# Patient Record
Sex: Male | Born: 1980 | Race: White | Hispanic: No | Marital: Single | Smoking: Current some day smoker
Health system: Southern US, Community
[De-identification: ages and names within clinical notes are randomized; demographics above are authoritative.]

## PROBLEM LIST (undated history)

## (undated) DIAGNOSIS — Z87442 Personal history of urinary calculi: Secondary | ICD-10-CM

## (undated) DIAGNOSIS — D649 Anemia, unspecified: Secondary | ICD-10-CM

## (undated) DIAGNOSIS — F1120 Opioid dependence, uncomplicated: Secondary | ICD-10-CM

## (undated) DIAGNOSIS — N200 Calculus of kidney: Secondary | ICD-10-CM

## (undated) DIAGNOSIS — F419 Anxiety disorder, unspecified: Secondary | ICD-10-CM

## (undated) DIAGNOSIS — M1990 Unspecified osteoarthritis, unspecified site: Secondary | ICD-10-CM

## (undated) DIAGNOSIS — F32A Depression, unspecified: Secondary | ICD-10-CM

## (undated) DIAGNOSIS — B1920 Unspecified viral hepatitis C without hepatic coma: Secondary | ICD-10-CM

## (undated) DIAGNOSIS — F329 Major depressive disorder, single episode, unspecified: Secondary | ICD-10-CM

## (undated) DIAGNOSIS — C801 Malignant (primary) neoplasm, unspecified: Secondary | ICD-10-CM

## (undated) DIAGNOSIS — R569 Unspecified convulsions: Secondary | ICD-10-CM

## (undated) HISTORY — DX: Malignant (primary) neoplasm, unspecified (CMS-HCC): C80.1

## (undated) HISTORY — DX: Anxiety disorder, unspecified: F41.9

## (undated) HISTORY — PX: OTHER SURGICAL HISTORY: SHX169

## (undated) HISTORY — DX: Unspecified viral hepatitis C without hepatic coma: B19.20

## (undated) HISTORY — PX: TONSILLECTOMY: SUR1361

## (undated) HISTORY — PX: APPENDECTOMY: SHX54

## (undated) HISTORY — DX: Opioid dependence, uncomplicated: F11.20

## (undated) HISTORY — PX: RADICAL NECK DISSECTION: SHX2284

## (undated) HISTORY — DX: Anemia, unspecified: D64.9

## (undated) HISTORY — DX: Unspecified osteoarthritis, unspecified site: M19.90

## (undated) HISTORY — DX: Depression, unspecified: F32.A

## (undated) HISTORY — DX: Major depressive disorder, single episode, unspecified: F32.9

---

## 2009-06-12 ENCOUNTER — Emergency Department (HOSPITAL_COMMUNITY): Admission: EM | Admit: 2009-06-12 | Discharge: 2009-06-12 | Payer: Self-pay | Admitting: Family Medicine

## 2009-07-13 ENCOUNTER — Emergency Department (HOSPITAL_COMMUNITY): Admission: EM | Admit: 2009-07-13 | Discharge: 2009-07-13 | Payer: Self-pay | Admitting: Emergency Medicine

## 2010-02-11 ENCOUNTER — Ambulatory Visit (HOSPITAL_COMMUNITY): Admission: RE | Admit: 2010-02-11 | Discharge: 2010-02-11 | Payer: Self-pay | Admitting: Orthopedic Surgery

## 2010-06-27 ENCOUNTER — Inpatient Hospital Stay (HOSPITAL_COMMUNITY): Admit: 2010-06-27 | Payer: Self-pay

## 2010-07-01 ENCOUNTER — Emergency Department (HOSPITAL_COMMUNITY)
Admission: EM | Admit: 2010-07-01 | Discharge: 2010-07-02 | Disposition: A | Discharge status: Other Acute Inpatient Hospital | Payer: Self-pay | Attending: Emergency Medicine | Admitting: Emergency Medicine

## 2010-07-01 DIAGNOSIS — F3289 Other specified depressive episodes: Secondary | ICD-10-CM | POA: Insufficient documentation

## 2010-07-01 DIAGNOSIS — F329 Major depressive disorder, single episode, unspecified: Secondary | ICD-10-CM | POA: Insufficient documentation

## 2010-07-01 DIAGNOSIS — R45851 Suicidal ideations: Secondary | ICD-10-CM | POA: Insufficient documentation

## 2010-07-01 LAB — CBC
MCHC: 35.9 g/dL (ref 30.0–36.0)
Platelets: 293 10*3/uL (ref 150–400)
RDW: 11.9 % (ref 11.5–15.5)
WBC: 11.9 10*3/uL — ABNORMAL HIGH (ref 4.0–10.5)

## 2010-07-01 LAB — RAPID URINE DRUG SCREEN, HOSP PERFORMED
Amphetamines: NOT DETECTED
Barbiturates: NOT DETECTED
Benzodiazepines: POSITIVE — AB
Cocaine: POSITIVE — AB
Opiates: NOT DETECTED
Tetrahydrocannabinol: NOT DETECTED

## 2010-07-01 LAB — BASIC METABOLIC PANEL
CO2: 25 mEq/L (ref 19–32)
Calcium: 9 mg/dL (ref 8.4–10.5)
Creatinine, Ser: 1.02 mg/dL (ref 0.4–1.5)
GFR calc Af Amer: >60 mL/min (ref >60)
GFR calc non Af Amer: >60 mL/min (ref >60)
Glucose, Bld: 100 mg/dL — ABNORMAL HIGH (ref 70–99)
Sodium: 139 mEq/L (ref 135–145)

## 2010-07-01 LAB — DIFFERENTIAL
Basophils Absolute: 0 10*3/uL (ref 0.0–0.1)
Eosinophils Absolute: 0 10*3/uL (ref 0.0–0.7)
Eosinophils Relative: 0 % (ref 0–5)
Monocytes Absolute: 0.6 10*3/uL (ref 0.1–1.0)

## 2010-07-02 ENCOUNTER — Inpatient Hospital Stay (HOSPITAL_COMMUNITY)
Admission: RE | Admit: 2010-07-02 | Discharge: 2010-07-05 | DRG: 897 | Disposition: A | Discharge status: Home / Self Care | Payer: PRIVATE HEALTH INSURANCE | Source: Ambulatory Visit | Attending: Psychiatry | Admitting: Psychiatry

## 2010-07-02 DIAGNOSIS — Z6379 Other stressful life events affecting family and household: Secondary | ICD-10-CM

## 2010-07-02 DIAGNOSIS — F329 Major depressive disorder, single episode, unspecified: Secondary | ICD-10-CM

## 2010-07-02 DIAGNOSIS — Z818 Family history of other mental and behavioral disorders: Secondary | ICD-10-CM

## 2010-07-02 DIAGNOSIS — F1420 Cocaine dependence, uncomplicated: Principal | ICD-10-CM

## 2010-07-02 DIAGNOSIS — F0630 Mood disorder due to known physiological condition, unspecified: Secondary | ICD-10-CM

## 2010-07-02 DIAGNOSIS — B1920 Unspecified viral hepatitis C without hepatic coma: Secondary | ICD-10-CM

## 2010-07-02 DIAGNOSIS — F1920 Other psychoactive substance dependence, uncomplicated: Secondary | ICD-10-CM

## 2010-07-02 DIAGNOSIS — F3289 Other specified depressive episodes: Secondary | ICD-10-CM

## 2010-07-02 DIAGNOSIS — F1910 Other psychoactive substance abuse, uncomplicated: Secondary | ICD-10-CM

## 2010-07-03 LAB — HEPATIC FUNCTION PANEL
AST: 32 U/L (ref 0–37)
Albumin: 3.7 g/dL (ref 3.5–5.2)
Bilirubin, Direct: 0.1 mg/dL (ref 0.0–0.3)
Total Bilirubin: 0.2 mg/dL — ABNORMAL LOW (ref 0.3–1.2)
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
## 2010-07-21 ENCOUNTER — Emergency Department (HOSPITAL_COMMUNITY)
Admission: EM | Admit: 2010-07-21 | Discharge: 2010-07-22 | Disposition: A | Discharge status: Other Acute Inpatient Hospital | Payer: PRIVATE HEALTH INSURANCE | Attending: Emergency Medicine | Admitting: Emergency Medicine

## 2010-07-21 DIAGNOSIS — F1910 Other psychoactive substance abuse, uncomplicated: Secondary | ICD-10-CM | POA: Insufficient documentation

## 2010-07-21 DIAGNOSIS — R45851 Suicidal ideations: Secondary | ICD-10-CM | POA: Insufficient documentation

## 2010-07-21 DIAGNOSIS — Z8619 Personal history of other infectious and parasitic diseases: Secondary | ICD-10-CM | POA: Insufficient documentation

## 2010-07-21 LAB — RAPID URINE DRUG SCREEN, HOSP PERFORMED
Amphetamines: POSITIVE — AB
Barbiturates: NOT DETECTED
Benzodiazepines: POSITIVE — AB
Cocaine: POSITIVE — AB
Opiates: NOT DETECTED
Tetrahydrocannabinol: NOT DETECTED

## 2010-07-21 LAB — CBC
HCT: 40.1 % (ref 39.0–52.0)
Hemoglobin: 14.1 g/dL (ref 13.0–17.0)
RBC: 4.59 MIL/uL (ref 4.22–5.81)
WBC: 7.4 10*3/uL (ref 4.0–10.5)

## 2010-07-21 LAB — COMPREHENSIVE METABOLIC PANEL
Albumin: 3.9 g/dL (ref 3.5–5.2)
Alkaline Phosphatase: 35 U/L — ABNORMAL LOW (ref 39–117)
BUN: 14 mg/dL (ref 6–23)
Calcium: 8.8 mg/dL (ref 8.4–10.5)
Creatinine, Ser: 1.32 mg/dL (ref 0.4–1.5)
Glucose, Bld: 85 mg/dL (ref 70–99)
Potassium: 4.1 mEq/L (ref 3.5–5.1)
Total Protein: 6.8 g/dL (ref 6.0–8.3)

## 2010-07-21 LAB — DIFFERENTIAL
Basophils Absolute: 0 10*3/uL (ref 0.0–0.1)
Basophils Relative: 1 % (ref 0–1)
Lymphocytes Relative: 24 % (ref 12–46)
Monocytes Absolute: 1.5 10*3/uL — ABNORMAL HIGH (ref 0.1–1.0)
Neutro Abs: 3.9 10*3/uL (ref 1.7–7.7)
Neutrophils Relative %: 53 % (ref 43–77)

## 2010-07-21 LAB — ETHANOL: Alcohol, Ethyl (B): <5 mg/dL (ref 0–10)

## 2010-07-22 ENCOUNTER — Inpatient Hospital Stay (HOSPITAL_COMMUNITY)
Admission: EM | Admit: 2010-07-22 | Discharge: 2010-07-25 | DRG: 897 | Disposition: A | Discharge status: Home / Self Care | Payer: PRIVATE HEALTH INSURANCE | Source: Intra-hospital | Attending: Psychiatry | Admitting: Psychiatry

## 2010-07-22 DIAGNOSIS — F603 Borderline personality disorder: Secondary | ICD-10-CM

## 2010-07-22 DIAGNOSIS — F1920 Other psychoactive substance dependence, uncomplicated: Secondary | ICD-10-CM

## 2010-07-22 DIAGNOSIS — F3289 Other specified depressive episodes: Secondary | ICD-10-CM

## 2010-07-22 DIAGNOSIS — Z818 Family history of other mental and behavioral disorders: Secondary | ICD-10-CM

## 2010-07-22 DIAGNOSIS — B1920 Unspecified viral hepatitis C without hepatic coma: Secondary | ICD-10-CM

## 2010-07-22 DIAGNOSIS — F1910 Other psychoactive substance abuse, uncomplicated: Secondary | ICD-10-CM

## 2010-07-22 DIAGNOSIS — R45851 Suicidal ideations: Secondary | ICD-10-CM

## 2010-07-22 DIAGNOSIS — F332 Major depressive disorder, recurrent severe without psychotic features: Secondary | ICD-10-CM

## 2010-07-22 DIAGNOSIS — F329 Major depressive disorder, single episode, unspecified: Secondary | ICD-10-CM

## 2010-07-22 DIAGNOSIS — F1020 Alcohol dependence, uncomplicated: Principal | ICD-10-CM

## 2010-07-22 DIAGNOSIS — Z6379 Other stressful life events affecting family and household: Secondary | ICD-10-CM

## 2010-07-24 NOTE — Consult Note (Signed)
  NAME:  [name], BURGARD             ACCOUNT NO.:  [id number]  MEDICAL RECORD NO.:  [id number]           PATIENT TYPE:  E  LOCATION:  WLED                         FACILITY:  [facility]  PHYSICIAN:  Eulogio Ditch, MD DATE OF BIRTH:  1980/11/20  DATE OF CONSULTATION:  07/22/2010 DATE OF DISCHARGE:                                CONSULTATION   REASON FOR CONSULTATION:  Depression and suicidal ideations.  HISTORY OF PRESENT ILLNESS:  I saw the patient, reviewed the medical records.  The patient is a 30 year old white male who reported depressed mood and cut himself on the forearm and got number of sutures.  The patient reported that he is depressed because of the drugs and recent broke off from the girlfriend.  The patient reported he is thinking of jumping off the bridge.  The patient is very irritable during the interview, unable to make a good eye contact.  Denies hearing any voices.  PAST PSYCHIATRIC HISTORY:  The patient has been admitted to the Behavioral Health in the past and was discharged on July 05, 2010, on Celexa 20 mg p.o. daily, Risperdal 0.5 mg t.i.d., and trazodone 100 mg at bedtime.  SUBSTANCE ABUSE HISTORY:  The patient is abusing cocaine, heroin, benzos.  MEDICAL HISTORY:  No active medical issue.  History of hepatitis C, appendectomy  ALLERGIES:  Allergic to CECLOR.  PHYSICAL EXAMINATION:  Physical examination done by Dr. Eber Hong within normal limits.  Vitals within normal limits.  LABORATORY DATA:  Within normal limits.  UDS is positive for cocaine, benzo, amphetamines.  MENTAL STATUS EXAMINATION:  The patient's eye contact was poor.  During the interview, the patient was fairly cooperative.  Hygiene, grooming poor.  No psychomotor agitation, retardation noted during interview.  No abnormal movements present.  Speech is soft, slow.  Mood depressed, irritable.  Affect mood congruent.  Thought process logical and goal directed, was not forthcoming  in providing the information.  Thought content suicidal, ideations present.  The patient cut himself on the forearm and also thinking about jumping from the bridge, not delusional. Thought perception, no audiovisual hallucination reported; not internally preoccupied.  Cognition, alert, awake, oriented x3.  Memory, immediate, recent and remote fair.  Attention and concentration poor. Abstract ability fair.  Insight and judgment poor.  DIAGNOSIS:  AXIS I:  Polysubstance abuse/dependence, depressive disorder, not otherwise specified. AXIS II:  Deferred. AXIS III:  Hepatitis C. AXIS IV:  Polysubstance abuse. AXIS V:  30 to 40  RECOMMENDATIONS: 1. The patient will be started on Celexa 20 mg p.o. daily, trazodone     100 mg at bedtime and Risperdal 0.25 mg t.i.d. 2. The patient will be admitted to [facility] for further     stabilization. 3. The patient agrees with admission to the [facility]     Eulogio Ditch, MD     SA/MEDQ  D:  07/22/2010  T:  07/22/2010  Job:  [id number]  Electronically Signed by Eulogio Ditch  on 07/24/2010 11:51:14 AM
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
## 2010-07-26 NOTE — H&P (Signed)
NAME:  [name], [name] NO.:  [id number]  MEDICAL RECORD NO.:  [id number]           PATIENT TYPE:  I  LOCATION:  0307                          FACILITY:  [facility]  PHYSICIAN:  Anselm Jungling, MD  DATE OF BIRTH:  04-Mar-1981  DATE OF ADMISSION:  07/22/2010 DATE OF DISCHARGE:                      PSYCHIATRIC ADMISSION ASSESSMENT   This is a voluntary admission to the services of Dr. Geralyn Flash. This is a 30 year old single white male.  He was recently here in the [facility].  He was admitted March 18 to March 21. Yesterday a mobile health assessment was made as the patient had called reporting that he had relapsed on cocaine.  He was reporting suicidal thoughts and behaviors with a plan to jump off a bridge.  He also had cut his arm and he does attend groups at [facility] and believes he needs a more intensive program.  Hence, he was brought to [facility]. He was seen in consultation in the emergency room by Dr. Rogers Blocker.  Dr. Rogers Blocker reviewed his medical records.  He inspected the patient's forearms were he got a number of sutures and the patient told Dr. Rogers Blocker that he was depressed because of the drugs and recent breakup by his girlfriend because of his drug usage.  He reported that he was still thinking of jumping off a bridge, he was very irritable, unable to make good eye contact and denied hearing any voices.  He has just gotten to the unit this morning.  He is in bed.  He is still endorsing severe depression.  PAST PSYCHIATRIC HISTORY:  Apparently he began treatment when he was a youth.  His first psychiatric admission was as a juvenile for cutting. He has had 2 substance abuse rehabs in [place], 2 in [place] and his second psychiatric admission was for a drug-induced psychosis.  He did manage to get clean after a 28-day program.  However, he has had several relapses this year.  SOCIAL HISTORY:  His fiance just broke her  engagement with him due to his substance abuse.  He has a GED.  He is employed at a Social research officer, government.  He has no legal problems but he has had prison time for 4-1/2 years for fraud in [place].  His release date was July 22, 2008.  He has no military history.  FAMILY HISTORY:  His mother uses drugs.  She is in [place].  His sister is in [place].  On his mother's side there is a long history for substance abuse and incarceration.  On his father's side he believes there may be some bipolar.  His own alcohol and drug history began as a teenager by using pot which then progressed into huffing different substances including cocaine and crystal meth, heroin IV and cocaine.  MEDICAL PROBLEMS:  He is thought to have hepatitis C.  MEDICATIONS:  His medications at the time of discharge at the end of March, he was discharged on Celexa 20 mg p.o. daily, trazodone 100 at bedtime and Risperdal 0.25 mg t.i.d.  DRUG ALLERGIES:  He has an allergy to CECLOR.  POSITIVE PHYSICAL FINDINGS:  He was medically  cleared in the ED at [facility]. VITAL SIGNS:  He was afebrile.  His temperature was 97.4 to 97.8.  His pulse was 64-100, respirations were 16-20, blood pressure was 101/56 to 118/81.  UDS was positive for cocaine, benzodiazepines and amphetamines. He had no marked abnormalities of CBC or metabolic profile and he had no measurable alcohol.  MENTAL STATUS EXAM:  He was seen in his room in bed.  He reports feeling quite depressed.  He is still suicidal.  His mood and affect are depressed.  Thought processes are clear, logical, goal-directed.  He wants to get back on medications.  He wants to have help detoxing.  He has already acted on his suicidal ideation by cutting on himself.  He did not jump from a bridge.  He is no longer delusional.  He has passive SI at this time.  DIAGNOSES:  AXIS I:  Polysubstance abuse/dependence, depressive disorder, not otherwise specified. AXIS II:  Borderline  personality disorder. AXIS III:  Hepatitis C. AXIS IV:  Polysubstance abuse. AXIS V:  30-40.  PLAN:  Restart the Celexa, trazodone and Risperdal.  He will attend the dual diagnosis program in the hospital.  ESTIMATED LENGTH OF STAY:  Three to 5 days.     [name], P.A.-C.   ______________________________ Anselm Jungling, MD    MD/MEDQ  D:  07/22/2010  T:  07/22/2010  Job:  [id number]  Electronically Signed by Jaci Lazier ADAMS P.A.-C. on 07/25/2010 04:46:25 PM Electronically Signed by Geralyn Flash MD on 07/26/2010 10:49:06 AM

## 2010-07-27 NOTE — Discharge Summary (Signed)
  NAMEMarland Kitchen  [name], [name] NO.:  [id number]  MEDICAL RECORD NO.:  [id number]           PATIENT TYPE:  I  LOCATION:  0307                          FACILITY:  BH  PHYSICIAN:  Anselm Jungling, MD  DATE OF BIRTH:  1980/07/11  DATE OF ADMISSION:  07/22/2010 DATE OF DISCHARGE:  07/25/2010                              DISCHARGE SUMMARY   IDENTIFYING DATA/REASON FOR ADMISSION:  This was a readmission for [name], a 30 year old single Caucasian male who returned to [place] for treatment of alcohol dependence.  Please refer to the admission note for further details pertaining to the symptoms, circumstances and history that led to his hospitalization.  He was given an initial Axis I diagnosis of alcohol dependence.  MEDICAL AND LABORATORY:  The patient was medically and physically assessed by the psychiatric nurse practitioner.  He was in good health without any active or chronic medical problems.  There were no significant medical issues.  HOSPITAL COURSE:  The patient was admitted to the Adult Inpatient Psychiatric Service.  He presented as a well-nourished, normally developed adult male who readily discussed his relapse, which followed fairly quickly his last discharge from our program earlier in the month. He was continued on his same regimen of Celexa and Risperdal, with trazodone for sleep.  He underwent Librium protocol for detoxification. He participated in therapeutic groups and activities geared towards 12- step recovery.  He worked closely with case management, and on the fourth hospital day had completed his detox and was appropriate for transfer to an appropriate rehab facility.  He agreed to the following:  The patient was to go to the [facility] program for 14-28 days of inpatient alcohol rehab.  DISCHARGE MEDICATIONS: 1. Celexa 20 mg daily. 2. Risperdal 0.5 mg t.i.d. 3. Trazodone 150 mg q.h.s.  DISCHARGE DIAGNOSES:  Axis I: 1. Alcohol dependence, not  otherwise specified. 2. Depressive disorder, not otherwise specified. Axis II:  Deferred. Axis III:  No acute or chronic illnesses. Axis IV:  Stressors severe. Axis V:  Global Assessment of Functioning on discharge, 50.     Anselm Jungling, MD     SPB/MEDQ  D:  07/26/2010  T:  07/27/2010  Job:  [id number]  Electronically Signed by Geralyn Flash MD on 07/27/2010 09:42:48 AM

## 2010-08-02 ENCOUNTER — Ambulatory Visit (HOSPITAL_COMMUNITY)
Admission: RE | Admit: 2010-08-02 | Discharge: 2010-08-02 | Disposition: A | Discharge status: Home / Self Care | Payer: PRIVATE HEALTH INSURANCE | Attending: Psychiatry | Admitting: Psychiatry

## 2010-08-02 DIAGNOSIS — F1920 Other psychoactive substance dependence, uncomplicated: Secondary | ICD-10-CM | POA: Insufficient documentation

## 2010-08-04 ENCOUNTER — Other Ambulatory Visit (HOSPITAL_COMMUNITY): Payer: PRIVATE HEALTH INSURANCE | Attending: Psychiatry | Admitting: Psychiatry

## 2010-08-04 DIAGNOSIS — F329 Major depressive disorder, single episode, unspecified: Secondary | ICD-10-CM | POA: Insufficient documentation

## 2010-08-04 DIAGNOSIS — F1020 Alcohol dependence, uncomplicated: Secondary | ICD-10-CM | POA: Insufficient documentation

## 2010-08-04 DIAGNOSIS — F3289 Other specified depressive episodes: Secondary | ICD-10-CM | POA: Insufficient documentation

## 2010-08-07 ENCOUNTER — Other Ambulatory Visit (HOSPITAL_COMMUNITY): Payer: PRIVATE HEALTH INSURANCE | Admitting: Psychiatry

## 2010-08-09 ENCOUNTER — Other Ambulatory Visit (HOSPITAL_COMMUNITY): Payer: PRIVATE HEALTH INSURANCE | Admitting: Psychiatry

## 2010-08-11 ENCOUNTER — Other Ambulatory Visit (HOSPITAL_COMMUNITY): Payer: PRIVATE HEALTH INSURANCE | Admitting: Psychiatry

## 2010-08-14 ENCOUNTER — Other Ambulatory Visit (HOSPITAL_COMMUNITY): Payer: PRIVATE HEALTH INSURANCE | Admitting: Psychiatry

## 2010-08-16 ENCOUNTER — Other Ambulatory Visit (HOSPITAL_COMMUNITY): Payer: PRIVATE HEALTH INSURANCE | Attending: Psychiatry | Admitting: Psychiatry

## 2010-08-16 DIAGNOSIS — F3289 Other specified depressive episodes: Secondary | ICD-10-CM | POA: Insufficient documentation

## 2010-08-16 DIAGNOSIS — F1020 Alcohol dependence, uncomplicated: Secondary | ICD-10-CM | POA: Insufficient documentation

## 2010-08-16 DIAGNOSIS — F329 Major depressive disorder, single episode, unspecified: Secondary | ICD-10-CM | POA: Insufficient documentation

## 2010-08-18 ENCOUNTER — Other Ambulatory Visit (HOSPITAL_COMMUNITY): Payer: PRIVATE HEALTH INSURANCE | Admitting: Psychiatry

## 2010-08-21 ENCOUNTER — Other Ambulatory Visit (HOSPITAL_COMMUNITY): Payer: PRIVATE HEALTH INSURANCE | Admitting: Psychiatry

## 2010-08-23 ENCOUNTER — Other Ambulatory Visit (HOSPITAL_COMMUNITY): Payer: PRIVATE HEALTH INSURANCE | Admitting: Psychiatry

## 2010-08-25 ENCOUNTER — Other Ambulatory Visit (HOSPITAL_COMMUNITY): Payer: Self-pay | Admitting: Psychiatry

## 2010-08-25 ENCOUNTER — Other Ambulatory Visit (HOSPITAL_COMMUNITY): Payer: PRIVATE HEALTH INSURANCE | Admitting: Psychiatry

## 2010-08-26 LAB — URINE DRUGS OF ABUSE SCREEN W ALC, ROUTINE (REF LAB)
Amphetamine Screen, Ur: NEGATIVE
Barbiturate Quant, Ur: NEGATIVE
Creatinine,U: 85.6 mg/dL
Ethyl Alcohol: <10 mg/dL (ref <10)
Phencyclidine (PCP): NEGATIVE

## 2010-08-28 ENCOUNTER — Other Ambulatory Visit (HOSPITAL_COMMUNITY): Payer: PRIVATE HEALTH INSURANCE | Admitting: Psychiatry

## 2010-08-30 ENCOUNTER — Other Ambulatory Visit (HOSPITAL_COMMUNITY): Payer: PRIVATE HEALTH INSURANCE | Admitting: Psychiatry

## 2010-09-01 ENCOUNTER — Other Ambulatory Visit (HOSPITAL_COMMUNITY): Payer: PRIVATE HEALTH INSURANCE | Admitting: Psychiatry

## 2010-09-04 ENCOUNTER — Other Ambulatory Visit (HOSPITAL_COMMUNITY): Payer: PRIVATE HEALTH INSURANCE | Admitting: Psychiatry

## 2010-09-04 ENCOUNTER — Other Ambulatory Visit (HOSPITAL_COMMUNITY): Payer: Self-pay | Admitting: Psychiatry

## 2010-09-05 LAB — URINE DRUGS OF ABUSE SCREEN W ALC, ROUTINE (REF LAB)
Amphetamine Screen, Ur: NEGATIVE
Barbiturate Quant, Ur: NEGATIVE
Cocaine Metabolites: NEGATIVE
Creatinine,U: 95.4 mg/dL
Ethyl Alcohol: <10 mg/dL (ref <10)
Phencyclidine (PCP): NEGATIVE

## 2010-09-06 ENCOUNTER — Other Ambulatory Visit (HOSPITAL_COMMUNITY): Payer: Self-pay | Admitting: Psychiatry

## 2010-09-06 ENCOUNTER — Other Ambulatory Visit (HOSPITAL_COMMUNITY): Payer: PRIVATE HEALTH INSURANCE | Admitting: Psychiatry

## 2010-09-07 LAB — URINE DRUGS OF ABUSE SCREEN W ALC, ROUTINE (REF LAB)
Amphetamine Screen, Ur: NEGATIVE
Barbiturate Quant, Ur: NEGATIVE
Cocaine Metabolites: NEGATIVE
Creatinine,U: 70.5 mg/dL
Ethyl Alcohol: <10 mg/dL (ref <10)
Phencyclidine (PCP): NEGATIVE

## 2010-09-08 ENCOUNTER — Other Ambulatory Visit (HOSPITAL_COMMUNITY): Payer: PRIVATE HEALTH INSURANCE | Admitting: Psychiatry

## 2010-09-12 ENCOUNTER — Other Ambulatory Visit (HOSPITAL_COMMUNITY): Payer: PRIVATE HEALTH INSURANCE | Attending: Psychiatry | Admitting: Psychiatry

## 2010-09-12 DIAGNOSIS — F3289 Other specified depressive episodes: Secondary | ICD-10-CM | POA: Insufficient documentation

## 2010-09-12 DIAGNOSIS — F329 Major depressive disorder, single episode, unspecified: Secondary | ICD-10-CM | POA: Insufficient documentation

## 2010-09-12 DIAGNOSIS — F1020 Alcohol dependence, uncomplicated: Secondary | ICD-10-CM | POA: Insufficient documentation

## 2010-09-13 ENCOUNTER — Other Ambulatory Visit (HOSPITAL_COMMUNITY): Payer: PRIVATE HEALTH INSURANCE | Admitting: Psychiatry

## 2010-09-13 ENCOUNTER — Other Ambulatory Visit (HOSPITAL_COMMUNITY): Payer: Self-pay | Admitting: Psychiatry

## 2010-09-14 LAB — DRUGS OF ABUSE SCREEN W/O ALC, ROUTINE URINE
Cocaine Metabolites: NEGATIVE
Creatinine,U: 80.9 mg/dL
Opiate Screen, Urine: NEGATIVE

## 2010-09-15 ENCOUNTER — Other Ambulatory Visit (HOSPITAL_COMMUNITY): Payer: PRIVATE HEALTH INSURANCE | Attending: Psychiatry | Admitting: Psychiatry

## 2010-09-15 DIAGNOSIS — F329 Major depressive disorder, single episode, unspecified: Secondary | ICD-10-CM | POA: Insufficient documentation

## 2010-09-15 DIAGNOSIS — F1020 Alcohol dependence, uncomplicated: Secondary | ICD-10-CM | POA: Insufficient documentation

## 2010-09-15 DIAGNOSIS — F3289 Other specified depressive episodes: Secondary | ICD-10-CM | POA: Insufficient documentation

## 2010-09-18 ENCOUNTER — Other Ambulatory Visit (HOSPITAL_COMMUNITY): Payer: PRIVATE HEALTH INSURANCE | Admitting: Psychiatry

## 2010-09-20 ENCOUNTER — Other Ambulatory Visit (HOSPITAL_COMMUNITY): Payer: PRIVATE HEALTH INSURANCE | Admitting: Psychiatry

## 2010-09-22 ENCOUNTER — Other Ambulatory Visit (HOSPITAL_COMMUNITY): Payer: PRIVATE HEALTH INSURANCE | Admitting: Psychiatry

## 2010-09-25 ENCOUNTER — Other Ambulatory Visit (HOSPITAL_COMMUNITY): Payer: PRIVATE HEALTH INSURANCE | Admitting: Psychiatry

## 2010-09-27 ENCOUNTER — Other Ambulatory Visit (HOSPITAL_COMMUNITY): Payer: PRIVATE HEALTH INSURANCE | Admitting: Psychiatry

## 2010-09-29 ENCOUNTER — Other Ambulatory Visit (HOSPITAL_COMMUNITY): Payer: PRIVATE HEALTH INSURANCE | Attending: Psychiatry | Admitting: Psychiatry

## 2010-09-29 DIAGNOSIS — F3289 Other specified depressive episodes: Secondary | ICD-10-CM | POA: Insufficient documentation

## 2010-09-29 DIAGNOSIS — F329 Major depressive disorder, single episode, unspecified: Secondary | ICD-10-CM | POA: Insufficient documentation

## 2010-09-29 DIAGNOSIS — F1020 Alcohol dependence, uncomplicated: Secondary | ICD-10-CM | POA: Insufficient documentation

## 2010-10-02 ENCOUNTER — Other Ambulatory Visit (HOSPITAL_COMMUNITY): Payer: PRIVATE HEALTH INSURANCE | Admitting: Psychiatry

## 2010-10-04 ENCOUNTER — Other Ambulatory Visit (HOSPITAL_COMMUNITY): Payer: PRIVATE HEALTH INSURANCE | Admitting: Psychiatry

## 2010-10-06 ENCOUNTER — Other Ambulatory Visit (HOSPITAL_COMMUNITY): Payer: PRIVATE HEALTH INSURANCE | Admitting: Psychiatry

## 2010-10-06 ENCOUNTER — Other Ambulatory Visit (HOSPITAL_COMMUNITY): Payer: Self-pay | Admitting: Psychiatry

## 2010-10-07 LAB — URINE DRUGS OF ABUSE SCREEN W ALC, ROUTINE (REF LAB)
Amphetamine Screen, Ur: NEGATIVE
Creatinine,U: 197.8 mg/dL
Ethyl Alcohol: <10 mg/dL (ref <10)
Marijuana Metabolite: NEGATIVE
Opiate Screen, Urine: NEGATIVE

## 2010-10-09 ENCOUNTER — Other Ambulatory Visit (HOSPITAL_COMMUNITY): Payer: PRIVATE HEALTH INSURANCE | Admitting: Psychiatry

## 2010-10-11 ENCOUNTER — Other Ambulatory Visit (HOSPITAL_COMMUNITY): Payer: PRIVATE HEALTH INSURANCE | Admitting: Psychiatry

## 2010-12-25 ENCOUNTER — Ambulatory Visit (HOSPITAL_COMMUNITY): Payer: PRIVATE HEALTH INSURANCE | Admitting: Psychiatry

## 2012-08-15 ENCOUNTER — Other Ambulatory Visit (INDEPENDENT_AMBULATORY_CARE_PROVIDER_SITE_OTHER): Payer: BC Managed Care – PPO

## 2012-08-15 ENCOUNTER — Ambulatory Visit (INDEPENDENT_AMBULATORY_CARE_PROVIDER_SITE_OTHER): Payer: BC Managed Care – PPO | Admitting: Gastroenterology

## 2012-08-15 ENCOUNTER — Encounter: Payer: Self-pay | Admitting: Gastroenterology

## 2012-08-15 ENCOUNTER — Other Ambulatory Visit: Payer: Self-pay

## 2012-08-15 VITALS — BP 110/70 | HR 72 | Ht 67.5 in | Wt 164.0 lb

## 2012-08-15 DIAGNOSIS — R194 Change in bowel habit: Secondary | ICD-10-CM

## 2012-08-15 DIAGNOSIS — K625 Hemorrhage of anus and rectum: Secondary | ICD-10-CM

## 2012-08-15 DIAGNOSIS — R198 Other specified symptoms and signs involving the digestive system and abdomen: Secondary | ICD-10-CM

## 2012-08-15 LAB — CBC WITH DIFFERENTIAL/PLATELET
Basophils Relative: 1 % (ref 0.0–3.0)
Eosinophils Absolute: 0.2 10*3/uL (ref 0.0–0.7)
Eosinophils Relative: 2.6 % (ref 0.0–5.0)
Lymphocytes Relative: 36 % (ref 12.0–46.0)
MCHC: 34.5 g/dL (ref 30.0–36.0)
Monocytes Relative: 8.2 % (ref 3.0–12.0)
Neutrophils Relative %: 52.2 % (ref 43.0–77.0)
RBC: 4.85 Mil/uL (ref 4.22–5.81)
WBC: 7.8 10*3/uL (ref 4.5–10.5)

## 2012-08-15 MED ORDER — MOVIPREP 100 G PO SOLR: 1.0000 | Freq: Once | ORAL | Status: DC

## 2012-08-15 NOTE — Patient Instructions (Addendum)
You will have labs checked today in the basement lab.  Please head down after you check out with the front desk  (cbc, cmet, esr) You will be set up for a colonoscopy for changes in bowels, minor bleeding (LEC, MAC sedation).                                               We are excited to introduce MyChart, a new best-in-class service that provides you online access to important information in your electronic medical record. We want to make it easier for you to view your health information - all in one secure location - when and where you need it. We expect MyChart will enhance the quality of care and service we provide.  When you register for MyChart, you can:    View your test results.    Request appointments and receive appointment reminders via email.    Request medication renewals.    View your medical history, allergies, medications and immunizations.    Communicate with your physician's office through a password-protected site.    Conveniently print information such as your medication lists.  To find out if MyChart is right for you, please talk to a member of our clinical staff today. We will gladly answer your questions about this free health and wellness tool.  If you are age 30 or older and want a member of your family to have access to your record, you must provide written consent by completing a proxy form available at our office. Please speak to our clinical staff about guidelines regarding accounts for patients younger than age 30.  As you activate your MyChart account and need any technical assistance, please call the MyChart technical support line at [phone]) 83-CHART [phone]) or email your question to [email or web address]. If you email your question(s), please include your name, a return phone number and the best time to reach you.  If you have non-urgent health-related questions, you can send a message to our office through MyChart at [email or web address]. If  you have a medical emergency, call 911.  Thank you for using MyChart as your new health and wellness resource!   MyChart licensed from [employer],  4540-9811. Patents Pending.

## 2012-08-15 NOTE — Progress Notes (Signed)
HPI: This is a   very pleasant 32 year old man whom I am meeting for the first time today.  Thought he had parasites.  Mucous like.  He has also seen blood in his stool.  Overall weight is stable.  CAn have intemrittent constipation, usually causes pains.    Does occasionally have diarrhea.  He is very nervous about possibility of cancer.  His dad died one to 2 years ago very shortly after being diagnosed with metastatic renal cancer.  He has abused many substances in the past but has been sober for sounds like a year or 2 now.  He is engaged  Review of systems: Pertinent positive and negative review of systems were noted in the above HPI section. Complete review of systems was performed and was otherwise normal.    Past Medical History  Diagnosis Date  . Opiate dependence   . Hepatitis C   . Anxiety   . Depression     Past Surgical History  Procedure Laterality Date  . Appendectomy      Current Outpatient Prescriptions  Medication Sig Dispense Refill  . buprenorphine-naloxone (SUBOXONE) 8-2 MG SUBL Place 1 tablet under the tongue daily.       No current facility-administered medications for this visit.    Allergies as of 08/15/2012 - Review Complete 08/15/2012  Allergen Reaction Noted  . Ceclor (cefaclor)  08/15/2012    Family History  Problem Relation Age of Onset  . Kidney cancer Father     renal cell carcenoma  . Hodgkin's lymphoma Mother   . Colon cancer Paternal Grandfather     great  . Lung cancer Paternal Grandmother     History   Social History  . Marital Status: Single    Spouse Name: N/A    Number of Children: 0  . Years of Education: N/A   Occupational History  . Curator    Social History Main Topics  . Smoking status: Former Smoker    Types: Cigarettes    Quit date: 04/16/2008  . Smokeless tobacco: Former Neurosurgeon    Types: Chew    Quit date: 04/16/2008  . Alcohol Use: Yes     Comment: 1 per day  . Drug Use: No     Comment: former  user  . Sexually Active: Not on file   Other Topics Concern  . Not on file   Social History Narrative  . No narrative on file       Physical Exam: BP 110/70  Pulse 72  Ht 5' 7.5" (1.715 m)  Wt 164 lb (74.39 kg)  BMI 25.29 kg/m2 Constitutional: generally well-appearing Psychiatric: alert and oriented x3 Eyes: extraocular movements intact Mouth: oral pharynx moist, no lesions Neck: supple no lymphadenopathy Cardiovascular: heart regular rate and rhythm Lungs: clear to auscultation bilaterally Abdomen: soft, nontender, nondistended, no obvious ascites, no peritoneal signs, normal bowel sounds Extremities: no lower extremity edema bilaterally Skin: no lesions on visible extremities    Assessment and plan: 32 y.o. male with  change in bowels, minor rectal bleeding  A c with full colonoscopy at his soonest convenience. He is very nervous about the possibility of colon cancer after seeing his father died of cancer 2 years ago. Negative basic set of labs today as well including CBC, complete metabolic profile and sedimentation rate.

## 2012-08-18 LAB — COMPREHENSIVE METABOLIC PANEL
Albumin: 4.3 g/dL (ref 3.5–5.2)
Alkaline Phosphatase: 60 U/L (ref 39–117)
Glucose, Bld: 77 mg/dL (ref 70–99)
Potassium: 4.4 mEq/L (ref 3.5–5.1)
Sodium: 135 mEq/L (ref 135–145)
Total Protein: 7.4 g/dL (ref 6.0–8.3)

## 2012-09-19 ENCOUNTER — Encounter: Payer: Self-pay | Admitting: Gastroenterology

## 2012-09-19 ENCOUNTER — Ambulatory Visit (AMBULATORY_SURGERY_CENTER): Payer: BC Managed Care – PPO | Admitting: Gastroenterology

## 2012-09-19 VITALS — BP 130/71 | HR 62 | Temp 97.7°F | Resp 16 | Ht 67.0 in | Wt 164.0 lb

## 2012-09-19 DIAGNOSIS — R198 Other specified symptoms and signs involving the digestive system and abdomen: Secondary | ICD-10-CM

## 2012-09-19 DIAGNOSIS — K625 Hemorrhage of anus and rectum: Secondary | ICD-10-CM

## 2012-09-19 MED ORDER — SODIUM CHLORIDE 0.9 % IV SOLN: 500.0000 mL | INTRAVENOUS | Status: DC

## 2012-09-19 NOTE — Progress Notes (Signed)
Procedure ends, to recovery, report given and VSS. 

## 2012-09-19 NOTE — Op Note (Signed)
[facility] [address] [place], [address]   COLONOSCOPY PROCEDURE REPORT  PATIENT: Damon, Young  MR#: [id number] BIRTHDATE: 06/20/1980 , 31  yrs. old GENDER: Male ENDOSCOPIST: Rachael Fee, MD PROCEDURE DATE:  09/19/2012 PROCEDURE:   Colonoscopy, diagnostic ASA CLASS:   Class III INDICATIONS:change in bowels, minor rectal bleeding. MEDICATIONS: Propofol (Diprivan) 320 mg IV  DESCRIPTION OF PROCEDURE:   After the risks benefits and alternatives of the procedure were thoroughly explained, informed consent was obtained.  A digital rectal exam revealed no abnormalities of the rectum.   The LB [id number]  endoscope was introduced through the anus and advanced to the cecum, which was identified by both the appendix and ileocecal valve. No adverse events experienced.   The quality of the prep was good.  The instrument was then slowly withdrawn as the colon was fully examined.     COLON FINDINGS: A normal appearing cecum, ileocecal valve, and appendiceal orifice were identified.  The ascending, hepatic flexure, transverse, splenic flexure, descending, sigmoid colon and rectum appeared unremarkable.  No polyps or cancers were seen. Retroflexed views revealed no abnormalities. The time to cecum=5 minutes 30 seconds.  Withdrawal time=7 minutes 38 seconds.  The scope was withdrawn and the procedure completed. COMPLICATIONS: There were no complications.  ENDOSCOPIC IMPRESSION: Normal colon  RECOMMENDATIONS: Call Dr.  Christella Hartigan with any further GI questions or concerns.   eSigned:  Rachael Fee, MD 09/19/2012 1:47 PM

## 2012-09-19 NOTE — Patient Instructions (Addendum)
YOU HAD AN ENDOSCOPIC PROCEDURE TODAY AT THE [facility]: Refer to the procedure report that was given to you for any specific questions about what was found during the examination.  If the procedure report does not answer your questions, please call your gastroenterologist to clarify.  If you requested that your care partner not be given the details of your procedure findings, then the procedure report has been included in a sealed envelope for you to review at your convenience later.  YOU SHOULD EXPECT: Some feelings of bloating in the abdomen. Passage of more gas than usual.  Walking can help get rid of the air that was put into your GI tract during the procedure and reduce the bloating. If you had a lower endoscopy (such as a colonoscopy or flexible sigmoidoscopy) you may notice spotting of blood in your stool or on the toilet paper. If you underwent a bowel prep for your procedure, then you may not have a normal bowel movement for a few days.  DIET: Your first meal following the procedure should be a light meal and then it is ok to progress to your normal diet.  A half-sandwich or bowl of soup is an example of a good first meal.  Heavy or fried foods are harder to digest and may make you feel nauseous or bloated.  Likewise meals heavy in dairy and vegetables can cause extra gas to form and this can also increase the bloating.  Drink plenty of fluids but you should avoid alcoholic beverages for 24 hours.  ACTIVITY: Your care partner should take you home directly after the procedure.  You should plan to take it easy, moving slowly for the rest of the day.  You can resume normal activity the day after the procedure however you should NOT DRIVE or use heavy machinery for 24 hours (because of the sedation medicines used during the test).    SYMPTOMS TO REPORT IMMEDIATELY: A gastroenterologist can be reached at any hour.  During normal business hours, 8:30 AM to 5:00 PM Monday through Friday,  call [phone].  After hours and on weekends, please call the GI answering service at [phone] who will take a message and have the physician on call contact you.   Following lower endoscopy (colonoscopy or flexible sigmoidoscopy):  Excessive amounts of blood in the stool  Significant tenderness or worsening of abdominal pains  Swelling of the abdomen that is new, acute  Fever of 100F or higher    FOLLOW UP: If any biopsies were taken you will be contacted by phone or by letter within the next 1-3 weeks.  Call your gastroenterologist if you have not heard about the biopsies in 3 weeks.  Our staff will call the home number listed on your records the next business day following your procedure to check on you and address any questions or concerns that you may have at that time regarding the information given to you following your procedure. This is a courtesy call and so if there is no answer at the home number and we have not heard from you through the emergency physician on call, we will assume that you have returned to your regular daily activities without incident.  SIGNATURES/CONFIDENTIALITY: You and/or your care partner have signed paperwork which will be entered into your electronic medical record.  These signatures attest to the fact that that the information above on your After Visit Summary has been reviewed and is understood.  Full responsibility of the confidentiality   of this discharge information lies with you and/or your care-partner.     

## 2012-09-19 NOTE — Progress Notes (Signed)
Patient did not have preoperative order for IV antibiotic SSI prophylaxis. (G8918)  Patient did not experience any of the following events: a burn prior to discharge; a fall within the facility; wrong site/side/patient/procedure/implant event; or a hospital transfer or hospital admission upon discharge from the facility. (G8907)  

## 2012-09-22 ENCOUNTER — Telehealth: Payer: Self-pay | Admitting: *Deleted

## 2012-09-22 NOTE — Telephone Encounter (Signed)
  Follow up Call-  Call back number 09/19/2012  Post procedure Call Back phone  # [phone]  Permission to leave phone message Yes     Patient questions:  Do you have a fever, pain , or abdominal swelling? no Pain Score  0 *  Have you tolerated food without any problems? yes  Have you been able to return to your normal activities? yes  Do you have any questions about your discharge instructions: Diet   no Medications  no Follow up visit  no  Do you have questions or concerns about your Care? no  Actions: * If pain score is 4 or above: No action needed, pain <4.

## 2013-05-20 ENCOUNTER — Encounter (HOSPITAL_COMMUNITY): Payer: Self-pay | Admitting: Emergency Medicine

## 2013-05-20 ENCOUNTER — Emergency Department (HOSPITAL_COMMUNITY): Payer: BC Managed Care – PPO

## 2013-05-20 ENCOUNTER — Emergency Department (HOSPITAL_COMMUNITY)
Admission: EM | Admit: 2013-05-20 | Discharge: 2013-05-20 | Disposition: A | Discharge status: Home / Self Care | Payer: BC Managed Care – PPO | Attending: Emergency Medicine | Admitting: Emergency Medicine

## 2013-05-20 DIAGNOSIS — Z8659 Personal history of other mental and behavioral disorders: Secondary | ICD-10-CM | POA: Insufficient documentation

## 2013-05-20 DIAGNOSIS — N200 Calculus of kidney: Secondary | ICD-10-CM

## 2013-05-20 DIAGNOSIS — Z79899 Other long term (current) drug therapy: Secondary | ICD-10-CM | POA: Insufficient documentation

## 2013-05-20 DIAGNOSIS — Z9089 Acquired absence of other organs: Secondary | ICD-10-CM | POA: Insufficient documentation

## 2013-05-20 DIAGNOSIS — Z87891 Personal history of nicotine dependence: Secondary | ICD-10-CM | POA: Insufficient documentation

## 2013-05-20 DIAGNOSIS — Z8619 Personal history of other infectious and parasitic diseases: Secondary | ICD-10-CM | POA: Insufficient documentation

## 2013-05-20 HISTORY — DX: Calculus of kidney: N20.0

## 2013-05-20 LAB — BASIC METABOLIC PANEL
BUN: 18 mg/dL (ref 6–23)
CO2: 20 mEq/L (ref 19–32)
Calcium: 9.4 mg/dL (ref 8.4–10.5)
Chloride: 100 mEq/L (ref 96–112)
Creatinine, Ser: 0.91 mg/dL (ref 0.50–1.35)
GFR calc Af Amer: >90 mL/min (ref >90)
Glucose, Bld: 129 mg/dL — ABNORMAL HIGH (ref 70–99)
Potassium: 3.2 mEq/L — ABNORMAL LOW (ref 3.7–5.3)
SODIUM: 139 meq/L (ref 137–147)

## 2013-05-20 LAB — CBC WITH DIFFERENTIAL/PLATELET
BASOS ABS: 0 10*3/uL (ref 0.0–0.1)
Basophils Relative: 0 % (ref 0–1)
Eosinophils Absolute: 0.1 10*3/uL (ref 0.0–0.7)
Eosinophils Relative: 1 % (ref 0–5)
HCT: 40 % (ref 39.0–52.0)
Hemoglobin: 14.8 g/dL (ref 13.0–17.0)
Lymphocytes Relative: 39 % (ref 12–46)
Lymphs Abs: 3.6 10*3/uL (ref 0.7–4.0)
MCH: 31 pg (ref 26.0–34.0)
MCHC: 37 g/dL — AB (ref 30.0–36.0)
MCV: 83.7 fL (ref 78.0–100.0)
Monocytes Absolute: 0.6 10*3/uL (ref 0.1–1.0)
Monocytes Relative: 6 % (ref 3–12)
Neutro Abs: 5.1 10*3/uL (ref 1.7–7.7)
Neutrophils Relative %: 54 % (ref 43–77)
PLATELETS: 346 10*3/uL (ref 150–400)
RBC: 4.78 MIL/uL (ref 4.22–5.81)
RDW: 12.2 % (ref 11.5–15.5)
WBC: 9.4 10*3/uL (ref 4.0–10.5)

## 2013-05-20 LAB — URINE MICROSCOPIC-ADD ON

## 2013-05-20 LAB — URINALYSIS, ROUTINE W REFLEX MICROSCOPIC
Glucose, UA: NEGATIVE mg/dL
KETONES UR: 40 mg/dL — AB
Nitrite: NEGATIVE
PROTEIN: 30 mg/dL — AB
Specific Gravity, Urine: 1.028 (ref 1.005–1.030)
Urobilinogen, UA: 1 mg/dL (ref 0.0–1.0)
pH: 6 (ref 5.0–8.0)

## 2013-05-20 MED ORDER — ONDANSETRON HCL 4 MG PO TABS: 4.0000 mg | ORAL_TABLET | Freq: Four times a day (QID) | ORAL | Status: DC

## 2013-05-20 MED ORDER — OXYCODONE-ACETAMINOPHEN 5-325 MG PO TABS: 1.0000 | ORAL_TABLET | Freq: Four times a day (QID) | ORAL | Status: DC | PRN

## 2013-05-20 MED ORDER — KETOROLAC TROMETHAMINE 30 MG/ML IJ SOLN
30.0000 mg | Freq: Once | INTRAMUSCULAR | Status: AC
  Administered 2013-05-20: 30 mg via INTRAVENOUS
  Filled 2013-05-20: qty 1

## 2013-05-20 MED ORDER — SODIUM CHLORIDE 0.9 % IV SOLN
INTRAVENOUS | Status: DC
  Administered 2013-05-20: 15:00:00 via INTRAVENOUS

## 2013-05-20 MED ORDER — HYDROMORPHONE HCL PF 1 MG/ML IJ SOLN
1.0000 mg | Freq: Once | INTRAMUSCULAR | Status: AC
  Administered 2013-05-20: 1 mg via INTRAVENOUS
  Filled 2013-05-20: qty 1

## 2013-05-20 MED ORDER — ONDANSETRON HCL 4 MG/2ML IJ SOLN
4.0000 mg | Freq: Once | INTRAMUSCULAR | Status: AC
  Administered 2013-05-20: 4 mg via INTRAVENOUS
  Filled 2013-05-20: qty 2

## 2013-05-20 NOTE — ED Notes (Signed)
Pt comes to triage writhing  In w/c states rt  testicle hurts back hurts

## 2013-05-20 NOTE — ED Notes (Signed)
Father in law updated on plan of care via phone with pt permission.

## 2013-05-20 NOTE — Discharge Instructions (Signed)
Kidney Stones Kidney stones (urolithiasis) are deposits that form inside your kidneys. The intense pain is caused by the stone moving through the urinary tract. When the stone moves, the ureter goes into spasm around the stone. The stone is usually passed in the urine.  CAUSES   A disorder that makes certain neck glands produce too much parathyroid hormone (primary hyperparathyroidism).  A buildup of uric acid crystals, similar to gout in your joints.  Narrowing (stricture) of the ureter.  A kidney obstruction present at birth (congenital obstruction).  Previous surgery on the kidney or ureters.  Numerous kidney infections. SYMPTOMS   Feeling sick to your stomach (nauseous).  Throwing up (vomiting).  Blood in the urine (hematuria).  Pain that usually spreads (radiates) to the groin.  Frequency or urgency of urination. DIAGNOSIS   Taking a history and physical exam.  Blood or urine tests.  CT scan.  Occasionally, an examination of the inside of the urinary bladder (cystoscopy) is performed. TREATMENT   Observation.  Increasing your fluid intake.  Extracorporeal shock wave lithotripsy This is a noninvasive procedure that uses shock waves to break up kidney stones.  Surgery may be needed if you have severe pain or persistent obstruction. There are various surgical procedures. Most of the procedures are performed with the use of small instruments. Only small incisions are needed to accommodate these instruments, so recovery time is minimized. The size, location, and chemical composition are all important variables that will determine the proper choice of action for you. Talk to your health care provider to better understand your situation so that you will minimize the risk of injury to yourself and your kidney.  HOME CARE INSTRUCTIONS   Drink enough water and fluids to keep your urine clear or pale yellow. This will help you to pass the stone or stone fragments.  Strain  all urine through the provided strainer. Keep all particulate matter and stones for your health care provider to see. The stone causing the pain may be as small as a grain of salt. It is very important to use the strainer each and every time you pass your urine. The collection of your stone will allow your health care provider to analyze it and verify that a stone has actually passed. The stone analysis will often identify what you can do to reduce the incidence of recurrences.  Only take over-the-counter or prescription medicines for pain, discomfort, or fever as directed by your health care provider.  Make a follow-up appointment with your health care provider as directed.  Get follow-up X-rays if required. The absence of pain does not always mean that the stone has passed. It may have only stopped moving. If the urine remains completely obstructed, it can cause loss of kidney function or even complete destruction of the kidney. It is your responsibility to make sure X-rays and follow-ups are completed. Ultrasounds of the kidney can show blockages and the status of the kidney. Ultrasounds are not associated with any radiation and can be performed easily in a matter of minutes. SEEK MEDICAL CARE IF:  You experience pain that is progressive and unresponsive to any pain medicine you have been prescribed. SEEK IMMEDIATE MEDICAL CARE IF:   Pain cannot be controlled with the prescribed medicine.  You have a fever or shaking chills.  The severity or intensity of pain increases over 18 hours and is not relieved by pain medicine.  You develop a new onset of abdominal pain.  You feel faint or pass  out.  You are unable to urinate. MAKE SURE YOU:   Understand these instructions.  Will watch your condition.  Will get help right away if you are not doing well or get worse. Document Released: 04/02/2005 Document Revised: 12/03/2012 Document Reviewed: 09/03/2012 [facility] Patient Information 2014  [place].  Diet for Kidney Stones Kidney stones are small, hard masses that form inside your kidneys. They are made up of salts and minerals and often form when high levels build up in the urine. The minerals can then start to build up, crystalize, and stick together to form stones. There are several different types of kidney stones. The following types of stones may be influenced by dietary factors:   Calcium Oxalate Stones. An oxalate is a salt found in certain foods. Within the body, calcium can combine with oxalates to form calcium oxalate stones, which can be excreted in the urine in high amounts. This is the most common type of kidney stone.  Calcium Phosphate Stones. These stones may occur when the pH of the urine becomes too high, or less acidic, from too much calcium being excreted in the urine. The pH is a measure of how acidic or basic a substance is.  Uric Acid Stones. This type of stone occurs when the pH of the urine becomes too low, or very acidic, because substances called purines build up in the urine. Purines are found in animal proteins. When the urine is highly concentrated with acid, uric acid kidney stones can form.  Other risk factors for kidney stones include genetics, environment, and being overweight. Your caregiver may ask you to follow specific diet guidelines based on the type of stone you have to lessen the chances of your body making more kidney stones.  GENERAL GUIDELINES FOR ALL TYPES OF STONES  Drink plenty of fluid. Drink 12 16 cups of fluid a day, drinking mainly water.This is the most important thing you can do to prevent the formation of future kidney stones.  Maintain a healthy weight. Your caregiver or dietitian can help you determine what a healthy weight is for you. If you are overweight, weight loss may help prevent the formation of future kidney stones.  Eat a diet adequate in animal protein. Too much animal protein can contribute to the formation  of stones. Your dietitian can help you determine how much protein you should be eating. Avoid low carbohydrate, high protein diets.  Follow a balanced eating approach. The DASH diet, which stands for "Dietary Approaches to Stop Hypertension," is an effective meal plan for reducing stone formation. This diet is high in fruits, vegetables, dairy, and whole grains and low in animal protein. Ask your caregiver or dietitian for information about the DASH diet. ADDITIONAL DIET GUIDELINES FOR CALCIUM STONES Avoid foods high in salt. This includes table salt, salt seasonings, MSG, soy sauce, cured and processed meats, salted crackers and snack foods, fast food, and canned soups and foods. Ask your caregiver or dietitian for information about reducing sodium in your diet or following the low sodium diet.  Ensure adequate calcium intake. Use the following table for calcium guidelines:  Men 81 years old and younger  1000 mg/day.  Men 46 years old and older  1500 mg/day.  Women 51 31 years old  1000 mg/day.  Women 50 years and older  1500 mg/day. Your dietitian can help you determine if you are getting enough calcium in your diet. Foods that are high in calcium include dairy products, broccoli, cheese, yogurt, and  pudding. If you need to take a calcium supplement, take it only in the form of calcium citrate.  °Avoid foods high in oxalate. Be sure that any supplements you take do not contain more than 500 mg of vitamin C. Vitamin C is converted into oxalate in the body. You do not need to avoid fruits and vegetables high in vitamin C.  °· Grains: High-fiber or bran cereal, whole-wheat bread, grits, barley, buckwheat, amaranth, pretzels, and fruitcake. °· Vegetables: Dried beans, wax beans, dark leafy greens, eggplant, leeks, okra, parsley, rutabaga, tomato paste, watercress, zucchini, and escarole. °· Fruit: Dried apricots, red currants, figs, kiwi, and rhubarb. °· Meat and Meat Substitutes: Soybeans and foods made  from soy (soyburger, miso), dried beans, peanut butter. °· Milk: Chocolate milk mixes and soymilk. °· Fats and Oils: Nuts (peanuts, almonds, pecans, cashews, hazelnuts) and nut butters, sesame seeds, and tDahini paste. °· Condiments/Miscellaneous: Chocolate, carob, marmalade, poppy seeds, instant iced tea, and juice from high-oxalate fruits.    °Document Released: 07/28/2010 Document Revised: 10/02/2011 Document Reviewed: 09/17/2011 °[employer]® Patient Information ©2014 [employer]. ° °

## 2013-05-20 NOTE — ED Notes (Signed)
[name] (Father in Sports coach) (Cell) [phone]- Updates.

## 2013-05-20 NOTE — ED Provider Notes (Signed)
CSN: [id number]     Arrival date & time 05/20/13  1403 History   First MD Initiated Contact with Patient 05/20/13 1418     Chief Complaint  Patient presents with  . Testicle Pain   (Consider location/radiation/quality/duration/timing/severity/associated sxs/prior Treatment) HPI  Patient is a 33 yo male who presents today with sudden onset right sided flank and abdominal pain. The pain began about one hour ago and has been constant since. States the pain is the "worst I've ever had". He complains that his right testicle is painful also, but that the worst pain is in his right back and abdomen. The pain radiates from his back down through his right groin. States he had difficulty urinating today and was only able to get a few "trickles" of urine out. Is unable to find a comfortable position. Has had a kidney stone in the past, but says today's pain is much worse than that.  Has felt nauseous since onset of pain. Vomited one time upon arrival to ED because of pain. Denies recent fever, chills, headaches.    Past Medical History  Diagnosis Date  . Opiate dependence   . Hepatitis C   . Anxiety   . Depression   . Kidney stone    Past Surgical History  Procedure Laterality Date  . Appendectomy     Family History  Problem Relation Age of Onset  . Kidney cancer Father     renal cell carcenoma  . Hodgkin's lymphoma Mother   . Colon cancer Paternal Grandfather     great  . Lung cancer Paternal Grandmother    History  Substance Use Topics  . Smoking status: Former Smoker    Types: Cigarettes    Quit date: 04/16/2008  . Smokeless tobacco: Former Systems developer    Types: Chew    Quit date: 04/16/2008  . Alcohol Use: Yes     Comment: 1 per day    Review of Systems  Constitutional: Negative for fever and chills.  Respiratory: Negative for shortness of breath.   Cardiovascular: Negative for chest pain.  Gastrointestinal: Positive for nausea and vomiting. Negative for blood in stool.       Right  lower quadrant pain and right groin pain. Denies left sided abdominal or groin pain.   Genitourinary: Positive for flank pain, difficulty urinating and testicular pain. Negative for dysuria, frequency and hematuria.       Severe right flank pain. Denies left flank pain. Testicles painful, right more than left.   Neurological: Negative for light-headedness and headaches.    Allergies  Ceclor  Home Medications   Current Outpatient Rx  Name  Route  Sig  Dispense  Refill  . amphetamine-dextroamphetamine (ADDERALL) 20 MG tablet   Oral   Take 20 mg by mouth daily as needed (for ADHD).         Marland Kitchen buprenorphine-naloxone (SUBOXONE) 8-2 MG SUBL   Sublingual   Place 1 tablet under the tongue daily.         . ondansetron (ZOFRAN) 4 MG tablet   Oral   Take 1 tablet (4 mg total) by mouth every 6 (six) hours.   12 tablet   0   . oxyCODONE-acetaminophen (PERCOCET/ROXICET) 5-325 MG per tablet   Oral   Take 1 tablet by mouth every 6 (six) hours as needed for severe pain.   15 tablet   0    BP 115/77  Pulse 65  Resp 16  SpO2 99% Physical Exam  Constitutional: He is  oriented to person, place, and time. He appears well-developed and well-nourished.  Cardiovascular: Normal rate, regular rhythm and normal heart sounds.   Pulmonary/Chest: Effort normal and breath sounds normal.  Abdominal: Soft. There is tenderness.  Right lower quadrant and right groin tender to palpation. Right lower back most painful to palpation.   Genitourinary: Penis normal.  Testicles without discoloration. No masses. Mildly tender to palpation, right more than left.   Neurological: He is alert and oriented to person, place, and time.    ED Course  Procedures (including critical care time) Labs Review Labs Reviewed  URINALYSIS, ROUTINE W REFLEX MICROSCOPIC - Abnormal; Notable for the following:    Color, Urine AMBER (*)    APPearance CLOUDY (*)    Hgb urine dipstick LARGE (*)    Bilirubin Urine SMALL (*)     Ketones, ur 40 (*)    Protein, ur 30 (*)    Leukocytes, UA TRACE (*)    All other components within normal limits  CBC WITH DIFFERENTIAL - Abnormal; Notable for the following:    MCHC 37.0 (*)    All other components within normal limits  BASIC METABOLIC PANEL - Abnormal; Notable for the following:    Potassium 3.2 (*)    Glucose, Bld 129 (*)    All other components within normal limits  URINE MICROSCOPIC-ADD ON   Imaging Review Ct Abdomen Pelvis Wo Contrast  05/20/2013   CLINICAL DATA:  Right testicle pain.  History of kidney stones.  EXAM: CT ABDOMEN AND PELVIS WITHOUT CONTRAST  TECHNIQUE: Multidetector CT imaging of the abdomen and pelvis was performed following the standard protocol without intravenous contrast.  COMPARISON:  None.  FINDINGS: Included portions of the lung bases are unremarkable. No focal abnormality is seen in the liver or spleen on this study performed without intravenous contrast material. The stomach, duodenum, pancreas, gallbladder, and adrenal glands are unremarkable. The left kidney has normal imaging features.  There is mild right hydroureteronephrosis. The right ureter remains dilated down to the right UVJ where a 3 mm stone is identified. No stones within the bladder lumen. No left ureteral stones.  No abdominal aortic aneurysm. There is no free fluid or lymphadenopathy in the abdomen.  Imaging through the pelvis shows no free intraperitoneal fluid. There is no pelvic sidewall lymphadenopathy. No substantial diverticular disease in the colon. No colonic diverticulitis. The terminal ileum is normal. The appendix is not visualized, but there is no edema or inflammation in the region of the cecum.  Bone windows reveal no worrisome lytic or sclerotic osseous lesions.  IMPRESSION: 3 mm right ureterovesical junction stone with mild secondary changes in the right kidney and ureter.   Electronically Signed   By: Misty Stanley M.D.   On: 05/20/2013 15:53   US  Scrotum  05/20/2013   CLINICAL DATA:  Testicular pain  EXAM: SCROTAL ULTRASOUND  DOPPLER ULTRASOUND OF THE TESTICLES  TECHNIQUE: Complete ultrasound examination of the testicles, epididymis, and other scrotal structures was performed. Color and spectral Doppler ultrasound were also utilized to evaluate blood flow to the testicles.  COMPARISON:  None.  FINDINGS: Right testicle  Measurements: 4.4 x 2.1 x 2.7 cm. No mass or microlithiasis visualized.  Left testicle  Measurements: 4.6 x 1.8 x 2.3 cm. No mass or microlithiasis visualized.  Right epididymis:  Normal in size and appearance.  Left epididymis:  Normal in size and appearance.  Hydrocele:  None visualized.  Varicocele:  None visualized.  Pulsed Doppler interrogation of both testes  demonstrates low resistance arterial and venous waveforms bilaterally.  IMPRESSION: Normal study.  No evidence of torsion.  No focal abnormality.   Electronically Signed   By: Rolm Baptise M.D.   On: 05/20/2013 16:46   [place] Art/ven Flow Abd Pelv Doppler  05/20/2013   CLINICAL DATA:  Testicular pain  EXAM: SCROTAL ULTRASOUND  DOPPLER ULTRASOUND OF THE TESTICLES  TECHNIQUE: Complete ultrasound examination of the testicles, epididymis, and other scrotal structures was performed. Color and spectral Doppler ultrasound were also utilized to evaluate blood flow to the testicles.  COMPARISON:  None.  FINDINGS: Right testicle  Measurements: 4.4 x 2.1 x 2.7 cm. No mass or microlithiasis visualized.  Left testicle  Measurements: 4.6 x 1.8 x 2.3 cm. No mass or microlithiasis visualized.  Right epididymis:  Normal in size and appearance.  Left epididymis:  Normal in size and appearance.  Hydrocele:  None visualized.  Varicocele:  None visualized.  Pulsed Doppler interrogation of both testes demonstrates low resistance arterial and venous waveforms bilaterally.  IMPRESSION: Normal study.  No evidence of torsion.  No focal abnormality.   Electronically Signed   By: Rolm Baptise M.D.   On:  05/20/2013 16:46    EKG Interpretation   None       MDM   1. Kidney stone    CT scan shows 3 mm stone without hydronephrosis. NO UTI, no kidney dysfunction - strain all urine  Will give referral to Urology, treat with pain medications  32 y.o.Sharee Holster evaluation in the Emergency Department is complete. It has been determined that no acute conditions requiring further emergency intervention are present at this time. The patient/guardian have been advised of the diagnosis and plan. We have discussed signs and symptoms that warrant return to the ED, such as changes or worsening in symptoms.  Vital signs are stable at discharge. Filed Vitals:   05/20/13 1645  BP: 115/77  Pulse: 65  Resp:     Patient/guardian has voiced understanding and agreed to follow-up with the PCP or specialist.     Linus Mako, PA-C 05/20/13 1826

## 2013-05-22 NOTE — ED Provider Notes (Signed)
Medical screening examination/treatment/procedure(s) were performed by non-physician practitioner and as supervising physician I was immediately available for consultation/collaboration.  EKG Interpretation   None         Alfonzo Feller, DO 05/22/13 1400

## 2013-10-27 ENCOUNTER — Ambulatory Visit (INDEPENDENT_AMBULATORY_CARE_PROVIDER_SITE_OTHER): Payer: BC Managed Care – PPO | Admitting: Pulmonary Disease

## 2013-10-27 ENCOUNTER — Encounter: Payer: Self-pay | Admitting: Pulmonary Disease

## 2013-10-27 ENCOUNTER — Encounter (INDEPENDENT_AMBULATORY_CARE_PROVIDER_SITE_OTHER): Payer: Self-pay

## 2013-10-27 VITALS — BP 118/72 | HR 81 | Temp 98.0°F | Ht 68.0 in | Wt 169.2 lb

## 2013-10-27 DIAGNOSIS — G47419 Narcolepsy without cataplexy: Secondary | ICD-10-CM | POA: Insufficient documentation

## 2013-10-27 DIAGNOSIS — G4710 Hypersomnia, unspecified: Secondary | ICD-10-CM

## 2013-10-27 NOTE — Patient Instructions (Signed)
Please fill out sleep diaries for the 2 weeks leading up to your sleep study.  Try and get as much sleep as possible leading up to this. Will schedule for a nighttime study followed by daytime study if the study at night is normal  Will arrange followup with me once the results are available.

## 2013-10-27 NOTE — Assessment & Plan Note (Addendum)
The patient has significant daytime hypersomnia that has been going on for some time, and also reports restless and nonrestorative sleep.  It is unclear if he has a nocturnal sleep disorder such as RLS or sleep apnea, or whether he may have narcolepsy or idiopathic hypersomnia to explain his daytime sleepiness. At this point, I would like to do 2 weeks of a sleep diary, followed by a NPSG and MSLT.  This is the only way to work up the patient's sleep complaints an abnormal sleepiness. He is agreeable to this approach.

## 2013-10-27 NOTE — Progress Notes (Signed)
Subjective:    Patient ID: [name], male    DOB: Jan 18, 1981, 34 y.o.   MRN: [id number]  HPI The patient is a 33 year old male who comes in today as a self-referral for hypersomnia. The patient states this has been going on for a few years but feels that it is getting worse. He gets about 6 hours a night on a consistent basis, but awakens at least 6-7 times a night for unknown reasons. When he awakens in the morning, he does not feel rested. His bed partner has not heard snoring, nor has she seen an abnormal breathing pattern during sleep. He does have some kicking during the night, and describes intermittent symptoms consistent with RLS. The patient stays very busy at work, but gets significant sleepiness in the afternoon. He will fall asleep very quickly watching television in the evenings, and has sleepiness at times with driving. The patient is describing sleep paralysis 2-3 times a week, and it is frightening to him. He denies any cataplexy, but does have the suggestion of hypnagogic hallucinations. The patient's Epworth score today is 18.   Sleep Questionnaire What time do you typically go to bed?( Between what hours) 10pm-12am 10pm-12am at 1337 on 10/27/13 by Lilli Few, CMA How long does it take you to fall asleep? 5 minutes 5 minutes at 1337 on 10/27/13 by Lilli Few, CMA How many times during the night do you wake up? 7 7 at 1337 on 10/27/13 by Lilli Few, CMA What time do you get out of bed to start your day? 0630 0630 at 1337 on 10/27/13 by Lilli Few, CMA Do you drive or operate heavy machinery in your occupation? Yes Yes at 1337 on 10/27/13 by Lilli Few, CMA How much has your weight changed (up or down) over the past two years? (In pounds) 5 lb (2.268 kg) 5 lb (2.268 kg) at 1337 on 10/27/13 by Lilli Few, CMA Have you ever had a sleep study before? No No at 1337 on 10/27/13 by Lilli Few, CMA Do you  currently use CPAP? No No at 1337 on 10/27/13 by Lilli Few, CMA Do you wear oxygen at any time? No    Review of Systems  Constitutional: Negative for fever and unexpected weight change.  HENT: Negative for congestion, dental problem, ear pain, nosebleeds, postnasal drip, rhinorrhea, sinus pressure, sneezing, sore throat and trouble swallowing.   Eyes: Negative for redness and itching.  Respiratory: Negative for cough, chest tightness, shortness of breath and wheezing.   Cardiovascular: Negative for palpitations and leg swelling.  Gastrointestinal: Negative for nausea and vomiting.  Genitourinary: Negative for dysuria.  Musculoskeletal: Negative for joint swelling.  Skin: Negative for rash.  Neurological: Negative for headaches.  Hematological: Does not bruise/bleed easily.  Psychiatric/Behavioral: Negative for dysphoric mood. The patient is nervous/anxious.        Objective:   Physical Exam Constitutional:  Well developed, no acute distress  HENT:  Nares patent without discharge  Oropharynx without exudate, palate and uvula are normal  Eyes:  Perrla, eomi, no scleral icterus  Neck:  No JVD, no TMG  Cardiovascular:  Normal rate, regular rhythm, no rubs or gallops.  No murmurs        Intact distal pulses  Pulmonary :  Normal breath sounds, no stridor or respiratory distress   No rales, rhonchi, or wheezing  Abdominal:  Soft, nondistended, bowel sounds present.  No tenderness noted.   Musculoskeletal:  No lower extremity  edema noted.  Lymph Nodes:  No cervical lymphadenopathy noted  Skin:  No cyanosis noted  Neurologic:  Alert, appropriate, moves all 4 extremities without obvious deficit.         Assessment & Plan:

## 2013-10-28 ENCOUNTER — Emergency Department (HOSPITAL_COMMUNITY)
Admission: EM | Admit: 2013-10-28 | Discharge: 2013-10-28 | Disposition: A | Discharge status: Home / Self Care | Payer: BC Managed Care – PPO | Attending: Emergency Medicine | Admitting: Emergency Medicine

## 2013-10-28 ENCOUNTER — Encounter (HOSPITAL_COMMUNITY): Payer: Self-pay | Admitting: Emergency Medicine

## 2013-10-28 ENCOUNTER — Emergency Department (HOSPITAL_COMMUNITY): Payer: BC Managed Care – PPO

## 2013-10-28 DIAGNOSIS — Z79899 Other long term (current) drug therapy: Secondary | ICD-10-CM | POA: Insufficient documentation

## 2013-10-28 DIAGNOSIS — R1110 Vomiting, unspecified: Secondary | ICD-10-CM | POA: Insufficient documentation

## 2013-10-28 DIAGNOSIS — Z8619 Personal history of other infectious and parasitic diseases: Secondary | ICD-10-CM | POA: Insufficient documentation

## 2013-10-28 DIAGNOSIS — Z87891 Personal history of nicotine dependence: Secondary | ICD-10-CM | POA: Insufficient documentation

## 2013-10-28 DIAGNOSIS — Z87442 Personal history of urinary calculi: Secondary | ICD-10-CM | POA: Insufficient documentation

## 2013-10-28 DIAGNOSIS — R350 Frequency of micturition: Secondary | ICD-10-CM | POA: Insufficient documentation

## 2013-10-28 DIAGNOSIS — Z8659 Personal history of other mental and behavioral disorders: Secondary | ICD-10-CM | POA: Insufficient documentation

## 2013-10-28 DIAGNOSIS — R109 Unspecified abdominal pain: Secondary | ICD-10-CM | POA: Insufficient documentation

## 2013-10-28 LAB — CBC
HCT: 38.3 % — ABNORMAL LOW (ref 39.0–52.0)
HEMOGLOBIN: 14 g/dL (ref 13.0–17.0)
MCH: 30.6 pg (ref 26.0–34.0)
MCHC: 36.6 g/dL — AB (ref 30.0–36.0)
MCV: 83.8 fL (ref 78.0–100.0)
PLATELETS: 289 10*3/uL (ref 150–400)
RBC: 4.57 MIL/uL (ref 4.22–5.81)
RDW: 11.9 % (ref 11.5–15.5)
WBC: 7.5 10*3/uL (ref 4.0–10.5)

## 2013-10-28 LAB — URINALYSIS, ROUTINE W REFLEX MICROSCOPIC
Bilirubin Urine: NEGATIVE
GLUCOSE, UA: NEGATIVE mg/dL
Hgb urine dipstick: NEGATIVE
Ketones, ur: NEGATIVE mg/dL
Leukocytes, UA: NEGATIVE
Nitrite: NEGATIVE
PH: 6.5 (ref 5.0–8.0)
PROTEIN: NEGATIVE mg/dL
Specific Gravity, Urine: 1.005 (ref 1.005–1.030)
Urobilinogen, UA: 0.2 mg/dL (ref 0.0–1.0)

## 2013-10-28 LAB — I-STAT CHEM 8, ED
BUN: 11 mg/dL (ref 6–23)
CALCIUM ION: 1.13 mmol/L (ref 1.12–1.23)
Chloride: 102 mEq/L (ref 96–112)
Creatinine, Ser: 0.8 mg/dL (ref 0.50–1.35)
GLUCOSE: 96 mg/dL (ref 70–99)
HEMATOCRIT: 41 % (ref 39.0–52.0)
HEMOGLOBIN: 13.9 g/dL (ref 13.0–17.0)
POTASSIUM: 3.9 meq/L (ref 3.7–5.3)
Sodium: 137 mEq/L (ref 137–147)
TCO2: 23 mmol/L (ref 0–100)

## 2013-10-28 MED ORDER — LIDOCAINE 5 % EX PTCH: 1.0000 | MEDICATED_PATCH | CUTANEOUS | Status: DC

## 2013-10-28 MED ORDER — ONDANSETRON HCL 4 MG/2ML IJ SOLN
4.0000 mg | Freq: Once | INTRAMUSCULAR | Status: AC
  Administered 2013-10-28: 4 mg via INTRAVENOUS
  Filled 2013-10-28: qty 2

## 2013-10-28 MED ORDER — METHOCARBAMOL 500 MG PO TABS: 500.0000 mg | ORAL_TABLET | Freq: Two times a day (BID) | ORAL | Status: DC

## 2013-10-28 MED ORDER — MORPHINE SULFATE 4 MG/ML IJ SOLN
4.0000 mg | Freq: Once | INTRAMUSCULAR | Status: AC
Start: 2013-10-28 — End: 2013-10-28
  Administered 2013-10-28: 4 mg via INTRAVENOUS
  Filled 2013-10-28: qty 1

## 2013-10-28 NOTE — ED Provider Notes (Signed)
CSN: [id number]     Arrival date & time 10/28/13  0229 History   First MD Initiated Contact with Patient 10/28/13 0253     Chief Complaint  Patient presents with  . Flank Pain     (Consider location/radiation/quality/duration/timing/severity/associated sxs/prior Treatment) HPI Comments: Patient is a 33 year old male past medical history significant for opiate dependence, hepatitis C, anxiety, depression, history of kidney stone presented to the emergency department for acute onset waxing and waning left sided flank pain w/o radiation 3 PM yesterday. He endorses one episode of non-bloody non-bilious emesis earlier this morning. Alleviating factors: none. Aggravating factors: none. Medications tried prior to arrival: none. Endorses associated urinary frequency. Denies any fevers, chills, abdominal pain, diarrhea, constipation chest pain, short of breath. Abdominal surgical history includes appendectomy.    Patient is a 33 y.o. male presenting with flank pain.  Flank Pain Associated symptoms include vomiting. Pertinent negatives include no abdominal pain, chills, fever or nausea.    Past Medical History  Diagnosis Date  . Opiate dependence   . Hepatitis C   . Anxiety   . Depression   . Kidney stone    Past Surgical History  Procedure Laterality Date  . Appendectomy     Family History  Problem Relation Age of Onset  . Kidney cancer Father     renal cell carcenoma  . Hodgkin's lymphoma Mother   . Colon cancer Paternal Grandfather     great  . Lung cancer Paternal Grandmother    History  Substance Use Topics  . Smoking status: Former Smoker -- 0.50 packs/day for 9 years    Types: Cigarettes    Quit date: 04/16/2008  . Smokeless tobacco: Former Systems developer    Types: Chew    Quit date: 04/16/2008  . Alcohol Use: Yes     Comment: 1 per day    Review of Systems  Constitutional: Negative for fever and chills.  Gastrointestinal: Positive for vomiting. Negative for nausea and  abdominal pain.  Genitourinary: Positive for frequency and flank pain.  All other systems reviewed and are negative.     Allergies  Ceclor  Home Medications   Prior to Admission medications   Medication Sig Start Date End Date Taking? Authorizing Provider  buprenorphine-naloxone (SUBOXONE) 8-2 MG SUBL Place 1 tablet under the tongue 2 (two) times daily.    Yes Historical Provider, MD  Multiple Vitamin (MULTIVITAMIN WITH MINERALS) TABS tablet Take 1 tablet by mouth daily.   Yes Historical Provider, MD  lidocaine (LIDODERM) 5 % Place 1 patch onto the skin daily. Remove & Discard patch within 12 hours or as directed by MD 10/28/13   Stephani Police Caylor Tallarico, PA-C  methocarbamol (ROBAXIN) 500 MG tablet Take 1 tablet (500 mg total) by mouth 2 (two) times daily. 10/28/13   Ellieana Dolecki L Hayes Rehfeldt, PA-C   BP 106/70  Pulse 61  Temp(Src) 98 F (36.7 C) (Oral)  Resp 18  Ht 5\' 7"  (1.702 m)  Wt 169 lb (76.658 kg)  BMI 26.46 kg/m2  SpO2 100% Physical Exam  Nursing note and vitals reviewed. Constitutional: He is oriented to person, place, and time. He appears well-developed and well-nourished. No distress.  Patient walking around the room, uncomfortable appearing.  HENT:  Head: Normocephalic and atraumatic.  Right Ear: External ear normal.  Left Ear: External ear normal.  Nose: Nose normal.  Mouth/Throat: Oropharynx is clear and moist. No oropharyngeal exudate.  Eyes: Conjunctivae are normal.  Neck: Normal range of motion. Neck supple.  Cardiovascular: Normal rate,  regular rhythm and normal heart sounds.   Pulmonary/Chest: Effort normal and breath sounds normal. No respiratory distress.  Abdominal: Soft. Bowel sounds are normal. There is no tenderness. There is no rigidity, no rebound, no guarding and no CVA tenderness.  Musculoskeletal:       Back:  MAE x 4  Neurological: He is alert and oriented to person, place, and time.  Skin: Skin is warm and dry. He is not diaphoretic.    Psychiatric: He has a normal mood and affect.    ED Course  Procedures (including critical care time) Medications  morphine 4 MG/ML injection 4 mg (4 mg Intravenous Given 10/28/13 0308)  ondansetron (ZOFRAN) injection 4 mg (4 mg Intravenous Given 10/28/13 0308)    Labs Review Labs Reviewed  CBC - Abnormal; Notable for the following:    HCT 38.3 (*)    MCHC 36.6 (*)    All other components within normal limits  URINE CULTURE  URINALYSIS, ROUTINE W REFLEX MICROSCOPIC  I-STAT CHEM 8, ED    Imaging Review Ct Abdomen Pelvis Wo Contrast  10/28/2013   CLINICAL DATA:  Left flank pain  EXAM: CT ABDOMEN AND PELVIS WITHOUT CONTRAST  TECHNIQUE: Multidetector CT imaging of the abdomen and pelvis was performed following the standard protocol without IV contrast.  COMPARISON:  05/20/2013  FINDINGS: BODY WALL: Unremarkable.  LOWER CHEST: Unremarkable.  ABDOMEN/PELVIS:  Liver: Incomplete imaging.  Visualized portions without abnormality.  Biliary: No evidence of biliary obstruction or stone.  Pancreas: Unremarkable.  Spleen: Imaged portions unremarkable.  Adrenals: Unremarkable.  Kidneys and ureters: No hydronephrosis or stone.  Bladder: Unremarkable.  Reproductive: Unremarkable.  Bowel: No obstruction. Appendectomy.  Retroperitoneum: No mass or adenopathy.  Peritoneum: No ascites or pneumoperitoneum.  Vascular: No acute abnormality.  OSSEOUS: No acute abnormalities.  IMPRESSION: Negative CT of the abdomen and pelvis.   Electronically Signed   By: Jorje Guild M.D.   On: 10/28/2013 04:10     EKG Interpretation None      MDM   Final diagnoses:  Left flank pain    Filed Vitals:   10/28/13 0432  BP: 106/70  Pulse: 61  Temp: 98 F (36.7 C)  Resp: 18   Afebrile, NAD, non-toxic appearing, AAOx4. I have reviewed nursing notes, vital signs, and all appropriate lab and imaging results for this patient. Abdomen soft, nontender, nondistended. No peritoneal signs. No CVA tenderness. Left mid to  low back tender as noted above in physical exam. Creatinine is within normal limits. Urine without evidence of infection or hemoglobin to suggest kidney stone. CT abdomen pelvis without contrast is unremarkable. No acute findings. Pain symptoms have improved while in the emergency department. Will treat with Lidoderm patches on back and muscle relaxers. Precautions discussed. Patient is agreeable to plan. Patient is stable for discharge.        Harlow Mares, PA-C 10/28/13 [phone]

## 2013-10-28 NOTE — Discharge Instructions (Signed)
Please follow up with your primary care physician in 1-2 days. If you do not have one please call the [facility] number listed above. Please take pain medication and/or muscle relaxants as prescribed and as needed for pain. Please do not drive on narcotic pain medication or on muscle relaxants. Please read all discharge instructions and return precautions.    Flank Pain Flank pain refers to pain that is located on the side of the body between the upper abdomen and the back. The pain may occur over a short period of time (acute) or may be long-term or reoccurring (chronic). It may be mild or severe. Flank pain can be caused by many things. CAUSES  Some of the more common causes of flank pain include:  Muscle strains.   Muscle spasms.   A disease of your spine (vertebral disk disease).   A lung infection (pneumonia).   Fluid around your lungs (pulmonary edema).   A kidney infection.   Kidney stones.   A very painful skin rash caused by the chickenpox virus (shingles).   Gallbladder disease.  [place] care will depend on the cause of your pain. In general,  Rest as directed by your caregiver.  Drink enough fluids to keep your urine clear or pale yellow.  Only take over-the-counter or prescription medicines as directed by your caregiver. Some medicines may help relieve the pain.  Tell your caregiver about any changes in your pain.  Follow up with your caregiver as directed. SEEK IMMEDIATE MEDICAL CARE IF:   Your pain is not controlled with medicine.   You have new or worsening symptoms.  Your pain increases.   You have abdominal pain.   You have shortness of breath.   You have persistent nausea or vomiting.   You have swelling in your abdomen.   You feel faint or pass out.   You have blood in your urine.  You have a fever or persistent symptoms for more than 2-3 days.  You have a fever and your symptoms  suddenly get worse. MAKE SURE YOU:   Understand these instructions.  Will watch your condition.  Will get help right away if you are not doing well or get worse. Document Released: 05/24/2005 Document Revised: 12/26/2011 Document Reviewed: 11/15/2011 [facility] Patient Information 2015 [place], [place]. This information is not intended to replace advice given to you by your health care provider. Make sure you discuss any questions you have with your health care provider.

## 2013-10-28 NOTE — ED Notes (Signed)
Pt complains of left flank pain since earlier this am, hx of kidney stones

## 2013-10-28 NOTE — ED Notes (Signed)
Pt states that he began to have left flank pain around 3pm; pt states that it started as off and on but has progressed to constant and has become unbearable; pt states that he has had a hx of kidney stones and that this feels the same; pt c/o urinary frequency and urgency

## 2013-10-28 NOTE — ED Notes (Signed)
Pt returned from CT °

## 2013-10-28 NOTE — ED Provider Notes (Signed)
Medical screening examination/treatment/procedure(s) were performed by non-physician practitioner and as supervising physician I was immediately available for consultation/collaboration.   EKG Interpretation None       Kartel Wolbert K Nataleigh Griffin-Rasch, MD 10/28/13 [phone]

## 2013-10-29 LAB — URINE CULTURE
CULTURE: NO GROWTH
Colony Count: NO GROWTH

## 2014-01-10 ENCOUNTER — Ambulatory Visit (HOSPITAL_BASED_OUTPATIENT_CLINIC_OR_DEPARTMENT_OTHER): Payer: BC Managed Care – PPO | Attending: Pulmonary Disease | Admitting: Radiology

## 2014-01-10 VITALS — Ht 67.0 in | Wt 160.0 lb

## 2014-01-10 DIAGNOSIS — G4730 Sleep apnea, unspecified: Secondary | ICD-10-CM | POA: Diagnosis present

## 2014-01-10 DIAGNOSIS — G4710 Hypersomnia, unspecified: Secondary | ICD-10-CM | POA: Diagnosis not present

## 2014-01-10 DIAGNOSIS — G4733 Obstructive sleep apnea (adult) (pediatric): Secondary | ICD-10-CM

## 2014-01-11 ENCOUNTER — Telehealth: Payer: Self-pay | Admitting: Pulmonary Disease

## 2014-01-11 ENCOUNTER — Ambulatory Visit (HOSPITAL_BASED_OUTPATIENT_CLINIC_OR_DEPARTMENT_OTHER): Payer: BC Managed Care – PPO | Attending: Pulmonary Disease | Admitting: Radiology

## 2014-01-11 DIAGNOSIS — G4710 Hypersomnia, unspecified: Secondary | ICD-10-CM | POA: Diagnosis not present

## 2014-01-11 DIAGNOSIS — G4719 Other hypersomnia: Secondary | ICD-10-CM

## 2014-01-11 NOTE — Telephone Encounter (Signed)
Spoke with Lynnae Sandhoff, concerned with patients sleep study results.  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  Stated that the patient had a hard time staying awake during the naps today during the test-- a few times where he almost fell off the bed while sitting up. Lynnae Sandhoff states that he had a conversation with the patient and he informed Lynnae Sandhoff that he has a hard time some days staying awake and alert at work. 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 Lynnae Sandhoff is concerned with this information and the type of job that the patient obtains that this could become harmful to the patient.   Aware that [facility] not back in office until 01/12/14 -- states that it can wait until [facility] returns as there may not be much the on call sleep docs can do for the patient.   Will still send to Dr Annamaria Boots in Dr Janifer Adie absence to see if any rec's can be offered for the patient until [facility] returns to read study and give rec's for patient.

## 2014-01-11 NOTE — Telephone Encounter (Signed)
Please tell patient that his sleep study showed he was much sleepier than normal. Dr Gwenette Greet will address this when he gets back. In the meantime, naps and caffeine may helps some. The patient must be extra careful driving and operating machinery, so he doesn't injure himself or someone else- he is the responsible driver.

## 2014-01-11 NOTE — Telephone Encounter (Signed)
LMTCB for pt x 1  

## 2014-01-12 NOTE — Telephone Encounter (Signed)
Pt returned call. Informed pt of recs per CY. Pt verbalized understanding and denied any further questions or concerns at this time. Pt aware [place] will also review results and advise further.

## 2014-01-18 ENCOUNTER — Telehealth: Payer: Self-pay | Admitting: Pulmonary Disease

## 2014-01-18 DIAGNOSIS — G4733 Obstructive sleep apnea (adult) (pediatric): Secondary | ICD-10-CM

## 2014-01-18 NOTE — Telephone Encounter (Signed)
lmomtcb x1 

## 2014-01-18 NOTE — Sleep Study (Signed)
   NAME: [name] DATE OF BIRTH:  Apr 02, 1981 MEDICAL RECORD NUMBER [id number]  LOCATION: [facility]  PHYSICIAN: [place] OF STUDY: 01/10/2014  SLEEP STUDY TYPE: Nocturnal Polysomnogram               REFERRING PHYSICIAN: Clance, Armando Reichert, MD  INDICATION FOR STUDY: Hypersomnia with sleep apnea  EPWORTH SLEEPINESS SCORE:  19 HEIGHT: 5\' 7"  (170.2 cm)  WEIGHT: 160 lb (72.576 kg)    Body mass index is 25.05 kg/(m^2).  NECK SIZE: 16 in.  MEDICATIONS: Reviewed in the medical record  SLEEP ARCHITECTURE: The patient had a total sleep time of 418 minutes, with adequate slow-wave sleep for age and also adequate REM. Sleep onset latency was normal at 2.5 minutes, and REM onset was normal at 60 minutes. Sleep efficiency was 97%.  RESPIRATORY DATA: The patient was found to have no obstructive apneas and only one obstructive hypopnea, giving him an AHI of only 0.1 events per hour.  There was mild snoring noted throughout.  OXYGEN DATA: There was transient oxygen desaturation as low as 92% during the night  CARDIAC DATA: No clinically significant arrhythmias were seen  MOVEMENT/PARASOMNIA: No significant leg jerks or abnormal behaviors were noted.  IMPRESSION/ RECOMMENDATION:    1) small numbers of obstructive events which do not meet the AHI criteria for the obstructive sleep apnea syndrome. There was mild snoring noted throughout.  2) no abnormal findings to suggest the etiology for the patient's severe daytime sleepiness. Clinical correlation is suggested.     [place], American Board of Sleep Medicine  ELECTRONICALLY SIGNED ON:  01/18/2014, 3:58 PM [place] PH: [phone]   FX: [phone] [place]

## 2014-01-18 NOTE — Sleep Study (Signed)
    NAME: [name] DATE OF BIRTH:  [birth year] MEDICAL RECORD NUMBER [id number]  LOCATION: [facility]  PHYSICIAN: Kathee Delton  DATE OF STUDY: 01/11/2014  SLEEP STUDY TYPE: Multiple Sleep Latency Test               REFERRING PHYSICIAN: Clance, Armando Reichert, MD  INDICATION FOR STUDY: Hypersomnia of unknown origin  EPWORTH SLEEPINESS SCORE:  19 HEIGHT:    WEIGHT:      There is no weight on file to calculate BMI.  NECK SIZE:   in.  MEDICATIONS: Reviewed in the sleep record  NAP 1: Sleep onset latency of 0.5 minutes, with REM latency of 2 minutes.  NAP 2: Sleep onset latency of 0.5 minutes, with REM latency of 10.5 minutes.  NAP 3: Sleep onset latency of 0.5 minutes, with no REM noted  NAP 4: Sleep onset latency of 0.5 minutes, with REM onset latency of 6.5 minutes.  NAP 5: Sleep onset latency of 0 minutes, with REM onset latency of 1 minute.  MEAN SLEEP LATENCY: 0.4 minutes  NUMBER OF REM EPISODES:  4  COMMENTS:    1) the patient underwent a nocturnal polysomnogram prior to his MS LT that was normal.  2) the patient completed sleep diaries for the 2 weeks leading up to his MS LT which showed adequate total sleep time on a nightly basis.  IMPRESSION/ RECOMMENDATION:    1) severe objective daytime sleepiness, with a mean sleep latency of 0.4 minutes and 4 sleep onset REM periods noted.  If in the appropriate clinical situation, this is most consistent with a diagnosis of narcolepsy.    [place], American Board of Sleep Medicine  ELECTRONICALLY SIGNED ON:  01/18/2014, 4:05 PM [place] PH: [phone]   FX: [phone] [place]

## 2014-01-18 NOTE — Telephone Encounter (Signed)
Called pt. He is scheduled to come in and see [place] at 2

## 2014-01-18 NOTE — Telephone Encounter (Signed)
Needs ov this week if possible to review his sleep study

## 2014-01-19 ENCOUNTER — Encounter: Payer: Self-pay | Admitting: Pulmonary Disease

## 2014-01-19 ENCOUNTER — Ambulatory Visit (INDEPENDENT_AMBULATORY_CARE_PROVIDER_SITE_OTHER): Payer: BC Managed Care – PPO | Admitting: Pulmonary Disease

## 2014-01-19 ENCOUNTER — Encounter (INDEPENDENT_AMBULATORY_CARE_PROVIDER_SITE_OTHER): Payer: Self-pay

## 2014-01-19 VITALS — BP 124/78 | HR 75 | Temp 98.0°F | Ht 68.0 in | Wt 171.4 lb

## 2014-01-19 DIAGNOSIS — G47419 Narcolepsy without cataplexy: Secondary | ICD-10-CM

## 2014-01-19 DIAGNOSIS — G4710 Hypersomnia, unspecified: Secondary | ICD-10-CM | POA: Diagnosis not present

## 2014-01-19 MED ORDER — ARMODAFINIL 150 MG PO TABS: 150.0000 mg | ORAL_TABLET | Freq: Every day | ORAL | Status: DC

## 2014-01-19 NOTE — Patient Instructions (Signed)
Will start on nuvigil 150mg  one each am.  Let me know if you are having insurance/cost issues with this.  Modest use of caffeine during day to help with alertness, and can also try to take 15-21min naps 1-2 times a day as well. Make sure you get at least 7-8 hrs of sleep a night.  This is very important.  followup with me again in 8 weeks.

## 2014-01-19 NOTE — Progress Notes (Signed)
   Subjective:    Patient ID: [name], male    DOB: 1980-10-21, 33 y.o.   MRN: [id number]  HPI The patient comes in today for followup of his recent testing for hypersomnia of unknown origin.  His overnight sleep test was completely normal, with no evidence for sleep disordered breathing or movement disorder. He didn't had a multiple sleep latency test where he had a very short sleep onset latency, and achieved REM and 4/5 naps. I have reviewed the studies with he and his family member in great detail, and answered all of their questions.   Review of Systems  Constitutional: Negative for fever and unexpected weight change.  HENT: Negative for congestion, dental problem, ear pain, nosebleeds, postnasal drip, rhinorrhea, sinus pressure, sneezing, sore throat and trouble swallowing.   Eyes: Negative for redness and itching.  Respiratory: Negative for cough, chest tightness, shortness of breath and wheezing.   Cardiovascular: Negative for palpitations and leg swelling.  Gastrointestinal: Negative for nausea and vomiting.  Genitourinary: Negative for dysuria.  Musculoskeletal: Negative for joint swelling.  Skin: Negative for rash.  Neurological: Negative for headaches.  Hematological: Does not bruise/bleed easily.  Psychiatric/Behavioral: Negative for dysphoric mood. The patient is not nervous/anxious.        Objective:   Physical Exam Thin male in no acute distress Nose without purulence or discharge noted Lower extremities without edema, no cyanosis Appear sleepy, but appropriate, moves all 4 extremities.       Assessment & Plan:

## 2014-01-19 NOTE — Assessment & Plan Note (Addendum)
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 He does not have any other neurologic complaints. At this point, I will start him on a stimulant medication to see if we can improve his alertness during the day. I have also stressed to him the importance of getting adequate sleep at night, taking 15-30 minutes naps during the day if able, and also modest use of caffeine. I will see him back in 8 weeks to check on his progress.  Time spent with pt was 42min discussing the above.

## 2014-01-20 NOTE — Telephone Encounter (Signed)
Pt had appt with [place] on 10/6 to review sleep study results. Will sign off.

## 2014-02-09 ENCOUNTER — Telehealth: Payer: Self-pay | Admitting: Pulmonary Disease

## 2014-02-09 NOTE — Telephone Encounter (Signed)
Pt states that Nuvigil is no longer helping to keep him alert enough-helped the first 2-3 days and has made him more moody with headaches. Pt states he would like to know what other recs [place] has for him. Pt is aware he will get a call back tomorrow regarding this matter.   [place] Please advise. Thanks.

## 2014-02-09 NOTE — Telephone Encounter (Signed)
Make sure he understands there is NO medication that is going to keep him from being sleepy.  That is part of narcolepsy, which has no cure.  Naps are important, getting 8 hrs of sleep a night is key.  Moderate use of caffeine.  We can try him on different medications that may have possible cardiac side effects over the years.  He will need to be on medication for this the rest of his life.  If he still wishes to try something different, we can write script for him to pick up.

## 2014-02-09 NOTE — Telephone Encounter (Signed)
LMTCB

## 2014-02-10 MED ORDER — AMPHETAMINE-DEXTROAMPHETAMINE 10 MG PO TABS: 10.0000 mg | ORAL_TABLET | ORAL | Status: DC

## 2014-02-10 NOTE — Telephone Encounter (Signed)
Called and spoke to pt. Informed pt the new rx and it is up front for pick up. Pt verbalized understanding and denied any further questions or concerns at this time.

## 2014-02-10 NOTE — Telephone Encounter (Signed)
Pt aware of rec's per [facility] States that he understands that Dr Gwenette Greet know what's best and knows what medication will work best for him. Pt concerned with his increasing mood changes; reports that he is very irritable and moody, like he has a shorter fuse recently. Pt c/o in [place] daily. Pt states that he has been having episodes of sleep paralysis while in car sitting at stop light, states that this scares him. Pt very much aware of how dangerous this is while operating a vehicle, whether it be stopped at a stop sign or light or in full motion.   Requests further rec's for medications-- aware of cardiac risks with other medications as described below.  Please advise Dr Gwenette Greet. Thanks.

## 2014-02-10 NOTE — Telephone Encounter (Signed)
rx printed and placed in [facility] look-at to sign. [id number] to advise the pt. Logan Bing, CMA

## 2014-02-10 NOTE — Telephone Encounter (Signed)
Pt has returned call - [phone]

## 2014-02-10 NOTE — Telephone Encounter (Signed)
Pt has called back - [phone]

## 2014-02-10 NOTE — Telephone Encounter (Signed)
LMTCB

## 2014-02-10 NOTE — Telephone Encounter (Signed)
Ok to start adderall 10mg  one each am   #30, no fills Have him call us in 2 weeks to give update with how things are going.

## 2014-03-16 ENCOUNTER — Encounter: Payer: Self-pay | Admitting: Pulmonary Disease

## 2014-03-16 ENCOUNTER — Ambulatory Visit (INDEPENDENT_AMBULATORY_CARE_PROVIDER_SITE_OTHER): Payer: BC Managed Care – PPO | Admitting: Pulmonary Disease

## 2014-03-16 VITALS — BP 130/78 | HR 81 | Temp 97.5°F | Ht 67.0 in | Wt 167.0 lb

## 2014-03-16 DIAGNOSIS — G47419 Narcolepsy without cataplexy: Secondary | ICD-10-CM

## 2014-03-16 MED ORDER — AMPHETAMINE-DEXTROAMPHETAMINE 10 MG PO TABS: ORAL_TABLET | ORAL | Status: DC

## 2014-03-16 NOTE — Assessment & Plan Note (Signed)
The patient saw a definite improvement with Adderall over Nuvigil, but was having a lot of breakthrough sleepiness in the afternoon and therefore moved his dose later in the day. He was supposed to call me and give me feedback 2 weeks after making a change, but unfortunately I did not hear from him. Will now increase his dose to 10 mg twice a day, and he understands that we can increase further if needed. It sounds like he is really working hard on good sleep hygiene and trying to take short naps during the day. I have asked him to continue on this.

## 2014-03-16 NOTE — Patient Instructions (Signed)
Will increase your adderall dose to 10mg  in the morning and 10mg  in the afternoon.  If you feel you are still having a lot of sleepiness, let us know. Continue getting adequate sleep at night, and taking short naps if possible during the day.  followup with me again in 73mos

## 2014-03-16 NOTE — Progress Notes (Signed)
   Subjective:    Patient ID: [name], male    DOB: 12-Jun-1980, 33 y.o.   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 At the last visit, he was started on Nuvigil, but did not feel this adequately controlled his symptoms. He also had side effects, including headaches. We subsequently changed him to Adderall 10 mg each morning, and asked him to call us with his response. He has seen a definite improvement over Nuvigil, but was having significant sleepiness in the afternoon. He has circumvented despite taking his Adderall later in the morning or even around noon. I have told him that we can split his dose and also increase his dose significantly if needed. He is continuing to get a good nights rest, and is taking naps when he is able. He is no longer having headaches since he has been off Nuvigil.   Review of Systems  Constitutional: Negative for fever and unexpected weight change.  HENT: Negative for congestion, dental problem, ear pain, nosebleeds, postnasal drip, rhinorrhea, sinus pressure, sneezing, sore throat and trouble swallowing.   Eyes: Negative for redness and itching.  Respiratory: Negative for cough, chest tightness, shortness of breath and wheezing.   Cardiovascular: Negative for palpitations and leg swelling.  Gastrointestinal: Negative for nausea and vomiting.  Genitourinary: Negative for dysuria.  Musculoskeletal: Negative for joint swelling.  Skin: Negative for rash.  Neurological: Negative for headaches.  Hematological: Does not bruise/bleed easily.  Psychiatric/Behavioral: Negative for dysphoric mood. The patient is not nervous/anxious.        Objective:   Physical Exam Thin male in no acute distress Nose without purulence or discharge noted Neck without lymphadenopathy or thyromegaly Lower extremities without edema, no cyanosis Alert, does not appear to be sleepy, moves all 4 extremities.       Assessment & Plan:

## 2014-03-22 ENCOUNTER — Telehealth: Payer: Self-pay | Admitting: Pulmonary Disease

## 2014-03-22 NOTE — Telephone Encounter (Signed)
I spoke to the pt and advised of recs. [place], CMA

## 2014-03-22 NOTE — Telephone Encounter (Signed)
Can increase med to 20mg  in am and stay on 10mg  in the afternoon.  We want him to be able to go to sleep in the evening.

## 2014-03-22 NOTE — Telephone Encounter (Signed)
Patient Instructions     Will increase your adderall dose to 10mg  in the morning and 10mg  in the afternoon. If you feel you are still having a lot of sleepiness, let us know. Continue getting adequate sleep at night, and taking short naps if possible during the day.  followup with me again in 62mos   Pt states he was to call and give update on Adderall dosing to [facility]. Pt states he has been using adderall 10 mg BID as told by [facility] and this helped for a couple of days; however, patient is now having trouble staying alert. HE is taking naps at lunch time and going to bed at 9pm. Pt feels like he could just lay down during the day and go to sleep. Pt is sleeping fairly well through the night overall.  Would like recs from [facility] on what to do next.

## 2014-03-22 NOTE — Telephone Encounter (Signed)
lmtcb for pt.  

## 2014-04-01 ENCOUNTER — Telehealth: Payer: Self-pay | Admitting: Pulmonary Disease

## 2014-04-01 NOTE — Telephone Encounter (Signed)
Per 03/16/14 OV: Patient Instructions       Will increase your adderall dose to 10mg  in the morning and 10mg  in the afternoon.  If you feel you are still having a lot of sleepiness, let us know. Continue getting adequate sleep at night, and taking short naps if possible during the day.   followup with me again in 2mos  --  lmomtcb x1 for pt We gave him RX on 03/16/14 for 1 tab BID #60

## 2014-04-01 NOTE — Telephone Encounter (Signed)
Pt returned call - [phone]

## 2014-04-01 NOTE — Telephone Encounter (Signed)
Pt returned call again - [phone]

## 2014-04-01 NOTE — Telephone Encounter (Signed)
Spoke with the pt  He reports the 10 mg adderall bid is not helping with alertness  He feels ok first thing in the am, but as the day goes on gets sleepier and sleepier  He naps every day from 12:30 to 1:30 and also at 3 pm takes a 20 min nap  I advised Iraan out of the office until 04/05/14, and will address when he returns He has enough med to last until then and ok with this plan  [place], please advise, thanks!

## 2014-04-01 NOTE — Telephone Encounter (Signed)
LMTCB

## 2014-04-05 NOTE — Telephone Encounter (Signed)
Pt is calling back about his adderall [phone]

## 2014-04-06 NOTE — Telephone Encounter (Signed)
Spoke with pt, advised him that [facility] is back in office today and that we would let him know what KC's recs are about his dosage of adderall once we have these recs.  [place] please advise.  Thanks!!

## 2014-04-06 NOTE — Telephone Encounter (Signed)
See note 12/7.  He is supposed to be on 20mg  in am and 10mg  in pm.  See if he is taking this.

## 2014-04-06 NOTE — Telephone Encounter (Signed)
lmomtcb for pt - msg has been sent to Dr. Gwenette Greet to advise.

## 2014-04-07 NOTE — Telephone Encounter (Signed)
Pt aware of rec's per [place]. States that he will need a refill soon and it will need to be authorized by Dr Gwenette Greet or any physician covering d/t it being an early refill. Pt states that when dose was changed to higher qty of tablets taken daily, we did not send in additional tablets to cover this increased dose so he will run out sooner. Pt is increasing dose again per [facility] and will need Rx written for additional tablets.  Pt is unsure exactly of how many pills he has left, will call back in the morning to let us know exact amount of days he has left. Will leave in triage to await call back.

## 2014-04-07 NOTE — Telephone Encounter (Signed)
Pt states that he is currently taking 20mg  in AM and 10mg  in PM and is still struggling with break thru sleepiness. Pt states that he takes about a 33min nap daily around 12:30 and then again for 86min around 3pm. Pt states that by about 2:30 every day even after long nap he is very sleepy. Pt wanting to know if dose can be increased to 20mg  TID? Pt states that by the time he gets home after work he can go straight to bed. With a newborn at home he struggles to help his wife because he is so tired.   [place]-please advise. Thanks.

## 2014-04-07 NOTE — Telephone Encounter (Signed)
We can increase his dose to 20mg  in am and pm  Make sure he understands that pts with narcolepsy are NEVER not sleepy.  There is no medication that will take his sleepiness away.  He is supposed to take naps when he can to help with sleepiness.  He also needs to make sure that he gets good sleep at night.

## 2014-09-17 ENCOUNTER — Ambulatory Visit: Payer: BC Managed Care – PPO | Admitting: Pulmonary Disease

## 2014-12-03 ENCOUNTER — Encounter (HOSPITAL_COMMUNITY): Payer: Self-pay | Admitting: Emergency Medicine

## 2014-12-03 ENCOUNTER — Emergency Department (HOSPITAL_COMMUNITY)
Admission: EM | Admit: 2014-12-03 | Discharge: 2014-12-05 | Disposition: A | Discharge status: Other Acute Inpatient Hospital | Payer: PRIVATE HEALTH INSURANCE | Attending: Emergency Medicine | Admitting: Emergency Medicine

## 2014-12-03 DIAGNOSIS — F329 Major depressive disorder, single episode, unspecified: Secondary | ICD-10-CM | POA: Insufficient documentation

## 2014-12-03 DIAGNOSIS — F1210 Cannabis abuse, uncomplicated: Secondary | ICD-10-CM | POA: Insufficient documentation

## 2014-12-03 DIAGNOSIS — Z87891 Personal history of nicotine dependence: Secondary | ICD-10-CM | POA: Insufficient documentation

## 2014-12-03 DIAGNOSIS — Z87442 Personal history of urinary calculi: Secondary | ICD-10-CM | POA: Insufficient documentation

## 2014-12-03 DIAGNOSIS — Z79899 Other long term (current) drug therapy: Secondary | ICD-10-CM | POA: Insufficient documentation

## 2014-12-03 DIAGNOSIS — Z8619 Personal history of other infectious and parasitic diseases: Secondary | ICD-10-CM | POA: Insufficient documentation

## 2014-12-03 DIAGNOSIS — F1510 Other stimulant abuse, uncomplicated: Secondary | ICD-10-CM | POA: Insufficient documentation

## 2014-12-03 DIAGNOSIS — F419 Anxiety disorder, unspecified: Secondary | ICD-10-CM | POA: Insufficient documentation

## 2014-12-03 DIAGNOSIS — F32A Depression, unspecified: Secondary | ICD-10-CM

## 2014-12-03 LAB — COMPREHENSIVE METABOLIC PANEL
ALK PHOS: 42 U/L (ref 38–126)
ALT: 41 U/L (ref 17–63)
AST: 39 U/L (ref 15–41)
Albumin: 4.5 g/dL (ref 3.5–5.0)
Anion gap: 8 (ref 5–15)
BILIRUBIN TOTAL: 0.3 mg/dL (ref 0.3–1.2)
BUN: 17 mg/dL (ref 6–20)
CALCIUM: 9.4 mg/dL (ref 8.9–10.3)
CHLORIDE: 105 mmol/L (ref 101–111)
CO2: 25 mmol/L (ref 22–32)
CREATININE: 0.98 mg/dL (ref 0.61–1.24)
Glucose, Bld: 126 mg/dL — ABNORMAL HIGH (ref 65–99)
Potassium: 4 mmol/L (ref 3.5–5.1)
Sodium: 138 mmol/L (ref 135–145)
TOTAL PROTEIN: 7.3 g/dL (ref 6.5–8.1)

## 2014-12-03 LAB — RAPID URINE DRUG SCREEN, HOSP PERFORMED
AMPHETAMINES: POSITIVE — AB
Barbiturates: NOT DETECTED
Benzodiazepines: NOT DETECTED
Cocaine: NOT DETECTED
Opiates: NOT DETECTED
Tetrahydrocannabinol: POSITIVE — AB

## 2014-12-03 LAB — ACETAMINOPHEN LEVEL: Acetaminophen (Tylenol), Serum: <10 ug/mL — ABNORMAL LOW (ref 10–30)

## 2014-12-03 LAB — CBC
HCT: 41.2 % (ref 39.0–52.0)
Hemoglobin: 14.5 g/dL (ref 13.0–17.0)
MCH: 30.8 pg (ref 26.0–34.0)
MCHC: 35.2 g/dL (ref 30.0–36.0)
MCV: 87.5 fL (ref 78.0–100.0)
PLATELETS: 350 10*3/uL (ref 150–400)
RBC: 4.71 MIL/uL (ref 4.22–5.81)
RDW: 12.2 % (ref 11.5–15.5)
WBC: 11.7 10*3/uL — ABNORMAL HIGH (ref 4.0–10.5)

## 2014-12-03 LAB — ETHANOL

## 2014-12-03 LAB — SALICYLATE LEVEL: Salicylate Lvl: <4 mg/dL (ref 2.8–30.0)

## 2014-12-03 MED ORDER — CITALOPRAM HYDROBROMIDE 40 MG PO TABS
40.0000 mg | ORAL_TABLET | Freq: Every day | ORAL | Status: DC
  Administered 2014-12-04 – 2014-12-05 (×2): 40 mg via ORAL
  Filled 2014-12-03 (×3): qty 1

## 2014-12-03 MED ORDER — LORAZEPAM 1 MG PO TABS
1.0000 mg | ORAL_TABLET | Freq: Three times a day (TID) | ORAL | Status: DC | PRN
  Administered 2014-12-04 (×3): 1 mg via ORAL
  Filled 2014-12-03 (×2): qty 1

## 2014-12-03 MED ORDER — NICOTINE 21 MG/24HR TD PT24
21.0000 mg | MEDICATED_PATCH | Freq: Every day | TRANSDERMAL | Status: DC
  Administered 2014-12-04 – 2014-12-05 (×2): 21 mg via TRANSDERMAL
  Filled 2014-12-03 (×2): qty 1

## 2014-12-03 MED ORDER — BUPRENORPHINE HCL-NALOXONE HCL 2.9-0.71 MG SL SUBL: 1.0000 | SUBLINGUAL_TABLET | Freq: Every day | SUBLINGUAL | Status: DC

## 2014-12-03 MED ORDER — AMPHETAMINE-DEXTROAMPHET ER 10 MG PO CP24
30.0000 mg | ORAL_CAPSULE | Freq: Every day | ORAL | Status: DC
  Administered 2014-12-04 – 2014-12-05 (×2): 30 mg via ORAL
  Filled 2014-12-03 (×2): qty 3

## 2014-12-03 MED ORDER — LORAZEPAM 1 MG PO TABS
1.0000 mg | ORAL_TABLET | Freq: Three times a day (TID) | ORAL | Status: DC | PRN
  Administered 2014-12-05: 1 mg via ORAL
  Filled 2014-12-03 (×2): qty 1

## 2014-12-03 MED ORDER — IBUPROFEN 200 MG PO TABS
600.0000 mg | ORAL_TABLET | Freq: Three times a day (TID) | ORAL | Status: DC | PRN
  Administered 2014-12-04 – 2014-12-05 (×2): 600 mg via ORAL
  Filled 2014-12-03 (×2): qty 3

## 2014-12-03 MED ORDER — ONDANSETRON HCL 4 MG PO TABS: 4.0000 mg | ORAL_TABLET | Freq: Three times a day (TID) | ORAL | Status: DC | PRN

## 2014-12-03 MED ORDER — ZOLPIDEM TARTRATE 5 MG PO TABS
5.0000 mg | ORAL_TABLET | Freq: Every evening | ORAL | Status: DC | PRN
  Administered 2014-12-04 (×2): 5 mg via ORAL
  Filled 2014-12-03 (×2): qty 1

## 2014-12-03 NOTE — ED Notes (Signed)
Pt is visibly anxious and upset during assessment; he states that he is feeling very discouraged and "down on himself" lately and that he has contemplated suicide repeatedly over the past few months.  He planned yesterday to jump in front of traffic but felt guilty about the consequences for the drivers involved so he did not follow through with his plan.  He reports recent feelings of paranoia, worrying that his coworkers have been talking about him behind his back.  He states that he has not actually heard them talking negatively about him and denies hallucinations.  He is tearful and reports that he is in a deep depression.  He formerly self-medicated but states that he has been sober for about three years now and does not want to resort to substance abuse to deal with his problems.  He reports strong feelings of "guilt and shame about a lot of things in his life right now."  Actively SI, no HI.  Calm, cooperative, and quiet but clearly upset throughout assessment.

## 2014-12-03 NOTE — ED Notes (Signed)
Pt c/o increased depression and "not feeling safe at home". States has ideations of "walking in front of a car". History of cutting and substance abuse.
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
## 2014-12-03 NOTE — ED Notes (Signed)
Staffing notified of sitter need 

## 2014-12-04 NOTE — ED Notes (Signed)
Resting quietly with eye closed. Easily arousable. Verbally responsive. Resp even and unlabored. ABC's intact. No behavior problems noted. Pt eaten 100% of lunch. Pt reported having headache. Sitter at bedside.

## 2014-12-04 NOTE — ED Notes (Addendum)
Resting quietly with eye closed. Easily arousable. Verbally responsive. Resp even and unlabored. ABC's intact. No behavior problems noted. NAD noted. Sitter at bedside.  

## 2014-12-04 NOTE — Progress Notes (Signed)
Disposition CSW completed patient referrals to the following inpatient psych facilities:  [facility]  CSW will continue to assist with placement needs.  [place] Disposition CSW [phone]

## 2014-12-04 NOTE — ED Notes (Signed)
Awake. Interacting with staff. Verbally responsive. Resp even and unlabored. ABC's intact. No behavior problems noted. NAD noted. Sitter at bedside.

## 2014-12-04 NOTE — ED Notes (Signed)
Resting quietly with eye closed. Easily arousable. Verbally responsive. Resp even and unlabored. ABC's intact. No behavior problems noted. NAD noted. Sitter at bedside.  

## 2014-12-04 NOTE — ED Notes (Signed)
Awake. Watching TV and interacting with staff. Verbally responsive. Resp even and unlabored. ABC's intact. No behavior problems noted. NAD noted. Sitter at bedside. Family at bedside.

## 2014-12-04 NOTE — ED Notes (Signed)
Awake. Verbally responsive.  Verbally responsive. Resp even and unlabored. ABC's intact. No behavior problems noted. NAD noted. Sitter at bedside.

## 2014-12-04 NOTE — ED Notes (Addendum)
Pt is resting comfortably in bed.  Equal chest rise and fall noted.  Pt is still not able to sleep at this time. No acute distress.

## 2014-12-04 NOTE — ED Notes (Signed)
Pt continues to rest in bed but is still unable to sleep despite medication administration earlier.  No acute distress noted.
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
## 2014-12-04 NOTE — ED Notes (Signed)
Resting quietly with eye closed. Easily arousable. Verbally responsive. Resp even and unlabored. ABC's intact. No behavior problems noted. NAD noted. Sitter at bedside. Pt reported having SI without HI with plans to walk in front of a car. Pt stated that he was really depressed and that he has been hearing voices but denies visual hallucinations.

## 2014-12-05 ENCOUNTER — Encounter (HOSPITAL_COMMUNITY): Payer: Self-pay | Admitting: *Deleted

## 2014-12-05 ENCOUNTER — Inpatient Hospital Stay (HOSPITAL_COMMUNITY)
Admission: AD | Admit: 2014-12-05 | Discharge: 2014-12-09 | DRG: 885 | Disposition: A | Discharge status: Home / Self Care | Payer: Federal, State, Local not specified - Other | Source: Intra-hospital | Attending: Psychiatry | Admitting: Psychiatry

## 2014-12-05 DIAGNOSIS — F332 Major depressive disorder, recurrent severe without psychotic features: Principal | ICD-10-CM | POA: Insufficient documentation

## 2014-12-05 DIAGNOSIS — Z87891 Personal history of nicotine dependence: Secondary | ICD-10-CM | POA: Diagnosis not present

## 2014-12-05 DIAGNOSIS — F322 Major depressive disorder, single episode, severe without psychotic features: Secondary | ICD-10-CM | POA: Diagnosis present

## 2014-12-05 DIAGNOSIS — F410 Panic disorder [episodic paroxysmal anxiety] without agoraphobia: Secondary | ICD-10-CM | POA: Diagnosis not present

## 2014-12-05 DIAGNOSIS — F419 Anxiety disorder, unspecified: Secondary | ICD-10-CM | POA: Diagnosis present

## 2014-12-05 MED ORDER — LORAZEPAM 1 MG PO TABS
1.0000 mg | ORAL_TABLET | Freq: Three times a day (TID) | ORAL | Status: DC | PRN
  Administered 2014-12-05 – 2014-12-06 (×2): 1 mg via ORAL
  Filled 2014-12-05 (×2): qty 1

## 2014-12-05 MED ORDER — TRAZODONE HCL 50 MG PO TABS
50.0000 mg | ORAL_TABLET | Freq: Every evening | ORAL | Status: DC | PRN
  Administered 2014-12-05 – 2014-12-09 (×4): 50 mg via ORAL
  Filled 2014-12-05: qty 1
  Filled 2014-12-05: qty 6
  Filled 2014-12-05 (×3): qty 1

## 2014-12-05 MED ORDER — MAGNESIUM HYDROXIDE 400 MG/5ML PO SUSP: 30.0000 mL | Freq: Every day | ORAL | Status: DC | PRN

## 2014-12-05 MED ORDER — ALUM & MAG HYDROXIDE-SIMETH 200-200-20 MG/5ML PO SUSP: 30.0000 mL | ORAL | Status: DC | PRN

## 2014-12-05 MED ORDER — CITALOPRAM HYDROBROMIDE 40 MG PO TABS
40.0000 mg | ORAL_TABLET | Freq: Every day | ORAL | Status: DC
  Administered 2014-12-06 – 2014-12-09 (×4): 40 mg via ORAL
  Filled 2014-12-05 (×2): qty 1
  Filled 2014-12-05: qty 3
  Filled 2014-12-05 (×4): qty 1

## 2014-12-05 MED ORDER — ACETAMINOPHEN 325 MG PO TABS
650.0000 mg | ORAL_TABLET | Freq: Four times a day (QID) | ORAL | Status: DC | PRN
  Administered 2014-12-06 – 2014-12-09 (×4): 650 mg via ORAL
  Filled 2014-12-05 (×4): qty 2

## 2014-12-05 MED ORDER — AMPHETAMINE-DEXTROAMPHET ER 10 MG PO CP24
30.0000 mg | ORAL_CAPSULE | Freq: Every day | ORAL | Status: DC
  Administered 2014-12-06 – 2014-12-09 (×4): 30 mg via ORAL
  Filled 2014-12-05 (×5): qty 3

## 2014-12-05 NOTE — Progress Notes (Signed)
Patient ID: Taiyo Kozma, male   DOB: November 07, 1980, 34 y.o.   MRN: [id number]   34 year old white male admitted after he presented to [facility] reporting SI with plan to cut self. It was reported that patients mental health status had declined, and that he needed help. Once at [facility] patient reported that he is very impulsive, depressed, and severely anxious. Pt reported that for the last six months he has declined, and that things just got bad for him. Pt reported that he has not been hisself, and that his family agreed that he was not himself. Pt reported a history of cutting and suicide attempts from overdose to wanting to jump into traffic. Pt reported that he has been having bad dreams about killing himself, and waking up wanting to kill self. Pt reported that he was severely overwhelmed and that he just needed help. Pt reported at time of admission that he was negative SI/HI, no AH/VH noted.

## 2014-12-05 NOTE — BHH Counselor (Signed)
[facility] Assessment Progress Note  Pt has been accepted to [facility], 407-[facility], per Letitia Libra, [facility]. Support paperwork has been signed and faxed over to [facility]. Pt's nurse, Shirlean Mylar has been notified and can call report to [phone].  Kenna Gilbert. Lovena Le, [place], [place], LPCA Counselor

## 2014-12-05 NOTE — Progress Notes (Signed)
D) Pt. Has been noted resting in bed since admission, but did get up to go to dinner with peers.  Currently sitting in the dayroom.  Pt. Reports some anxiety, but refuses to take medication at this time, stating "I want to wait until bedtime and take it with my other medication". Pt. Also c/o 4/10 back pain, stating it is chronic.  A) Support given.  Ice pack provided.  R) Pt. Continues on q 15 min. Observations and is safe at this time.

## 2014-12-05 NOTE — ED Notes (Signed)
Pt awake and feels anxious.  Requested Ativan.  Ativan given along with his other morning medications.  No other needs expressed.  Will continue to monitor.  Sitter at bedside

## 2014-12-05 NOTE — BHH Counselor (Signed)
[facility] Assessment Progress Note  Counselor reassessed patient this morning. He continues to endorse SI. When asked about a plan, he shared that he had a dream last night about cutting a vein in his leg. He denies HI. He endorses AH, stating that he hears laughing in his head and thinks that people are talking about him.   Consulted with Dr. Adele Schilder and Reginold Agent, NP. Pt continues to meet IP criteria.   Kenna Gilbert. Lovena Le, [place], [place], LPCA Counselor

## 2014-12-05 NOTE — BHH Group Notes (Signed)
[facility] Group Notes:  (Clinical Social Work)  12/05/2014  1:15-2:15PM  Summary of Progress/Problems:   The main focus of today's process group was to   1)  discuss the importance of adding supports  2)  define health supports versus unhealthy supports  3)  identify the patient's current unhealthy supports and plan how to handle them  4)  Identify the patient's current healthy supports and plan what to add.  An emphasis was placed on using counselor, doctor, therapy groups, 12-step groups, and problem-specific support groups to expand supports.    The patient expressed full comprehension of the concepts presented, and agreed that there is a need to add more supports.  The patient stated his girlfriend, their baby, her parents, and his psychiatrist are good supports.  He talked about using cutting as coping technique.  He curled up in chair and listened/contributed from time to time, but with eyes closed.  Type of Therapy:  Process Group with Motivational Interviewing  Participation Level:  Active  Participation Quality:  Attentive  Affect:  Depressed and Flat  Cognitive:  Appropriate  Insight:  Developing/Improving  Engagement in Therapy: Developing/Improving  Modes of Intervention:   Education, Support and Processing, Activity  Selmer Dominion, LCSW 12/05/2014

## 2014-12-05 NOTE — Tx Team (Addendum)
Initial Interdisciplinary Treatment Plan   PATIENT STRESSORS: Health problems Medication change or noncompliance   PATIENT STRENGTHS: Ability for insight Average or above average intelligence   PROBLEM LIST: Problem List/Patient Goals Date to be addressed Date deferred Reason deferred Estimated date of resolution  Depression 12/05/14     SI 12/05/14                       Goal- Patient reports " I just want to feel better and work on my self esteem."                         DISCHARGE CRITERIA:  Improved stabilization in mood, thinking, and/or behavior Need for constant or close observation no longer present  PRELIMINARY DISCHARGE PLAN: Return to previous living arrangement  PATIENT/FAMIILY INVOLVEMENT: This treatment plan has been presented to and reviewed with the patient, Alexa Golebiewski, and/or family member.  The patient and family have been given the opportunity to ask questions and make suggestions.  Benancio Deeds Shanta 12/05/2014, 1:31 PM

## 2014-12-05 NOTE — ED Notes (Signed)
[facility] contacted for transport pick up by this RN.

## 2014-12-05 NOTE — ED Notes (Signed)
Pt sleeping. Sitter at bedside.

## 2014-12-05 NOTE — ED Notes (Signed)
Pt sleeping.  Does not want to eat breakfast at this time.  NAD noted.  Sitter at bedside

## 2014-12-05 NOTE — Progress Notes (Signed)
Did not attend group, remained in room resting.

## 2014-12-06 ENCOUNTER — Encounter (HOSPITAL_COMMUNITY): Payer: Self-pay | Admitting: Psychiatry

## 2014-12-06 DIAGNOSIS — F332 Major depressive disorder, recurrent severe without psychotic features: Principal | ICD-10-CM

## 2014-12-06 DIAGNOSIS — F410 Panic disorder [episodic paroxysmal anxiety] without agoraphobia: Secondary | ICD-10-CM

## 2014-12-06 MED ORDER — ONDANSETRON 4 MG PO TBDP
4.0000 mg | ORAL_TABLET | Freq: Once | ORAL | Status: AC
  Administered 2014-12-06: 4 mg via ORAL
  Filled 2014-12-06 (×2): qty 1

## 2014-12-06 MED ORDER — LORAZEPAM 1 MG PO TABS
1.0000 mg | ORAL_TABLET | Freq: Two times a day (BID) | ORAL | Status: DC | PRN
  Administered 2014-12-06 – 2014-12-09 (×5): 1 mg via ORAL
  Filled 2014-12-06 (×5): qty 1

## 2014-12-06 NOTE — Progress Notes (Signed)
Recreation Therapy Notes  Date: 08.22.2016 Time: 9:30am Location: [address] Dayroom   Group Topic: Stress Management  Goal Area(s) Addresses:  Patient will actively participate in stress management techniques presented during session.   Behavioral Response: Did not attend.   Tad Fancher L Shali Vesey, LRT/CTRS  Jimi Schappert L 12/06/2014 1:55 PM 

## 2014-12-06 NOTE — Plan of Care (Signed)
Problem: Diagnosis: Increased Risk For Suicide Attempt Goal: LTG-Patient Will Report Improved Mood and Deny Suicidal LTG (by discharge) Patient will report improved mood and deny suicidal ideation.  Outcome: Progressing Pt denies si thoughts

## 2014-12-06 NOTE — BHH Suicide Risk Assessment (Signed)
[place] INPATIENT:  Family/Significant Other Suicide Prevention Education  Suicide Prevention Education:  Patient Refusal for Family/Significant Other Suicide Prevention Education: The patient [name] has refused to provide written consent for family/significant other to be provided Family/Significant Other Suicide Prevention Education during admission and/or prior to discharge.  Physician notified. SPE reviewed with patient and brochure provided. Patient encouraged to return to hospital if having suicidal thoughts, patient verbalized his/her understanding and has no further questions at this time.   Bo Mcclintock 12/06/2014, 3:56 PM

## 2014-12-06 NOTE — BHH Group Notes (Signed)
[facility] LCSW Aftercare Discharge Planning Group Note  12/06/2014 8:45 AM  Participation Quality: Alert, Appropriate and Oriented  Mood/Affect: Flat  Depression Rating: 5  Anxiety Rating: 4  Thoughts of Suicide: Pt denies SI/HI  Will you contract for safety? Yes  Current AVH: Pt denies  Plan for Discharge/Comments: Pt attended discharge planning group and actively participated in group. CSW discussed suicide prevention education with the group and encouraged them to discuss discharge planning and any relevant barriers. Pt reports feeling "okay"; has established outpatient providers at this time. Flat affect and minimal interaction.  Transportation Means: Pt reports access to transportation  Supports: No supports mentioned at this time  Peri Maris, [place] 12/06/2014 9:41 AM

## 2014-12-06 NOTE — Progress Notes (Signed)
Pt c/o nausea and vomiting since returning from dinner. Took vitals and alerted NP. New orders placed. Report given to oncoming nurse. Safety maintained.

## 2014-12-06 NOTE — Progress Notes (Signed)
Pt in bed at this time, still complaining of abdominal pain, does state that the pain is slightly better at this time. Pt did request bedtime medication as well as ativan for anxiety which he did receive. Pt has been provided with fluids throughout the evening and has been drinking. No other complaints at this time.

## 2014-12-06 NOTE — Progress Notes (Addendum)
Pt in bed at this time, c/o vomiting, did receive zofran pt verbalizes that it has not been helpful thus far. Pt reports persistent vomiting since shortly after dinner, no abdominal tenderness noted, pt did report he has already had appendix removed, pt complaints of sharp cramping pain in abdominal area.
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
## 2014-12-06 NOTE — Progress Notes (Signed)
Writer entered patients room and observed him lying in bed resting. He was very sad and tearful during our conversation. He reports that he feels that his medication is not working.and his mental health has gotten worst the last 6 months. He reports that he wants to work on on himself and his self esteem. He currently denies si/hi/a/v hallucinations. Support given along with prn of ativan for anxiety. Safety maintained on unit with 15 min checks.

## 2014-12-06 NOTE — Plan of Care (Signed)
Problem: Diagnosis: Increased Risk For Suicide Attempt Goal: STG-Patient Will Attend All Groups On The Unit Outcome: Not Progressing Patient did not attend evening group. He was feeling very anxious and tearful. Reports he will work on going to groups on tomorrow. Patient requested medication for anxiety which he received.

## 2014-12-06 NOTE — BHH Suicide Risk Assessment (Signed)
[facility] Admission Suicide Risk Assessment   Nursing information obtained from:  Patient Demographic factors:  Male, Caucasian Current Mental Status:  NA Loss Factors:  Financial problems / change in socioeconomic status Historical Factors:  Prior suicide attempts Risk Reduction Factors:  Responsible for children under 32 years of age, Living with another person, especially a relative Total Time spent with patient: 45 minutes Principal Problem:  MDD recurrent , severe, no psychotic features Diagnosis:   Patient Active Problem List   Diagnosis Date Noted  . Anxiety disorder [F41.9] 12/05/2014  . Major depressive disorder, recurrent episode, severe [F33.2] 12/05/2014  . 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  World Pharmacologist [facility]). Score between 0-7:  no or low risk or alcohol related problems. Score between 8-15:  moderate risk of alcohol related problems. Score between 16-19:  high risk of alcohol related problems. Score 20 or above:  warrants further diagnostic evaluation for alcohol dependence and treatment.   CLINICAL FACTORS:  34 year old man, reports worsening depression , anxiety, with recent onset of passive suicidal ruminations and concerns about potentially relapsing and deteriorating after doing well in his recovery for many months. Facing significant stressors, particularly regarding stressful job and financial difficulties .      Psychiatric Specialty Exam: Physical Exam  ROS  Blood pressure 122/89, pulse 70, temperature 98.4 F (36.9 C), temperature source Oral, resp. rate 16, height 5\' 8"  (1.727 m), weight 160 lb (72.576 kg).Body mass index is 24.33 kg/(m^2).  See Admit Note MSE                                                        COGNITIVE  FEATURES THAT CONTRIBUTE TO RISK:  Closed-mindedness and Loss of executive function    SUICIDE RISK:   Moderate:  Frequent suicidal ideation with limited intensity, and duration, some specificity in terms of plans, no associated intent, good self-control, limited dysphoria/symptomatology, some risk factors present, and identifiable protective factors, including available and accessible social support.  PLAN OF CARE: Patient will be admitted to inpatient psychiatric unit for stabilization and safety. Will provide and encourage milieu participation. Provide medication management and maked adjustments as needed.  Will follow daily.    Medical Decision Making:  Review of Psycho-Social Stressors (1), Review or order clinical lab tests (1), Established Problem, Worsening (2) and Review of Medication Regimen & Side Effects (2)  I certify that inpatient services furnished can reasonably be expected to improve the patient's condition.   COBOS, Felicita Gage 12/06/2014, 7:53 PM
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
## 2014-12-06 NOTE — Progress Notes (Signed)
Pt was in his room most of the morning.  Pt observed after lunch playing cards with his peers on the unit. Pt rates his depression as a 5 and anxiety as a 4 on 0-10 scale with 10 being the most. He has a flat/depressed affect.  A:Offered support, encouragement and 15 minute checks. R:Pt denies si and hi. Safety maintained on the unit.

## 2014-12-06 NOTE — BHH Group Notes (Signed)
[facility] LCSW Group Therapy  12/06/2014 1:15pm  Type of Therapy:  Group Therapy vercoming Obstacles  Pt did not attend, declined invitation.   Peri Maris, LCSWA 12/06/2014 5:22 PM

## 2014-12-07 LAB — CBC WITH DIFFERENTIAL/PLATELET
Basophils Absolute: 0 10*3/uL (ref 0.0–0.1)
Basophils Relative: 0 % (ref 0–1)
EOS PCT: 0 % (ref 0–5)
Eosinophils Absolute: 0.1 10*3/uL (ref 0.0–0.7)
HEMATOCRIT: 44.1 % (ref 39.0–52.0)
Hemoglobin: 15.8 g/dL (ref 13.0–17.0)
LYMPHS ABS: 2.9 10*3/uL (ref 0.7–4.0)
LYMPHS PCT: 18 % (ref 12–46)
MCH: 31.2 pg (ref 26.0–34.0)
MCHC: 35.8 g/dL (ref 30.0–36.0)
MCV: 87.2 fL (ref 78.0–100.0)
MONO ABS: 1.2 10*3/uL — AB (ref 0.1–1.0)
MONOS PCT: 8 % (ref 3–12)
Neutro Abs: 11.9 10*3/uL — ABNORMAL HIGH (ref 1.7–7.7)
Neutrophils Relative %: 74 % (ref 43–77)
PLATELETS: 379 10*3/uL (ref 150–400)
RBC: 5.06 MIL/uL (ref 4.22–5.81)
RDW: 12.3 % (ref 11.5–15.5)
WBC: 16.1 10*3/uL — ABNORMAL HIGH (ref 4.0–10.5)

## 2014-12-07 LAB — BASIC METABOLIC PANEL
Anion gap: 8 (ref 5–15)
BUN: 13 mg/dL (ref 6–20)
CALCIUM: 9.6 mg/dL (ref 8.9–10.3)
CO2: 29 mmol/L (ref 22–32)
Chloride: 102 mmol/L (ref 101–111)
Creatinine, Ser: 0.86 mg/dL (ref 0.61–1.24)
GFR calc Af Amer: >60 mL/min (ref >60)
GLUCOSE: 90 mg/dL (ref 65–99)
Potassium: 3.9 mmol/L (ref 3.5–5.1)
Sodium: 139 mmol/L (ref 135–145)

## 2014-12-07 MED ORDER — PROMETHAZINE HCL 25 MG RE SUPP
25.0000 mg | Freq: Once | RECTAL | Status: AC
  Administered 2014-12-07: 25 mg via RECTAL
  Filled 2014-12-07: qty 1

## 2014-12-07 MED ORDER — DICYCLOMINE HCL 20 MG PO TABS
20.0000 mg | ORAL_TABLET | Freq: Three times a day (TID) | ORAL | Status: DC | PRN
  Administered 2014-12-07: 20 mg via ORAL
  Filled 2014-12-07: qty 1
  Filled 2014-12-07: qty 10

## 2014-12-07 NOTE — Tx Team (Signed)
Interdisciplinary Treatment Plan Update (Adult) Date: 12/07/2014   Date: 12/07/2014 5:48 PM  Progress in Treatment:  Attending groups: No, Pt is sick on the unit Participating in groups: No Taking medication as prescribed: Yes  Tolerating medication: Yes  Family/Significant othe contact made: No, Pt declines Patient understands diagnosis: Yes Discussing patient identified problems/goals with staff: Yes  Medical problems stabilized or resolved: Yes  Denies suicidal/homicidal ideation: Yes Patient has not harmed self or Others: Yes   New problem(s) identified: None identified at this time.   Discharge Plan or Barriers: Pt will return home and follow up with [facility].  Additional comments: n/a   Reason for Continuation of Hospitalization:  Anxiety Depression Medication stabilization Suicidal ideation  Estimated length of stay: 3-5 days  Review of initial/current patient goals per problem list:   1.  Goal(s): Patient will participate in aftercare plan  Met:  Yes  Target date: 3-5 days from date of admission   As evidenced by: Patient will participate within aftercare plan AEB aftercare provider and housing plan at discharge being identified.   12/07/14: pt will return home and follow up with outpatient providers.  2.  Goal (s): Patient will exhibit decreased depressive symptoms and suicidal ideations.  Met:  Progressing  Target date: 3-5 days from date of admission   As evidenced by: Patient will utilize self rating of depression at 3 or below and demonstrate decreased signs of depression or be deemed stable for discharge by MD.  12/07/14: Pt rates depression at 5/10 but reports much improved mood and affect  3.  Goal(s): Patient will demonstrate decreased signs and symptoms of anxiety.  Met:  Progressing  Target date: 3-5 days from date of admission   As evidenced by: Patient will utilize self rating of anxiety at 3 or below and  demonstrated decreased signs of anxiety, or be deemed stable for discharge by MD  12/07/14: pt rates anxiety at 4/10. Continues to present with less anxious affect.   Attendees:  Patient:    Family:    Physician: Dr. Parke Poisson, MD  12/07/2014 5:48 PM  Nursing: Lars Pinks, RN Case manager  12/07/2014 5:48 PM  Clinical Social Worker Norman Clay, MSW 12/07/2014 5:48 PM  Other: Jake Bathe Liasion 12/07/2014 5:48 PM  Clinical:  Lise Auer, RN 12/07/2014 5:48 PM  Other: , RN Charge Nurse 12/07/2014 5:48 PM  Other:     Peri Maris, [place] MSW

## 2014-12-07 NOTE — Progress Notes (Signed)
Pt has been in bed and has been unable to get much rest this evening, still complains of abdominal pain but states that the pain is not as intense and has requested some crackers which were provided.

## 2014-12-07 NOTE — Progress Notes (Signed)
D. Pt had been in bed for much of the evening, has been unable to participate in group activity this evening due to nausea and vomiting. Pt has complained of abdominal pain, however does report some relief at this time, reports the pain is not as intense as it was earlier in the evening. Pt in bed and trying to get some rest at this time. A. Support provided throughout the evening. R. Will continue to monitor.

## 2014-12-07 NOTE — BHH Group Notes (Signed)
[facility] LCSW Group Therapy  12/07/2014   1:15 PM   Type of Therapy:  Group Therapy  Participation Level:  Minimal  Participation Quality:  Minimal  Affect:  Depressed and Flat  Cognitive:  Alert and Oriented  Insight:  Developing/Improving and Engaged  Engagement in Therapy:  Developing/Improving and Engaged  Modes of Intervention:  Clarification, Confrontation, Discussion, Education, Exploration, Limit-setting, Orientation, Problem-solving, Rapport Building, Art therapist, Socialization and Support  Summary of Progress/Problems: The topic for group therapy was feelings about diagnosis.  Pt actively participated in group discussion on their past and current diagnosis and how they feel towards this.  Pt also identified how society and family members judge them, based on their diagnosis as well as stereotypes and stigmas. Patient participated minimally in discussion and was observed with his head down, covered up in a blanket. Patient did shared that he experiences panic attacks at work and finds it difficult to explain himself to his coworkers. Patient left group in a hurry early, stating that he did not feel well and did not return.  Tilden Fossa, MSW, [place] Worker [facility] [phone]

## 2014-12-07 NOTE — BHH Group Notes (Signed)
The focus of this group is to educate the patient on the purpose and policies of crisis stabilization and provide a format to answer questions about their admission.  The group details unit policies and expectations of patients while admitted.  Patient did not attend 0900 nurse education orientation group this morning.  Patient stayed in his room. 

## 2014-12-07 NOTE — Progress Notes (Signed)
NSG shift assessment. 7a-7p.   D: Pt unable to participate in programming today because he has had N/V and abd cramping since dinner last night. He believes that he has food poisoning.  He has remained in bed with his head under the covers.   PRN medication helped pt's symptoms and he went the the dining room for lunch. After lunch he began to feel bad again, had some nausea and abd pain, but no more vomiting reported and no diarrhea. VS obtained by dinamap and then manual to confirm results -see flow sheet - were elevated. This reported to Elmarie Shiley NP.   A: Monitored frequently and offered fluids, food and medications as ordered. Support and encouragement offered. Safety maintained with observations every 15 minutes.   R:  Pt remains safe on the unit.

## 2014-12-07 NOTE — Progress Notes (Addendum)
[facility] MD Progress Note  12/07/2014 5:32 PM [name]  MRN:  [id number] Subjective:  Patient states he is feeling better with regards to his mood .  He states he has had nausea, vomiting and some abdominal cramping since yesterday evening. By today he is feeling " a little better". States he has spent a lot of time in bed due to the nausea. He does not think these symptoms are related to medication side effect or to opiate WDL. He states " I think it is something I ate, because I felt sick right after dinner". At this time does not endorse rhinorrhea, yawning, piloerection, malaise , temperature changes such as feeling hot or cold, or other opiate withdrawal type symptoms. He denies medication side effects. Objective : I have discussed case with treatment team and have met with patient. As  Noted, reports acute onset GI symptoms, now improving. Nausea and vomiting have been main symptoms, denies diarrhea, denies any hematemesis. Denies fever or chills and does not appear prostrated or toxic/acutely ill . He states " I am starting to feel better, I will try to make a couple of groups later today". Denies medication side effects   Principal Problem: Major depressive disorder, recurrent episode, severe Diagnosis:   Patient Active Problem List   Diagnosis Date Noted  . Anxiety disorder [F41.9] 12/05/2014  . Major depressive disorder, recurrent episode, severe [F33.2] 12/05/2014  . 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 Opiate dependence   . Hepatitis C   . Anxiety   . Depression   . Kidney stone     Past Surgical History  Procedure Laterality Date  . Appendectomy     Family History:  Family History  Problem Relation Age of Onset  . Kidney cancer Father     renal cell carcenoma  . Hodgkin's lymphoma Mother   . Colon cancer Paternal Grandfather     great  . Lung cancer  Paternal Grandmother    Social History:  History  Alcohol Use  . Yes    Comment: 1 per day     History  Drug Use No    Comment: former user    Social History   Social History  . Marital Status: Single    Spouse Name: N/A  . Number of Children: 0  . Years of Education: N/A   Occupational History  . Dealer    Social History Main Topics  . Smoking status: Former Smoker -- 0.50 packs/day for 9 years    Types: Cigarettes    Quit date: 04/16/2008  . Smokeless tobacco: Former Systems developer    Types: Chew    Quit date: 04/16/2008  . Alcohol Use: Yes     Comment: 1 per day  . Drug Use: No     Comment: former user  . Sexual Activity: Not Asked   Other Topics Concern  . None   Social History Narrative   Additional History:    Sleep: Fair  Appetite:  Fair   Assessment:   Musculoskeletal: Strength & Muscle Tone: within normal limits Gait & Station: normal Patient leans: N/A   Psychiatric Specialty Exam: Physical Exam  ROS nausea, vomiting, no hematemesis, no diarrhea, no fever, no chills   Blood pressure 125/79, pulse 60, temperature 98.7 F (37.1 C), temperature source Oral, resp. rate 20, height _0  (1.727 m), weight 160 lb (72.576 kg).Body mass index  is 24.33 kg/(m^2).  General Appearance: Fairly Groomed  Engineer, water::  Good  Speech:  Normal Rate  Volume:  Normal  Mood:  states he is feeling better, less depressed   Affect:  Constricted and but more reactive   Thought Process:  Linear  Orientation:  Other:  fully alert and attentive   Thought Content:  denies hallucinations and does not appear internally preoccupied   Suicidal Thoughts:  No- at this time denies any self injurious or suicidal thoughts   Homicidal Thoughts:  No  Memory:  recent and remote grossly intact   Judgement:  Fair  Insight:  Present  Psychomotor Activity:  Normal  Concentration:  Good  Recall:  Good  Fund of Knowledge:Good  Language: Good  Akathisia:  Negative  Handed:  Right   AIMS (if indicated):     Assets:  Communication Skills Desire for Improvement Housing Social Support Vocational/Educational  ADL's:   Fair   Cognition: WNL  Sleep:  Number of Hours: 5     Current Medications: Current Facility-Administered Medications  Medication Dose Route Frequency Provider Last Rate Last Dose  . acetaminophen (TYLENOL) tablet 650 mg  650 mg Oral Q6H PRN Harriet Butte, NP   650 mg at 12/06/14 0615  . alum & mag hydroxide-simeth (MAALOX/MYLANTA) 200-200-20 MG/5ML suspension 30 mL  30 mL Oral Q4H PRN Harriet Butte, NP      . amphetamine-dextroamphetamine (ADDERALL XR) 24 hr capsule 30 mg  30 mg Oral Daily Delfin Gant, NP   30 mg at 12/07/14 1132  . citalopram (CELEXA) tablet 40 mg  40 mg Oral Daily Delfin Gant, NP   40 mg at 12/07/14 1133  . dicyclomine (BENTYL) tablet 20 mg  20 mg Oral Q8H PRN Niel Hummer, NP   20 mg at 12/07/14 1132  . LORazepam (ATIVAN) tablet 1 mg  1 mg Oral BID PRN Jenne Campus, MD   1 mg at 12/06/14 2354  . magnesium hydroxide (MILK OF MAGNESIA) suspension 30 mL  30 mL Oral Daily PRN Harriet Butte, NP      . traZODone (DESYREL) tablet 50 mg  50 mg Oral QHS PRN Harriet Butte, NP   50 mg at 12/06/14 2354    Lab Results: No results found for this or any previous visit (from the past 48 hour(s)).  Physical Findings: AIMS:  , ,  ,  ,    CIWA:    COWS:      Assessment - at this time patient reports partial improvement of mood , feels better than upon admission. He has developed nausea and vomiting, now improved, which he attributes to food poisoning, rather than to any lingering opiate withdrawal or medication side effect. He is feeling better now, and of note, has no fever and vitals remain stable . He is tolerating medications well. Remains motivated and focused on sobriety, recovery.   Treatment Plan Summary: Daily contact with patient to assess and evaluate symptoms and progress in treatment, Medication  management, Plan psychiatric inpatient management  and medications as below Continue Trazodone 50 mgrs QHS PRN for insomnia  Continue Ativan 1 mgr BID PRN for Anxiety as needed Continue Celexa 40 mgrs QDAY for management of anxiety and depression Continue Adderall XR 30 mgrs QDAY for management of Narcolepsy On Bentyl PRNs for symptomatic relief of nausea, GI symptoms. Encourage PO fluids . Will obtain CBC and BMP to monitor, as has developed acute nausea and vomiting   Medical  Decision Making:  Established Problem, Stable/Improving (1), New problem, with additional work up planned, Review of Psycho-Social Stressors (1), Review or order clinical lab tests (1) and Review of Medication Regimen & Side Effects (2)     [name] 12/07/2014, 5:32 PM

## 2014-12-07 NOTE — Progress Notes (Signed)
Patient in bed resting. He did not attend group. He reported that he was feeling sick all day. He said he felt he ate something that made him sick. He denied SI/HI and denied Hallucinations. Writer encouraged and supported patient. Q 15 minute check continues as ordered to maintain safety.

## 2014-12-07 NOTE — Progress Notes (Signed)
Recreation Therapy Notes  Animal-Assisted Activity (AAA) Program Checklist/Progress Notes Patient Eligibility Criteria Checklist & Daily Group note for Rec Tx Intervention  Date: 08.23.2016 Time: 2:45pm Location: [address]    AAA/T Program Assumption of Risk Form signed by Patient/ or Parent Legal Guardian yes  Patient is free of allergies or sever asthma yes  Patient reports no fear of animals yes  Patient reports no history of cruelty to animals yes  Patient understands his/her participation is voluntary yes  Patient washes hands before animal contact yes  Patient washes hands after animal contact yes  Behavioral Response: Did not attend.   [name] [name], LRT/CTRS  [name] L 12/07/2014 3:07 PM

## 2014-12-07 NOTE — Progress Notes (Signed)
Pt reports N/V all night and vomited as recently as 15 minutes ago. He is in distress because he feels so bad. He believes that he has food poisoning from the meal last night because the symptoms started after dinner.

## 2014-12-08 NOTE — Progress Notes (Signed)
[facility] Group Notes:  (Nursing/MHT/Case Management/Adjunct)  Date:  12/08/2014  Time:  9:23 PM  Type of Therapy:  Psychoeducational Skills  Participation Level:  Active  Participation Quality:  Appropriate and Attentive  Affect:  Appropriate  Cognitive:  Appropriate  Insight:  Appropriate  Engagement in Group:  Engaged  Modes of Intervention:  Activity  Summary of Progress/Problems: Pts played a therapeutic activity of Mental Health Jeopardy. Pt was actively engaged and participated appropriately.  Clint Bolder 12/08/2014, 9:23 PM

## 2014-12-08 NOTE — BHH Group Notes (Signed)
[facility] LCSW Group Therapy 12/08/2014 1:15 PM  Type of Therapy: Group Therapy- Emotion Regulation  Participation Level: Active   Participation Quality:  Appropriate  Affect: Appropriate  Cognitive: Alert and Oriented   Insight:  Developing/Improving  Engagement in Therapy: Developing/Improving and Engaged   Modes of Intervention: Clarification, Confrontation, Discussion, Education, Exploration, Limit-setting, Orientation, Problem-solving, Rapport Building, Art therapist, Socialization and Support  Summary of Progress/Problems: The topic for group today was emotional regulation. This group focused on both positive and negative emotion identification and allowed group members to process ways to identify feelings, regulate negative emotions, and find healthy ways to manage internal/external emotions. Group members were asked to reflect on a time when their reaction to an emotion led to a negative outcome and explored how alternative responses using emotion regulation would have benefited them. Group members were also asked to discuss a time when emotion regulation was utilized when a negative emotion was experienced. Pt participated minimally in group discussion, was observed to be sleeping throughout group session after the beginning of the session. Pt identified humiliation and sadness as emotions that are difficult to regulate.   Peri Maris, [place] 12/08/2014 3:19 PM

## 2014-12-08 NOTE — Progress Notes (Signed)
D. Pt had been up and visible in milieu, did attend and participate in various activities. Pt appears pleasant this evening, states that he is feeing better, and denies any abdominal pain or discomfort this evening. Pt seen interacting appropriately with various staff and peers. Pt received all medications this evening and did not verbalize any complaints this evening. A. Support and encouragement provided. R. Safety maintained, will continue to monitor.

## 2014-12-08 NOTE — BHH Group Notes (Signed)
[facility] LCSW Aftercare Discharge Planning Group Note  12/08/2014 8:45 AM  Participation Quality: Alert, Appropriate and Oriented  Mood/Affect: Appropriate  Depression Rating: 0  Anxiety Rating: 0  Thoughts of Suicide: Pt denies SI/HI  Will you contract for safety? Yes  Current AVH: Pt denies  Plan for Discharge/Comments: Pt attended discharge planning group and actively participated in group. CSW discussed suicide prevention education with the group and encouraged them to discuss discharge planning and any relevant barriers. Pt was engaged in discussion and apologized for missing groups the day before as he had been sick. No immediate needs at this time.  Transportation Means: Pt reports access to transportation  Supports: No supports mentioned at this time  Peri Maris, [place] 12/08/2014 9:21 AM

## 2014-12-08 NOTE — Progress Notes (Signed)
Patient ID: [name], male   DOB: 04-07-1981, 34 y.o.   MRN: [id number] [facility] MD Progress Note  12/08/2014 6:00 PM [name]  MRN:  [id number] Subjective:   Patient reports improvement of mood , and states he feels much better today.  He had a time limited period of nausea, vomiting, abdominal cramping. These symptoms now improved and patient denies any vomiting today or any ongoing nausea. Has been able to eat his meals without abdominal distress or discomfort. Denies medication side effects. It is not felt that his symptoms as above represented medication side effect, rather, patient thinks it was some food intolerance .  Objective : I have discussed case with treatment team and have met with patient. As noted, patient much improved compared to his presentation yesterday- he presents better groomed, with fuller range of affect, and reporting resolution of symptoms. He is tolerating medications well.. As he improves , his group / milieu attendance has improved as well.  Patient has history of substance abuse/dependence- states he remains very motivated in recovery and relapse prevention.  Behavior on unit in good control.  Electrolytes within normal, no hyponatremia, no hypokalemia. CBC remarkable for leukocytosis.   Principal Problem: Major depressive disorder, recurrent episode, severe Diagnosis:   Patient Active Problem List   Diagnosis Date Noted  . Anxiety disorder [F41.9] 12/05/2014  . Major depressive disorder, recurrent episode, severe [F33.2] 12/05/2014  . 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 Opiate dependence   . Hepatitis C   . Anxiety   . Depression   . Kidney stone     Past Surgical History  Procedure Laterality Date  . Appendectomy     Family History:  Family History  Problem Relation Age of Onset  . Kidney cancer Father     renal cell carcenoma   . Hodgkin's lymphoma Mother   . Colon cancer Paternal Grandfather     great  . Lung cancer Paternal Grandmother    Social History:  History  Alcohol Use  . Yes    Comment: 1 per day     History  Drug Use No    Comment: former user    Social History   Social History  . Marital Status: Single    Spouse Name: N/A  . Number of Children: 0  . Years of Education: N/A   Occupational History  . Dealer    Social History Main Topics  . Smoking status: Former Smoker -- 0.50 packs/day for 9 years    Types: Cigarettes    Quit date: 04/16/2008  . Smokeless tobacco: Former Systems developer    Types: Chew    Quit date: 04/16/2008  . Alcohol Use: Yes     Comment: 1 per day  . Drug Use: No     Comment: former user  . Sexual Activity: Not Asked   Other Topics Concern  . None   Social History Narrative   Additional History:    Sleep: improved   Appetite: improved   Assessment:   Musculoskeletal: Strength & Muscle Tone: within normal limits Gait & Station: normal Patient leans: N/A   Psychiatric Specialty Exam: Physical Exam  ROS  No longer endorsing nausea or vomiting, no hematemesis, no diarrhea, no fever, no chills   Blood pressure 126/87, pulse 75, temperature 97.5 F (36.4 C), temperature source Oral, resp. rate 18, height 5' 8"  (1.727 m), weight 160  lb (72.576 kg).Body mass index is 24.33 kg/(m^2).  General Appearance: Well Groomed  Engineer, water::  Good  Speech:  Normal Rate  Volume:  Normal  Mood:  improved , today euthymic   Affect:   Improved, more reactive   Thought Process:  Linear  Orientation:  Other:  fully alert and attentive   Thought Content:  denies hallucinations and does not appear internally preoccupied   Suicidal Thoughts:  No- at this time denies any self injurious or suicidal thoughts   Homicidal Thoughts:  No  Memory:  recent and remote grossly intact   Judgement:  Fair  Insight:  Present  Psychomotor Activity:  Normal  Concentration:  Good   Recall:  Good  Fund of Knowledge:Good  Language: Good  Akathisia:  Negative  Handed:  Right  AIMS (if indicated):     Assets:  Communication Skills Desire for Improvement Housing Social Support Vocational/Educational  ADL's:   Fair   Cognition: WNL  Sleep:  Number of Hours: 5     Current Medications: Current Facility-Administered Medications  Medication Dose Route Frequency Provider Last Rate Last Dose  . acetaminophen (TYLENOL) tablet 650 mg  650 mg Oral Q6H PRN Harriet Butte, NP   650 mg at 12/08/14 0318  . alum & mag hydroxide-simeth (MAALOX/MYLANTA) 200-200-20 MG/5ML suspension 30 mL  30 mL Oral Q4H PRN Harriet Butte, NP      . amphetamine-dextroamphetamine (ADDERALL XR) 24 hr capsule 30 mg  30 mg Oral Daily Delfin Gant, NP   30 mg at 12/08/14 0912  . citalopram (CELEXA) tablet 40 mg  40 mg Oral Daily Delfin Gant, NP   40 mg at 12/08/14 0912  . dicyclomine (BENTYL) tablet 20 mg  20 mg Oral Q8H PRN Niel Hummer, NP   20 mg at 12/07/14 1132  . LORazepam (ATIVAN) tablet 1 mg  1 mg Oral BID PRN Jenne Campus, MD   1 mg at 12/08/14 1313  . magnesium hydroxide (MILK OF MAGNESIA) suspension 30 mL  30 mL Oral Daily PRN Harriet Butte, NP      . traZODone (DESYREL) tablet 50 mg  50 mg Oral QHS PRN Harriet Butte, NP   50 mg at 12/06/14 2354    Lab Results:  Results for orders placed or performed during the hospital encounter of 12/05/14 (from the past 48 hour(s))  CBC with Differential/Platelet     Status: Abnormal   Collection Time: 12/07/14  7:50 PM  Result Value Ref Range   WBC 16.1 (H) 4.0 - 10.5 K/uL   RBC 5.06 4.22 - 5.81 MIL/uL   Hemoglobin 15.8 13.0 - 17.0 g/dL   HCT 44.1 39.0 - 52.0 %   MCV 87.2 78.0 - 100.0 fL   MCH 31.2 26.0 - 34.0 pg   MCHC 35.8 30.0 - 36.0 g/dL   RDW 12.3 11.5 - 15.5 %   Platelets 379 150 - 400 K/uL   Neutrophils Relative % 74 43 - 77 %   Neutro Abs 11.9 (H) 1.7 - 7.7 K/uL   Lymphocytes Relative 18 12 - 46 %   Lymphs  Abs 2.9 0.7 - 4.0 K/uL   Monocytes Relative 8 3 - 12 %   Monocytes Absolute 1.2 (H) 0.1 - 1.0 K/uL   Eosinophils Relative 0 0 - 5 %   Eosinophils Absolute 0.1 0.0 - 0.7 K/uL   Basophils Relative 0 0 - 1 %   Basophils Absolute 0.0 0.0 - 0.1 K/uL  Comment: Performed at [facility] metabolic panel     Status: None   Collection Time: 12/07/14  7:50 PM  Result Value Ref Range   Sodium 139 135 - 145 mmol/L   Potassium 3.9 3.5 - 5.1 mmol/L   Chloride 102 101 - 111 mmol/L   CO2 29 22 - 32 mmol/L   Glucose, Bld 90 65 - 99 mg/dL   BUN 13 6 - 20 mg/dL   Creatinine, Ser 0.86 0.61 - 1.24 mg/dL   Calcium 9.6 8.9 - 10.3 mg/dL   GFR calc non Af Amer >60 >60 mL/min   GFR calc Af Amer >60 >60 mL/min    Comment: (NOTE) The eGFR has been calculated using the CKD EPI equation. This calculation has not been validated in all clinical situations. eGFR's persistently <60 mL/min signify possible Chronic Kidney Disease.    Anion gap 8 5 - 15    Comment: Performed at [facility]    Physical Findings: AIMS:  , ,  ,  ,    CIWA:    COWS:      Assessment -  Patient now improved after a time limited episode of nausea, vomiting, abdominal cramping . Has been able to have his meals without difficulties . Tolerating medications well and is currently presenting with much improved mood, improved affect, decreased anxiety.   Treatment Plan Summary: Daily contact with patient to assess and evaluate symptoms and progress in treatment, Medication management, Plan psychiatric inpatient management  and medications as below Continue Trazodone 50 mgrs QHS PRN for insomnia  Continue Ativan 1 mgr BID PRN for Anxiety as needed Continue Celexa 40 mgrs QDAY for management of anxiety and depression Continue Adderall XR 30 mgrs QDAY for management of Narcolepsy Repeat CBC in AM. Consider discharge soon as he continues to improve   Medical Decision Making:  Established  Problem, Stable/Improving (1), New problem, with additional work up planned, Review of Psycho-Social Stressors (1), Review or order clinical lab tests (1) and Review of Medication Regimen & Side Effects (2)     Aveyah Greenwood 12/08/2014, 6:00 PM

## 2014-12-08 NOTE — Progress Notes (Signed)
D- Patient is out in milieu interacting with peers and attending groups.  He has rated hopelessness,depression, and anxiety at 0 and denies physical pain.  He did request anti anxiety medication this afternoon which he rated as effective.  States he has developed his d/c plan.  A- Continue POC and evaluation of treatment goals.  Support and encouragement.  Cont 15' checks for safety.  R- Safety maintained.

## 2014-12-09 DIAGNOSIS — F332 Major depressive disorder, recurrent severe without psychotic features: Secondary | ICD-10-CM | POA: Insufficient documentation

## 2014-12-09 MED ORDER — TRAZODONE HCL 50 MG PO TABS: 50.0000 mg | ORAL_TABLET | Freq: Every evening | ORAL | Status: DC | PRN

## 2014-12-09 MED ORDER — DICYCLOMINE HCL 20 MG PO TABS: 20.0000 mg | ORAL_TABLET | Freq: Three times a day (TID) | ORAL | Status: DC | PRN

## 2014-12-09 MED ORDER — CITALOPRAM HYDROBROMIDE 40 MG PO TABS: 40.0000 mg | ORAL_TABLET | Freq: Every day | ORAL | Status: DC

## 2014-12-09 NOTE — [facility] Suicide Risk Assessment (Addendum)
[facility] Discharge Suicide Risk Assessment   Demographic Factors:  34 year old , lives with fiance, has one daughter- 19 months old, employed   Total Time spent with patient: 30 minutes  Musculoskeletal: Strength & Muscle Tone: within normal limits Gait & Station: normal Patient leans: N/A  Psychiatric Specialty Exam: Physical Exam  ROS no fever, no chills, no myalgias, GI symptoms   ( nausea, vomiting) improved. No diarrhea, no  rash.  Blood pressure 138/93, pulse 105, temperature 97.5 F (36.4 C), temperature source Oral, resp. rate 20, height 5\' 8"  (1.727 m), weight 160 lb (72.576 kg).Body mass index is 24.33 kg/(m^2).  General Appearance: Well Groomed  Engineer, water::  Good  Speech:  Normal Rate409  Volume:  Normal  Mood:  euthymic  Affect:  Appropriate and Full Range  Thought Process:  Linear  Orientation:  Full (Time, Place, and Person)  Thought Content:  no hallucinations, no delusions, not internally preoccupied   Suicidal Thoughts:  No  Homicidal Thoughts:  No  Memory:  recent and remote grossly intact   Judgement:  Good  Insight:  Present  Psychomotor Activity:  Normal  Concentration:  Good  Recall:  Good  Fund of Knowledge:Good  Language: Good  Akathisia:  Negative  Handed:  Right  AIMS (if indicated):     Assets:  Communication Skills Desire for Improvement Resilience Social Support Vocational/Educational  Sleep:  Number of Hours: 4.5  Cognition: WNL  ADL's: improved    Have you used any form of tobacco in the last 30 days? (Cigarettes, Smokeless Tobacco, Cigars, and/or Pipes): Patient Refused Screening  Has this patient used any form of tobacco in the last 30 days? (Cigarettes, Smokeless Tobacco, Cigars, and/or Pipes) No- patient states he does not smoke   Mental Status Per Nursing Assessment::   On Admission:  NA  Current Mental Status by Physician: At this time patient is much improved compared to his admission- today he is euthymic, with full range of  affect, no thought disorder, no psychotic symptoms, no suicidal or homicidal ideations, future oriented , looking forward to seeing fiance and child, and planning to return to work later this week.   Loss Factors: Financial constraints.   Historical Factors: History of opiate dependence , now sober,  History of depression in the past , a suicide attempt at age 8  By overdosing ,   Risk Reduction Factors:   Responsible for children under 71 years of age, Sense of responsibility to family, Employed, Living with another person, especially a relative and Positive coping skills or problem solving skills  Continued Clinical Symptoms:  At this time patient is improved compared to admission and presents euthymic, no SI, optimistic . Of note, patient developed episode of nausea and vomiting while in the hospital. This is now much improved, felt vaguely nauseous yesterday, no vomiting x 2 days now, and able to eat well without pain or discomfort .  Cognitive Features That Contribute To Risk:  No gross cognitive deficits noted upon discharge. Is alert , attentive, and oriented x 3   Suicide Risk:  Mild:  Suicidal ideation of limited frequency, intensity, duration, and specificity.  There are no identifiable plans, no associated intent, mild dysphoria and related symptoms, good self-control (both objective and subjective assessment), few other risk factors, and identifiable protective factors, including available and accessible social support.  Principal Problem: Major depressive disorder, recurrent episode, severe Discharge Diagnoses:  Patient Active Problem List   Diagnosis Date Noted  . Anxiety disorder [  F41.9] 12/05/2014  . Major depressive disorder, recurrent episode, severe [F33.2] 12/05/2014  . 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   Why:  at 10:30 with Dr. Toy Care for medication management.   Contact  information:   [address] [place], [place] [address] PHONE [phone]         Follow up with [facility] On 12/28/2014.   Specialty:  Professional Counselor   Why:  at 4:00pm for therapy.   Contact information:   [facility] [place] [address] [phone]       Plan Of Care/Follow-up recommendations:  Activity:  as tolerated  Diet:  Regular Tests:  NA Other:  See below   Is patient on multiple antipsychotic therapies at discharge:  No   Has Patient had three or more failed trials of antipsychotic monotherapy by history:  No  Recommended Plan for Multiple Antipsychotic Therapies: NA   Patient is leaving unit in good spirits. Fiance will pick him up later today. Follow up as above. Patient plans to continue going to NA, encouraged him also to get a sponsor .  Patient has a PCP , Dr. Tinnie Gens, , with whom he plans to follow up for medical management as needed. Patient aware that his CBC demonstrates leukocytosis. As noted , he has no fever, chills and states he does not feel ill.     Nareh Matzke, [place] 12/09/2014, 10:02 AM

## 2014-12-09 NOTE — Progress Notes (Signed)
Patient' WBC 16.1 on 8/23.  Repeat lab work was not collected before patient was discharged.  Dr Parke Poisson aware.

## 2014-12-09 NOTE — Progress Notes (Signed)
Patient ID: [name], male   DOB: 04-01-1981, 34 y.o.   MRN: [id number]  Pt. Denies SI/HI and A/V hallucinations. Belongings returned to patient at time of discharge. Patient denies any pain or discomfort. Discharge instructions and medications were reviewed with patient. Patient verbalized understanding of both medications and discharge instructions. Patient discharged to lobby where family member was going to pick him up. Q15 minute safety checks maintained until discharge.

## 2014-12-09 NOTE — Tx Team (Signed)
Interdisciplinary Treatment Plan Update (Adult) Date: 12/09/2014   Date: 12/09/2014 2:13 PM  Progress in Treatment:  Attending groups: Yes Participating in groups: Yes Taking medication as prescribed: Yes  Tolerating medication: Yes  Family/Significant othe contact made: No, Pt declines Patient understands diagnosis: Yes Discussing patient identified problems/goals with staff: Yes  Medical problems stabilized or resolved: Yes  Denies suicidal/homicidal ideation: Yes Patient has not harmed self or Others: Yes   New problem(s) identified: None identified at this time.   Discharge Plan or Barriers: Pt will return home and follow up with Dr. Toy Care and [facility] [employer].  Additional comments: n/a   Reason for Continuation of Hospitalization:  Anxiety Depression Medication stabilization Suicidal ideation  Estimated length of stay: 0 days; stable for DC today  Review of initial/current patient goals per problem list:   1.  Goal(s): Patient will participate in aftercare plan  Met:  Yes  Target date: 3-5 days from date of admission   As evidenced by: Patient will participate within aftercare plan AEB aftercare provider and housing plan at discharge being identified.   12/07/14: pt will return home and follow up with outpatient providers.  2.  Goal (s): Patient will exhibit decreased depressive symptoms and suicidal ideations.  Met:  Yes  Target date: 3-5 days from date of admission   As evidenced by: Patient will utilize self rating of depression at 3 or below and demonstrate decreased signs of depression or be deemed stable for discharge by MD.  12/07/14: Pt rates depression at 5/10 but reports much improved mood and affect  12/09/14: Pt rates depression at 0/10. Denies SI.  3.  Goal(s): Patient will demonstrate decreased signs and symptoms of anxiety.  Met:  Yes  Target date: 3-5 days from date of admission   As evidenced by: Patient will utilize self  rating of anxiety at 3 or below and demonstrated decreased signs of anxiety, or be deemed stable for discharge by MD  12/07/14: pt rates anxiety at 4/10. Continues to present with less anxious affect.  12/09/14: Pt rates anxiety at 0/10.    Attendees:  Patient:    Family:    Physician: Dr. Parke Poisson, MD  12/09/2014 2:13 PM  Nursing: Lars Pinks, RN Case manager  12/09/2014 2:13 PM  Clinical Social Worker Norman Clay, MSW 12/09/2014 2:13 PM  Other: Lucinda Dell, Beverly Sessions Liasion 12/09/2014 2:13 PM  Clinical:  Gaylan Gerold, RN 12/09/2014 2:13 PM  Other: , RN Charge Nurse 12/09/2014 2:13 PM  Other:     Peri Maris, [place] MSW

## 2014-12-09 NOTE — Progress Notes (Signed)
  [facility] Adult Case Management Discharge Plan :  Will you be returning to the same living situation after discharge:  Yes,  home with family At discharge, do you have transportation home?: Yes,  girlfriend to provide transportation Do you have the ability to pay for your medications: Yes,  Pt provided with samples and prescriptions  Release of information consent forms completed and in the chart;  Patient's signature needed at discharge.  Patient to Follow up at: Follow-up Information    Follow up with [facility] On 12/30/2014.   Why:  at 10:30 with Dr. Toy Care for medication management.   Contact information:   [address] [place], [place] [address] PHONE [phone]         Follow up with [facility] On 12/28/2014.   Specialty:  Professional Counselor   Why:  at 4:00pm for therapy.   Contact information:   [facility] [place] [address] [phone]       Patient denies SI/HI: Yes,  Pt denies    Safety Planning and Suicide Prevention discussed: Yes,  with Pt; declined family contact  Have you used any form of tobacco in the last 30 days? (Cigarettes, Smokeless Tobacco, Cigars, and/or Pipes): Patient Refused Screening  Has patient been referred to the Quitline?: Patient refused referral  Bo Mcclintock 12/09/2014, 2:15 PM

## 2014-12-09 NOTE — Progress Notes (Signed)
Recreation Therapy Notes  Date: 08.24.2016 Time: 9:30am Location: [address] Group Room   Group Topic: Stress Management  Goal Area(s) Addresses:  Patient will actively participate in stress management techniques presented during session.   Behavioral Response: Engaged, Appropriate   Intervention: Stress management techniques  Activity :  Deep Breathing and Progressive Muscle Relaxation. LRT provided instruction and demonstration on practice of Progressive Muscle Relaxation. Technique was coupled with deep breathing.   Education:  Stress Management, Discharge Planning.   Education Outcome: Acknowledges edcuation  Clinical Observations/Feedback: Patient actively engaged in technique introduced during session, expressing no difficulty and demonstrating ability to practice independently post d/c.    [name] Ramaya Guile, LRT/CTRS  Cairo Agostinelli L 12/09/2014 9:13 AM

## 2014-12-09 NOTE — Discharge Summary (Signed)
Physician Discharge Summary Note  Patient:  [name] is an 34 y.o., male MRN:  [id number] DOB:  09-May-1980 Patient phone:  [phone] (home)  Patient address:   [address] [place] [address],  Total Time spent with patient: 45 minutes  Date of Admission:  12/05/2014 Date of Discharge: 12/09/2014  Reason for Admission:  depression  Principal Problem: Major depressive disorder, recurrent episode, severe Discharge Diagnoses: Patient Active Problem List   Diagnosis Date Noted  . Major depressive disorder, recurrent, severe without psychotic features [F33.2]   . Anxiety disorder [F41.9] 12/05/2014  . Major depressive disorder, recurrent episode, severe [F33.2] 12/05/2014  . 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   Review of Systems  All other systems reviewed and are negative.   Blood pressure 138/93, pulse 105, temperature 97.5 F (36.4 C), temperature source Oral, resp. rate 20, height 5' 8"  (1.727 m), weight 72.576 kg (160 lb).Body mass index is 24.33 kg/(m^2).   General Appearance: Well Groomed  Engineer, water:: Good  Speech: Normal Rate409  Volume: Normal  Mood: euthymic  Affect: Appropriate and Full Range  Thought Process: Linear  Orientation: Full (Time, Place, and Person)  Thought Content: no hallucinations, no delusions, not internally preoccupied   Suicidal Thoughts: No  Homicidal Thoughts: No  Memory: recent and remote grossly intact   Judgement: Good  Insight: Present  Psychomotor Activity: Normal  Concentration: Good  Recall: Good  Fund of Knowledge:Good  Language: Good  Akathisia: Negative  Handed: Right  AIMS (if indicated):    Assets: Communication Skills Desire for Improvement Resilience Social Support Vocational/Educational   Sleep: Number of Hours: 4.5  Cognition: WNL  ADL's: improved        Have you used any form of tobacco in the last 30 days? (Cigarettes, Smokeless Tobacco, Cigars, and/or Pipes): Patient Refused Screening  Has this patient used any form of tobacco in the last 30 days? (Cigarettes, Smokeless Tobacco, Cigars, and/or Pipes) N/A  Past Medical History:  Past Medical History  Diagnosis Date  . Opiate dependence   . Hepatitis C   . Anxiety   . Depression   . Kidney stone     Past Surgical History  Procedure Laterality Date  . Appendectomy     Family History:  Family History  Problem Relation Age of Onset  . Kidney cancer Father     renal cell carcenoma  . Hodgkin's lymphoma Mother   . Colon cancer Paternal Grandfather     great  . Lung cancer Paternal Grandmother    Social History:  History  Alcohol Use  . Yes    Comment: 1 per day     History  Drug Use No    Comment: former user    Social History   Social History  . Marital Status: Single    Spouse Name: N/A  . Number of Children: 0  . Years of Education: N/A   Occupational History  . Dealer    Social History Main Topics  . Smoking status: Former Smoker -- 0.50 packs/day for 9 years    Types: Cigarettes    Quit date: 04/16/2008  . Smokeless tobacco: Former Systems developer    Types: Chew    Quit date: 04/16/2008  . Alcohol Use: Yes     Comment: 1 per day  . Drug Use: No     Comment: former  user  . Sexual Activity: Not Asked   Other Topics Concern  . None   Social History Narrative   Risk to Self: Is patient at risk for suicide?: Yes What has been your use of drugs/alcohol within the last 12 months?: started smoking THC 6 months ago (a couple of hits nightly) Risk to Others:   Prior Inpatient Therapy:   Prior Outpatient Therapy:    Level of Care:  OP  Hospital Course:   [name], 34 year old male stated that he had been undergoing a lot of stressors to include work and financial difficulties.   He has a hx of opiate  [name] was admitted for Major depressive disorder, recurrent episode, severe and crisis management.  He was treated discharged with the medications listed below under Medication List.  Medical problems were identified and treated as needed.  Home medications were restarted as appropriate.  Improvement was monitored by observation and [name] daily report of symptom reduction.  Emotional and mental status was monitored by daily self-inventory reports completed by [name] and clinical staff.         [name] was evaluated by the treatment team for stability and plans for continued recovery upon discharge.  [name] motivation was an integral factor for scheduling further treatment.  Employment, transportation, bed availability, health status, family support, and any pending legal issues were also considered during his hospital stay.  He was offered further treatment options upon discharge including but not limited to Residential, Intensive Outpatient, and Outpatient treatment.  [name] will follow up with the services as listed below under Follow Up Information.     Upon completion of this admission the patient was both mentally and medically stable for discharge denying suicidal/homicidal ideation, auditory/visual/tactile hallucinations, delusional thoughts and paranoia.      Consults:  psychiatry  Significant Diagnostic Studies:  labs: per ED  Discharge Vitals:   Blood pressure 138/93, pulse 105, temperature 97.5 F (36.4 C), temperature source Oral, resp. rate 20, height 5' 8"  (1.727 m), weight 72.576 kg (160 lb). Body mass index is 24.33 kg/(m^2). Lab Results:   Results for orders placed or performed during the hospital encounter of 12/05/14 (from the past 72 hour(s))  CBC with Differential/Platelet     Status: Abnormal   Collection Time: 12/07/14  7:50 PM  Result Value Ref Range   WBC 16.1 (H) 4.0 - 10.5 K/uL   RBC 5.06  4.22 - 5.81 MIL/uL   Hemoglobin 15.8 13.0 - 17.0 g/dL   HCT 44.1 39.0 - 52.0 %   MCV 87.2 78.0 - 100.0 fL   MCH 31.2 26.0 - 34.0 pg   MCHC 35.8 30.0 - 36.0 g/dL   RDW 12.3 11.5 - 15.5 %   Platelets 379 150 - 400 K/uL   Neutrophils Relative % 74 43 - 77 %   Neutro Abs 11.9 (H) 1.7 - 7.7 K/uL   Lymphocytes Relative 18 12 - 46 %   Lymphs Abs 2.9 0.7 - 4.0 K/uL   Monocytes Relative 8 3 - 12 %   Monocytes Absolute 1.2 (H) 0.1 - 1.0 K/uL   Eosinophils Relative 0 0 - 5 %   Eosinophils Absolute 0.1 0.0 - 0.7 K/uL   Basophils Relative 0 0 - 1 %   Basophils Absolute 0.0 0.0 - 0.1 K/uL    Comment: Performed at [facility] metabolic panel     Status: None   Collection Time: 12/07/14  7:50  PM  Result Value Ref Range   Sodium 139 135 - 145 mmol/L   Potassium 3.9 3.5 - 5.1 mmol/L   Chloride 102 101 - 111 mmol/L   CO2 29 22 - 32 mmol/L   Glucose, Bld 90 65 - 99 mg/dL   BUN 13 6 - 20 mg/dL   Creatinine, Ser 0.86 0.61 - 1.24 mg/dL   Calcium 9.6 8.9 - 10.3 mg/dL   GFR calc non Af Amer >60 >60 mL/min   GFR calc Af Amer >60 >60 mL/min    Comment: (NOTE) The eGFR has been calculated using the CKD EPI equation. This calculation has not been validated in all clinical situations. eGFR's persistently <60 mL/min signify possible Chronic Kidney Disease.    Anion gap 8 5 - 15    Comment: Performed at [facility]    Physical Findings: AIMS:  , ,  ,  ,    CIWA:    COWS:      See Psychiatric Specialty Exam and Suicide Risk Assessment completed by Attending Physician prior to discharge.  Discharge destination:  Home  Is patient on multiple antipsychotic therapies at discharge:  No   Has Patient had three or more failed trials of antipsychotic monotherapy by history:  No    Recommended Plan for Multiple Antipsychotic Therapies: NA     Medication List    STOP taking these medications        amphetamine-dextroamphetamine 30 MG tablet   Commonly known as:  ADDERALL     buPROPion 150 MG 24 hr tablet  Commonly known as:  WELLBUTRIN XL     LORazepam 1 MG tablet  Commonly known as:  ATIVAN     multivitamin with minerals Tabs tablet     ZUBSOLV 1.4-0.36 MG Subl  Generic drug:  Buprenorphine HCl-Naloxone HCl      TAKE these medications      Indication   citalopram 40 MG tablet  Commonly known as:  CELEXA  Take 1 tablet (40 mg total) by mouth daily.   Indication:  Depression     dicyclomine 20 MG tablet  Commonly known as:  BENTYL  Take 1 tablet (20 mg total) by mouth every 8 (eight) hours as needed for spasms.   Indication:  Acute Small and Large Intestine Inflammation     traZODone 50 MG tablet  Commonly known as:  DESYREL  Take 1 tablet (50 mg total) by mouth at bedtime as needed (May repeat x1).   Indication:  Trouble Sleeping           Follow-up Information    Follow up with [facility] On 12/30/2014.   Why:  at 10:30 with Dr. Toy Care for medication management.   Contact information:   [address] [place], [place] [address] PHONE [phone]         Follow up with [place] On 12/28/2014.   Specialty:  Professional Counselor   Why:  at 4:00pm for therapy.   Contact information:   [facility] [place] [address] [phone]       Follow-up recommendations:  Activity:  as tol Diet:  as tol  Comments:  1.  Take all your medications as prescribed.              2.  Report any adverse side effects to outpatient provider.  3.  Patient instructed to not use alcohol or illegal drugs while on prescription medicines.            4.  In the event of worsening symptoms, instructed patient to call 911, the crisis hotline or go to nearest emergency room for evaluation of symptoms.  Total Discharge Time:  30 min  Signed: Freda Munro May Agustin 12/09/2014, 2:42 PM  Patient seen, Suicide  Assessment Completed.  Disposition Plan Reviewed

## 2014-12-09 NOTE — Progress Notes (Signed)
Pt has been up for the past couple hours, has begun to complain about nausea and vomiting again, however he is not at this time complaining of any abdominal pain. Pt requested some ginger ale and went back to room.

## 2015-02-03 ENCOUNTER — Encounter (HOSPITAL_COMMUNITY): Payer: Self-pay | Admitting: Neurology

## 2015-02-03 ENCOUNTER — Emergency Department (HOSPITAL_COMMUNITY)
Admission: EM | Admit: 2015-02-03 | Discharge: 2015-02-03 | Disposition: A | Discharge status: Home / Self Care | Payer: PRIVATE HEALTH INSURANCE | Attending: Physician Assistant | Admitting: Physician Assistant

## 2015-02-03 DIAGNOSIS — R000 Tachycardia, unspecified: Secondary | ICD-10-CM | POA: Insufficient documentation

## 2015-02-03 DIAGNOSIS — Z8619 Personal history of other infectious and parasitic diseases: Secondary | ICD-10-CM | POA: Insufficient documentation

## 2015-02-03 DIAGNOSIS — F329 Major depressive disorder, single episode, unspecified: Secondary | ICD-10-CM | POA: Insufficient documentation

## 2015-02-03 DIAGNOSIS — R197 Diarrhea, unspecified: Secondary | ICD-10-CM

## 2015-02-03 DIAGNOSIS — F419 Anxiety disorder, unspecified: Secondary | ICD-10-CM

## 2015-02-03 DIAGNOSIS — Z87891 Personal history of nicotine dependence: Secondary | ICD-10-CM | POA: Insufficient documentation

## 2015-02-03 DIAGNOSIS — K625 Hemorrhage of anus and rectum: Secondary | ICD-10-CM

## 2015-02-03 DIAGNOSIS — Z79899 Other long term (current) drug therapy: Secondary | ICD-10-CM | POA: Insufficient documentation

## 2015-02-03 DIAGNOSIS — Z87442 Personal history of urinary calculi: Secondary | ICD-10-CM | POA: Insufficient documentation

## 2015-02-03 LAB — I-STAT CHEM 8, ED
BUN: 18 mg/dL (ref 6–20)
CREATININE: 0.9 mg/dL (ref 0.61–1.24)
Calcium, Ion: 1.11 mmol/L — ABNORMAL LOW (ref 1.12–1.23)
Chloride: 102 mmol/L (ref 101–111)
GLUCOSE: 113 mg/dL — AB (ref 65–99)
HCT: 47 % (ref 39.0–52.0)
HEMOGLOBIN: 16 g/dL (ref 13.0–17.0)
POTASSIUM: 3.9 mmol/L (ref 3.5–5.1)
Sodium: 139 mmol/L (ref 135–145)
TCO2: 24 mmol/L (ref 0–100)

## 2015-02-03 LAB — POC OCCULT BLOOD, ED: Fecal Occult Bld: NEGATIVE

## 2015-02-03 MED ORDER — POLYETHYLENE GLYCOL 3350 17 GM/SCOOP PO POWD: 17.0000 g | Freq: Every day | ORAL | Status: DC
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
## 2015-02-03 NOTE — ED Notes (Signed)
Pt reports for several months has been either constipated or having diarrhea. Has had blood in stool, thinks he has a hemorrhoid and has mucus in his stool. Has back pain at night.

## 2015-02-03 NOTE — Discharge Instructions (Signed)
1. Medications: miralax when constipated, usual home medications 2. Treatment: rest, drink plenty of fluids, high fiber diet 3. Follow Up: Please followup with gastroenterology in 2-3 days for discussion of your diagnoses and further evaluation after today's visit; if you do not have a primary care doctor use the resource guide provided to find one; Please return to the ER for worsening symptoms, other concerns

## 2015-02-03 NOTE — ED Notes (Signed)
The patient is alert and oriented and vital signs are stable at discharge.  Patient is ambulatory with no apparent difficulty.  There is no acute distress noted and patient appears stable for discharge.  Discharge instructions, follow-up information, and return precautions have been discussed with the patient.  Teach back utilized, denies further questions.

## 2015-02-19 ENCOUNTER — Emergency Department (HOSPITAL_COMMUNITY)
Admission: EM | Admit: 2015-02-19 | Discharge: 2015-02-19 | Disposition: A | Discharge status: Home / Self Care | Payer: No Typology Code available for payment source | Attending: Emergency Medicine | Admitting: Emergency Medicine

## 2015-02-19 ENCOUNTER — Encounter (HOSPITAL_COMMUNITY): Payer: Self-pay | Admitting: Emergency Medicine

## 2015-02-19 ENCOUNTER — Emergency Department (HOSPITAL_COMMUNITY): Payer: No Typology Code available for payment source

## 2015-02-19 DIAGNOSIS — F419 Anxiety disorder, unspecified: Secondary | ICD-10-CM | POA: Insufficient documentation

## 2015-02-19 DIAGNOSIS — Y92410 Unspecified street and highway as the place of occurrence of the external cause: Secondary | ICD-10-CM | POA: Insufficient documentation

## 2015-02-19 DIAGNOSIS — R110 Nausea: Secondary | ICD-10-CM | POA: Insufficient documentation

## 2015-02-19 DIAGNOSIS — Z87891 Personal history of nicotine dependence: Secondary | ICD-10-CM | POA: Insufficient documentation

## 2015-02-19 DIAGNOSIS — S299XXA Unspecified injury of thorax, initial encounter: Secondary | ICD-10-CM | POA: Insufficient documentation

## 2015-02-19 DIAGNOSIS — Z79899 Other long term (current) drug therapy: Secondary | ICD-10-CM | POA: Insufficient documentation

## 2015-02-19 DIAGNOSIS — Y998 Other external cause status: Secondary | ICD-10-CM | POA: Diagnosis not present

## 2015-02-19 DIAGNOSIS — R0789 Other chest pain: Secondary | ICD-10-CM

## 2015-02-19 DIAGNOSIS — F329 Major depressive disorder, single episode, unspecified: Secondary | ICD-10-CM | POA: Insufficient documentation

## 2015-02-19 DIAGNOSIS — Z87442 Personal history of urinary calculi: Secondary | ICD-10-CM | POA: Insufficient documentation

## 2015-02-19 DIAGNOSIS — Z8619 Personal history of other infectious and parasitic diseases: Secondary | ICD-10-CM | POA: Diagnosis not present

## 2015-02-19 DIAGNOSIS — Y9389 Activity, other specified: Secondary | ICD-10-CM | POA: Diagnosis not present

## 2015-02-19 MED ORDER — IBUPROFEN 800 MG PO TABS: 800.0000 mg | ORAL_TABLET | Freq: Three times a day (TID) | ORAL | Status: DC

## 2015-02-19 MED ORDER — ONDANSETRON HCL 4 MG PO TABS
4.0000 mg | ORAL_TABLET | Freq: Once | ORAL | Status: AC
Start: 2015-02-19 — End: 2015-02-19
  Administered 2015-02-19: 4 mg via ORAL
  Filled 2015-02-19: qty 1

## 2015-02-19 MED ORDER — CYCLOBENZAPRINE HCL 10 MG PO TABS: 10.0000 mg | ORAL_TABLET | Freq: Two times a day (BID) | ORAL | Status: DC | PRN
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
## 2015-03-28 ENCOUNTER — Emergency Department (HOSPITAL_COMMUNITY)
Admission: EM | Admit: 2015-03-28 | Discharge: 2015-03-28 | Disposition: A | Discharge status: Home / Self Care | Payer: PRIVATE HEALTH INSURANCE

## 2015-03-28 NOTE — ED Notes (Signed)
Called for triage with no response x1 time

## 2015-03-28 NOTE — ED Notes (Signed)
Called at 14:05

## 2015-05-09 ENCOUNTER — Other Ambulatory Visit (HOSPITAL_COMMUNITY): Payer: Self-pay | Admitting: Otolaryngology

## 2015-05-09 DIAGNOSIS — C4442 Squamous cell carcinoma of skin of scalp and neck: Secondary | ICD-10-CM

## 2015-05-13 ENCOUNTER — Other Ambulatory Visit: Payer: Self-pay | Admitting: Otolaryngology

## 2015-05-13 DIAGNOSIS — C4442 Squamous cell carcinoma of skin of scalp and neck: Secondary | ICD-10-CM

## 2015-05-18 ENCOUNTER — Ambulatory Visit
Admission: RE | Admit: 2015-05-18 | Discharge: 2015-05-18 | Disposition: A | Discharge status: Home / Self Care | Payer: BLUE CROSS/BLUE SHIELD | Source: Ambulatory Visit | Attending: Otolaryngology | Admitting: Otolaryngology

## 2015-05-18 ENCOUNTER — Ambulatory Visit (HOSPITAL_COMMUNITY): Payer: No Typology Code available for payment source

## 2015-05-18 DIAGNOSIS — C4442 Squamous cell carcinoma of skin of scalp and neck: Secondary | ICD-10-CM

## 2015-05-18 MED ORDER — IOPAMIDOL (ISOVUE-300) INJECTION 61%
75.0000 mL | Freq: Once | INTRAVENOUS | Status: AC | PRN
  Administered 2015-05-18: 75 mL via INTRAVENOUS

## 2015-05-19 ENCOUNTER — Ambulatory Visit (INDEPENDENT_AMBULATORY_CARE_PROVIDER_SITE_OTHER): Payer: BLUE CROSS/BLUE SHIELD | Admitting: Family

## 2015-05-19 ENCOUNTER — Encounter: Payer: Self-pay | Admitting: Family

## 2015-05-19 VITALS — BP 116/84 | HR 81 | Temp 97.5°F | Resp 18 | Ht 67.0 in | Wt 174.0 lb

## 2015-05-19 DIAGNOSIS — Z23 Encounter for immunization: Secondary | ICD-10-CM | POA: Diagnosis not present

## 2015-05-19 DIAGNOSIS — C801 Malignant (primary) neoplasm, unspecified: Secondary | ICD-10-CM

## 2015-05-19 DIAGNOSIS — F411 Generalized anxiety disorder: Secondary | ICD-10-CM | POA: Diagnosis not present

## 2015-05-19 DIAGNOSIS — F332 Major depressive disorder, recurrent severe without psychotic features: Secondary | ICD-10-CM

## 2015-05-19 DIAGNOSIS — L299 Pruritus, unspecified: Secondary | ICD-10-CM | POA: Insufficient documentation

## 2015-05-19 DIAGNOSIS — IMO0002 Reserved for concepts with insufficient information to code with codable children: Secondary | ICD-10-CM | POA: Insufficient documentation

## 2015-05-19 MED ORDER — HYDROXYZINE HCL 25 MG PO TABS: 25.0000 mg | ORAL_TABLET | Freq: Three times a day (TID) | ORAL | Status: DC | PRN

## 2015-05-19 NOTE — Progress Notes (Signed)
Subjective:    Patient ID: [name], male    DOB: [birth year], 35 y.o.   MRN: [id number]  Chief Complaint  Patient presents with  . Establish Care    just found out he has cancer of his lymph nodes     HPI:  [name] is a 34 y.o. male who  has a past medical history of Opiate dependence ([place]); Hepatitis C; Anxiety; Depression; and Kidney stone. and presents today for an office visit to establish care.  1.) Neck - Recently seen in the ED at [facility] for for swelling of his neck and instructed to follow up with primary care.  Associated symptom of swelling located on the left side of his neck has been going on for about 5 months. Modifying factors include a couple of courses of antibiotics which did not help much. Followed up with an ENT and was recently diagnosed with squamous cell carcinoma. He completed a CT scan which was consistent with potential squamous cell carcinoma and is scheduled to complete a PET scan in the next few days. He is currently being managed by Dr. Janace Hoard of ENT.   2.) Anxiety / Depression -  Currently maintained on citalopram and lorazepam. Takes the medication as prescribed and denies adverse side effects. Reports that his symptoms are well controlled with his current medication. Denies suicidal ideation. Takes the lorazepam as needed. Currently managed by Dr. Harlene Ramus.  Allergies  Allergen Reactions  . Ceclor [Cefaclor] Other (See Comments)    Does not recall, was a small child      Outpatient Prescriptions Prior to Visit  Medication Sig Dispense Refill  . citalopram (CELEXA) 40 MG tablet Take 1 tablet (40 mg total) by mouth daily. 30 tablet 0  . cyclobenzaprine (FLEXERIL) 10 MG tablet Take 1 tablet (10 mg total) by mouth 2 (two) times daily as needed for muscle spasms. 20 tablet 0  . dicyclomine (BENTYL) 20 MG tablet Take 1 tablet (20 mg total) by mouth every 8 (eight) hours as needed for spasms. 30 tablet 0  . ibuprofen (ADVIL,MOTRIN) 800 MG  tablet Take 1 tablet (800 mg total) by mouth 3 (three) times daily. 21 tablet 0  . polyethylene glycol powder (GLYCOLAX/MIRALAX) powder Take 17 g by mouth daily. 255 g 0  . traZODone (DESYREL) 50 MG tablet Take 1 tablet (50 mg total) by mouth at bedtime as needed (May repeat x1). 30 tablet 0   No facility-administered medications prior to visit.     Past Medical History  Diagnosis Date  . Opiate dependence ([place])   . Hepatitis C   . Anxiety   . Depression   . Kidney stone      Past Surgical History  Procedure Laterality Date  . Appendectomy       Family History  Problem Relation Age of Onset  . Kidney cancer Father     renal cell carcenoma  . Hodgkin's lymphoma Mother   . Colon cancer Paternal Grandfather     great  . Lung cancer Paternal Grandmother   . Diabetes Maternal Grandmother   . Pancreatic cancer Maternal Grandmother   . Melanoma Maternal Grandfather      Social History   Social History  . Marital Status: Single    Spouse Name: N/A  . Number of Children: 1  . Years of Education: 14   Occupational History  . Mechanic    Social History Main Topics  . Smoking status: Former Smoker -- 0.50 packs/day for 9  years    Types: Cigarettes    Quit date: 04/16/2008  . Smokeless tobacco: Former Systems developer    Types: Chew    Quit date: 04/16/2008  . Alcohol Use: No     Comment: 1 per day  . Drug Use: No     Comment: former user  . Sexual Activity: Not on file   Other Topics Concern  . Not on file   Social History Narrative   Fun: Play guitar and hang out with his daughter    Review of Systems  Constitutional: Positive for unexpected weight change. Negative for fever and chills.  HENT: Positive for ear pain. Negative for trouble swallowing.   Respiratory: Positive for shortness of breath. Negative for chest tightness.   Cardiovascular: Negative for chest pain, palpitations and leg swelling.      Objective:    BP 116/84 mmHg  Pulse 81  Temp(Src) 97.5 F  (36.4 C) (Oral)  Resp 18  Ht 5\' 7"  (1.702 m)  Wt 174 lb (78.926 kg)  BMI 27.25 kg/m2  SpO2 99% Nursing note and vital signs reviewed.  Physical Exam  Constitutional: He is oriented to person, place, and time. He appears well-developed and well-nourished. No distress.  Cardiovascular: Normal rate, regular rhythm, normal heart sounds and intact distal pulses.   Pulmonary/Chest: Effort normal and breath sounds normal.  Lymphadenopathy:       Head (left side): Submandibular (Approximately 2 cm firm, non-mobile nodule. ) adenopathy present.  Neurological: He is alert and oriented to person, place, and time.  Skin: Skin is warm and dry.  Psychiatric: He has a normal mood and affect. His behavior is normal. Judgment and thought content normal.       Assessment & Plan:   Problem List Items Addressed This Visit      Other   Anxiety disorder    Stable with current dosage of Celexa and denies having to use lorazepam often. Denies adverse side effects. Continue current dosage of Celexa and lorazepam. Changes per psychiatry.       Major depressive disorder, recurrent episode, severe ([place])    Stable with current dosage of Celexa and reports that his mood is improved. Denies suicidal ideation. Continue current dosage of Celexa with changes per psychiatry.       Relevant Medications   LORazepam (ATIVAN) 1 MG tablet   hydrOXYzine (ATARAX/VISTARIL) 25 MG tablet   Squamous cell carcinoma ([facility]) - Primary    CT scan consistent with squamous cell carcinoma and is scheduled for a PET scan. Physical exam with no evidence of shortness of breath or difficulty swallowing.  Continue conservative management and follow up with Dr. Janace Hoard of ENT for further management.       Relevant Medications   LORazepam (ATIVAN) 1 MG tablet   Itching    Generalized itching with no identifiable cause or rashes. Start hydroxyzine as needed for itching. Follow up if symptoms worsen or are not controlled.        Relevant Medications   hydrOXYzine (ATARAX/VISTARIL) 25 MG tablet

## 2015-05-19 NOTE — Assessment & Plan Note (Signed)
Stable with current dosage of Celexa and reports that his mood is improved. Denies suicidal ideation. Continue current dosage of Celexa with changes per psychiatry.

## 2015-05-19 NOTE — Progress Notes (Signed)
Pre visit review using our clinic review tool, if applicable. No additional management support is needed unless otherwise documented below in the visit note. 

## 2015-05-19 NOTE — Patient Instructions (Signed)
Thank you for choosing [facility].  Summary/Instructions:  Please continue to take your medications as prescribed.  Please follow up with Dr. Janace Hoard and Dr. Toy Care as scheduled.  Please have Dr. Janace Hoard forward [place] notes.  Follow up for a physical at your convenience.   Your prescription(s) have been submitted to your pharmacy or been printed and provided for you. Please take as directed and contact our office if you believe you are having problem(s) with the medication(s) or have any questions.  If your symptoms worsen or fail to improve, please contact our office for further instruction, or in case of emergency go directly to the emergency room at the closest medical facility.

## 2015-05-19 NOTE — Assessment & Plan Note (Signed)
Stable with current dosage of Celexa and denies having to use lorazepam often. Denies adverse side effects. Continue current dosage of Celexa and lorazepam. Changes per psychiatry.

## 2015-05-19 NOTE — Assessment & Plan Note (Signed)
Generalized itching with no identifiable cause or rashes. Start hydroxyzine as needed for itching. Follow up if symptoms worsen or are not controlled.

## 2015-05-19 NOTE — Assessment & Plan Note (Signed)
CT scan consistent with squamous cell carcinoma and is scheduled for a PET scan. Physical exam with no evidence of shortness of breath or difficulty swallowing.  Continue conservative management and follow up with Dr. Janace Hoard of ENT for further management.

## 2015-05-19 NOTE — Assessment & Plan Note (Signed)
Mood is stable and adequately controlled on current medication without adverse side effects or suicidal ideation. Continue current dosage of Celexa and lorazepam with management by Dr. Toy Care of psychiatry. Follow up as needed.

## 2015-05-20 ENCOUNTER — Ambulatory Visit (HOSPITAL_COMMUNITY)
Admission: RE | Admit: 2015-05-20 | Discharge: 2015-05-20 | Disposition: A | Discharge status: Home / Self Care | Payer: BLUE CROSS/BLUE SHIELD | Source: Ambulatory Visit | Attending: Otolaryngology | Admitting: Otolaryngology

## 2015-05-20 ENCOUNTER — Ambulatory Visit (HOSPITAL_COMMUNITY): Payer: No Typology Code available for payment source

## 2015-05-20 DIAGNOSIS — Z0189 Encounter for other specified special examinations: Secondary | ICD-10-CM | POA: Insufficient documentation

## 2015-05-20 DIAGNOSIS — C4442 Squamous cell carcinoma of skin of scalp and neck: Secondary | ICD-10-CM | POA: Insufficient documentation

## 2015-05-20 DIAGNOSIS — R590 Localized enlarged lymph nodes: Secondary | ICD-10-CM | POA: Diagnosis not present

## 2015-05-20 LAB — GLUCOSE, CAPILLARY: GLUCOSE-CAPILLARY: 93 mg/dL (ref 65–99)

## 2015-05-20 MED ORDER — FLUDEOXYGLUCOSE F - 18 (FDG) INJECTION
8.5000 | Freq: Once | INTRAVENOUS | Status: AC | PRN
  Administered 2015-05-20: 8.5 via INTRAVENOUS

## 2015-05-27 ENCOUNTER — Encounter: Payer: Self-pay | Admitting: Family

## 2015-05-27 ENCOUNTER — Telehealth: Payer: Self-pay | Admitting: Family

## 2015-05-27 ENCOUNTER — Ambulatory Visit (INDEPENDENT_AMBULATORY_CARE_PROVIDER_SITE_OTHER): Payer: BLUE CROSS/BLUE SHIELD | Admitting: Family

## 2015-05-27 VITALS — BP 142/94 | HR 112 | Temp 97.6°F | Resp 18 | Ht 67.0 in | Wt 167.0 lb

## 2015-05-27 DIAGNOSIS — C801 Malignant (primary) neoplasm, unspecified: Secondary | ICD-10-CM

## 2015-05-27 DIAGNOSIS — IMO0002 Reserved for concepts with insufficient information to code with codable children: Secondary | ICD-10-CM

## 2015-05-27 NOTE — Telephone Encounter (Signed)
Patient was recently diagnosed with neck cancer by Dr.Byers [facility] ENT).  Patient is requesting Marya Amsler refer him to [facility].  Patient is requesting Dr. Gearldine Bienenstock.  But if he is not available he will take anyone.

## 2015-05-27 NOTE — Progress Notes (Signed)
Pre visit review using our clinic review tool, if applicable. No additional management support is needed unless otherwise documented below in the visit note. 

## 2015-05-27 NOTE — Progress Notes (Addendum)
Subjective:    Patient ID: [name], male    DOB: [birth year], 35 y.o.   MRN: [id number]  Chief Complaint  Patient presents with  . Joint Pain    having joint pain and describes an itching that feels like its inside of his body, has had swelling throughout his body    HPI:  [name] is a 35 y.o. male who  has a past medical history of Opiate dependence ([place]); Hepatitis C; Anxiety; Depression; and Kidney stone. and presents today for an acute office visit.  Recently diagnosed with squamous cell carcinoma and completed a PET scan through Dr. Janace Hoard of ENT. The PET scan showed a necrotic level 2 lymph node on the left mild peripheral hypermetabolic activity. No definite primary malignancy of the pharyngeal muscosal space or chest identified. Small focus of indeterminate activity adjancent to the mastoid process and indeterminate activity within small righ axillary lymph nodes. He is experiencing the associated symptom of stress and frustration with the current process of getting tests and indicates that he is slightly the doctor "do there thing".  Allergies  Allergen Reactions  . Ceclor [Cefaclor] Other (See Comments)    Does not recall, was a small child      Current Outpatient Prescriptions on File Prior to Visit  Medication Sig Dispense Refill  . amphetamine-dextroamphetamine (ADDERALL) 30 MG tablet Take 30 mg by mouth daily.    . citalopram (CELEXA) 40 MG tablet Take 1 tablet (40 mg total) by mouth daily. 30 tablet 0  . hydrOXYzine (ATARAX/VISTARIL) 25 MG tablet Take 1 tablet (25 mg total) by mouth 3 (three) times daily as needed. For itching 30 tablet 0  . LORazepam (ATIVAN) 1 MG tablet Take 1 mg by mouth every 8 (eight) hours.     No current facility-administered medications on file prior to visit.     Past Surgical History  Procedure Laterality Date  . Appendectomy        Review of Systems  Constitutional: Positive for unexpected weight change. Negative for  fever and chills.  Respiratory: Negative for chest tightness and shortness of breath.   Cardiovascular: Negative for chest pain, palpitations and leg swelling.  Psychiatric/Behavioral: Negative for suicidal ideas and sleep disturbance. The patient is nervous/anxious.       Objective:    BP 142/94 mmHg  Pulse 112  Temp(Src) 97.6 F (36.4 C) (Oral)  Resp 18  Ht 5\' 7"  (1.702 m)  Wt 167 lb (75.751 kg)  BMI 26.15 kg/m2  SpO2 98% Nursing note and vital signs reviewed.  Physical Exam  Constitutional: He is oriented to person, place, and time. He appears well-developed and well-nourished. No distress.  Cardiovascular: Normal rate, regular rhythm, normal heart sounds and intact distal pulses.   Pulmonary/Chest: Effort normal and breath sounds normal.  Neurological: He is alert and oriented to person, place, and time.  Skin: Skin is warm and dry.  Psychiatric: His behavior is normal. Judgment and thought content normal. His mood appears anxious.       Assessment & Plan:   Problem List Items Addressed This Visit      Other   Squamous cell carcinoma ([place]) - Primary    Currently working through gathering information about potential source for the squamous cell carcinoma. Symptoms experienced are consistent with anxiety. Discussed stress management and coping mechanisms. Encouraged him to write down all of his questions and follow up with Dr. Janace Hoard which may provide more answers and less stress. He is requesting a  second opinion and referral to [facility]. Referral placed. Continue to monitor at this time and follow up with primary care as needed pending tests and potential treatment.       Relevant Orders   Ambulatory referral to ENT

## 2015-05-27 NOTE — Patient Instructions (Addendum)
Thank you for choosing [facility].  Summary/Instructions:   If your symptoms worsen or fail to improve, please contact our office for further instruction, or in case of emergency go directly to the emergency room at the closest medical facility.   Please write down all questions and discuss them with Dr. Janace Hoard.   A referral will be sent to Dr. Nicolette Bang.

## 2015-05-27 NOTE — Assessment & Plan Note (Signed)
Currently working through gathering information about potential source for the squamous cell carcinoma. Symptoms experienced are consistent with anxiety. Discussed stress management and coping mechanisms. Encouraged him to write down all of his questions and follow up with Dr. Janace Hoard which may provide more answers and less stress. He is requesting a second opinion and referral to [facility]. Referral placed. Continue to monitor at this time and follow up with primary care as needed pending tests and potential treatment.

## 2015-05-27 NOTE — Telephone Encounter (Signed)
Addressing at [place] today

## 2015-06-03 ENCOUNTER — Other Ambulatory Visit: Payer: Self-pay | Admitting: Otolaryngology

## 2015-07-27 ENCOUNTER — Telehealth: Payer: Self-pay | Admitting: Family

## 2015-07-27 DIAGNOSIS — IMO0002 Reserved for concepts with insufficient information to code with codable children: Secondary | ICD-10-CM

## 2015-07-27 NOTE — Telephone Encounter (Signed)
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 She would like to know if PCP would okay. She says that it is needed for pt condition (cancer)  Please advise pt further for scheduling    CB: [phone]

## 2015-08-01 NOTE — Telephone Encounter (Signed)
Spoke to pt to verify. Pt needed Hep C Genotype lab drawn. Lab has been entered.

## 2015-08-01 NOTE — Telephone Encounter (Signed)
Please advise what vaccine pt should have since there is not a Hep C vaccine.

## 2015-08-01 NOTE — Telephone Encounter (Signed)
Please recheck with the patient as there is no Hepatitis C vaccination.

## 2015-08-08 ENCOUNTER — Other Ambulatory Visit: Payer: BLUE CROSS/BLUE SHIELD

## 2015-08-08 DIAGNOSIS — IMO0002 Reserved for concepts with insufficient information to code with codable children: Secondary | ICD-10-CM

## 2015-08-11 LAB — HEPATITIS C GENOTYPE

## 2015-12-02 ENCOUNTER — Encounter (HOSPITAL_COMMUNITY): Payer: Self-pay | Admitting: *Deleted

## 2015-12-02 ENCOUNTER — Ambulatory Visit (HOSPITAL_COMMUNITY)
Admission: EM | Admit: 2015-12-02 | Discharge: 2015-12-02 | Disposition: A | Discharge status: Home / Self Care | Payer: BLUE CROSS/BLUE SHIELD | Attending: Emergency Medicine | Admitting: Emergency Medicine

## 2015-12-02 ENCOUNTER — Ambulatory Visit (INDEPENDENT_AMBULATORY_CARE_PROVIDER_SITE_OTHER): Payer: BLUE CROSS/BLUE SHIELD

## 2015-12-02 DIAGNOSIS — Y92099 Unspecified place in other non-institutional residence as the place of occurrence of the external cause: Secondary | ICD-10-CM

## 2015-12-02 DIAGNOSIS — M546 Pain in thoracic spine: Secondary | ICD-10-CM

## 2015-12-02 DIAGNOSIS — W19XXXA Unspecified fall, initial encounter: Secondary | ICD-10-CM

## 2015-12-02 DIAGNOSIS — T148XXA Other injury of unspecified body region, initial encounter: Secondary | ICD-10-CM

## 2015-12-02 DIAGNOSIS — T148 Other injury of unspecified body region: Secondary | ICD-10-CM

## 2015-12-02 DIAGNOSIS — R0781 Pleurodynia: Secondary | ICD-10-CM

## 2015-12-02 DIAGNOSIS — Y92009 Unspecified place in unspecified non-institutional (private) residence as the place of occurrence of the external cause: Secondary | ICD-10-CM

## 2015-12-02 MED ORDER — ONDANSETRON HCL 4 MG PO TABS: 4.0000 mg | ORAL_TABLET | Freq: Four times a day (QID) | ORAL | 0 refills | Status: DC
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
## 2015-12-02 NOTE — ED Provider Notes (Signed)
CSN: [id number]     Arrival date & time 12/02/15  1156 History   First MD Initiated Contact with Patient 12/02/15 1237     Chief Complaint  Patient presents with  . Fall   (Consider location/radiation/quality/duration/timing/severity/associated sxs/prior Treatment) HPI [name] is a 35 y.o. male presenting to UC with c/o Right mid to lower back and side pain.  Pt stated he slipped in his socks down wood stairs at home about 2 days ago.  Pt states he had gotten home from having a PET scan and was tired, believes he fell because he was not fully awake.  He did take some Tylenol PTA.  Pain is aching and sore, 7/10, worse with deep breathing, palpation and certain positions. He also reports mild nausea but denies vomiting. Denies urinary symptoms including no hematuria. Denies hitting his head or LOC. No other injuries. He is not on blood thinners. Denies numbness or tingling in arms or legs.    Past Medical History:  Diagnosis Date  . Anxiety   . Depression   . Hepatitis C   . Kidney stone   . Opiate dependence ([place])    Past Surgical History:  Procedure Laterality Date  . APPENDECTOMY     Family History  Problem Relation Age of Onset  . Kidney cancer Father     renal cell carcenoma  . Hodgkin's lymphoma Mother   . Colon cancer Paternal Grandfather     great  . Lung cancer Paternal Grandmother   . Diabetes Maternal Grandmother   . Pancreatic cancer Maternal Grandmother   . Melanoma Maternal Grandfather    Social History  Substance Use Topics  . Smoking status: Former Smoker    Packs/day: 0.50    Years: 9.00    Types: Cigarettes    Quit date: 04/16/2008  . Smokeless tobacco: Former Systems developer    Types: Chew    Quit date: 04/16/2008  . Alcohol use No     Comment: 1 per day    Review of Systems  Constitutional: Negative for chills and fever.  Respiratory: Negative for chest tightness and shortness of breath.   Cardiovascular: Positive for chest pain (Right side "rib" pain).  Negative for palpitations.  Gastrointestinal: Negative for abdominal pain.  Musculoskeletal: Positive for back pain and myalgias. Negative for arthralgias, gait problem, joint swelling, neck pain and neck stiffness.  Skin: Negative for color change, rash and wound.  Neurological: Negative for weakness and numbness.    Allergies  Ceclor [cefaclor]  Home Medications   Prior to Admission medications   Medication Sig Start Date End Date Taking? Authorizing Provider  amphetamine-dextroamphetamine (ADDERALL) 30 MG tablet Take 30 mg by mouth daily.    Historical Provider, MD  citalopram (CELEXA) 40 MG tablet Take 1 tablet (40 mg total) by mouth daily. 12/09/14   Kerrie Buffalo, NP  hydrOXYzine (ATARAX/VISTARIL) 25 MG tablet Take 1 tablet (25 mg total) by mouth 3 (three) times daily as needed. For itching 05/19/15   Golden Circle, FNP  LORazepam (ATIVAN) 1 MG tablet Take 1 mg by mouth every 8 (eight) hours.    Historical Provider, MD  ondansetron (ZOFRAN) 4 MG tablet Take 1 tablet (4 mg total) by mouth every 6 (six) hours. 12/02/15   Noland Fordyce, PA-C   Meds Ordered and Administered this Visit  Medications - No data to display  BP 136/87 (BP Location: Left Arm)   Pulse 84   Temp 98.1 F (36.7 C) (Oral)   Resp 16  SpO2 100%  No data found.   Physical Exam  Constitutional: He is oriented to person, place, and time. He appears well-developed and well-nourished.  HENT:  Head: Normocephalic and atraumatic.  Eyes: EOM are normal.  Neck: Normal range of motion.  Cardiovascular: Normal rate.   Pulmonary/Chest: Effort normal. No respiratory distress. He has no wheezes. He has no rales.     He exhibits tenderness.  Right lateral and posterior tenderness to ribs and mid to lower back. Lungs: CTAB No respiratory distress.   Musculoskeletal: Normal range of motion. He exhibits tenderness. He exhibits no edema.  Tenderness to mid-lower Right side of back. No crepitus or step-offs. Full  ROM upper and lower extremities with 5/5 strength   Neurological: He is alert and oriented to person, place, and time.  Skin: Skin is warm and dry.  Right side of trunk and back: skin in tact, no ecchymosis, or erythema.   Psychiatric: He has a normal mood and affect. His behavior is normal.  Nursing note and vitals reviewed.   Urgent Care Course   Clinical Course    Procedures (including critical care time)  Labs Review Labs Reviewed - No data to display  Imaging Review Dg Ribs Unilateral W/chest Right  Result Date: 12/02/2015 CLINICAL DATA:  Golden Circle down a flight of wooden stairs 2 days ago landing on his back. Now with posterior right lower rib and back pain and dyspnea. Initial encounter. EXAM: RIGHT RIBS AND CHEST - 3+ VIEW COMPARISON:  None. FINDINGS: The lungs are clear wiithout focal pneumonia, edema, pneumothorax or pleural effusion. The cardiopericardial silhouette is within normal limits for size. The visualized bony structures of the thorax are intact. Oblique views of the right ribs show no displaced acute right-sided rib fracture. IMPRESSION: Negative. Electronically Signed   By: Misty Stanley M.D.   On: 12/02/2015 13:22   Dg Thoracic Spine W/swimmers  Result Date: 12/02/2015 CLINICAL DATA:  Pt reports fall down a flight of wooden stairs 2 days ago and landing on his back; he now has posterior right lower rib and back pain with some dyspnea EXAM: THORACIC SPINE - 3 VIEWS COMPARISON:  None. FINDINGS: There is no evidence of thoracic spine fracture. Alignment is normal. No other significant bone abnormalities are identified. IMPRESSION: Negative. Electronically Signed   By: Misty Stanley M.D.   On: 12/02/2015 13:25   Dg Lumbar Spine Complete  Result Date: 12/02/2015 CLINICAL DATA:  Pt reports fall down a flight of wooden stairs 2 days ago and landing on his back; he now has posterior right lower rib and back pain with some dyspnea EXAM: LUMBAR SPINE - COMPLETE 4+ VIEW  COMPARISON:  None. FINDINGS: No evidence for acute fracture. Loss of disc height with endplate spurring is seen at L1-2. Facets are well aligned bilaterally. SI joints are normal bilaterally. IMPRESSION: Negative. Electronically Signed   By: Misty Stanley M.D.   On: 12/02/2015 13:24      MDM   1. Fall at home, initial encounter   2. Fall   3. Contusion   4. Right-sided thoracic back pain   5. Rib pain on right side    Pt c/o Right side, rib, and back pain after fall down stairs as home. No head injury. No respiratory distress. Tender to Right side and back.  Plain films: negative for bony injuries.  Per [place] Controlled Substance database pt is on Suboxone. Per PMH, pt has hx of opioid dependence. Pt did not request any pain medication  but he did request something for nausea. Rx: Zofran. Encouraged f/u with PCP in 1 week if not improving. Encouraged to try to breath regularly to help prevent pneumonia.  Encouraged alternating acetaminophen, ibuprofen, cool and warm compresses.    Noland Fordyce, PA-C 12/02/15 1350

## 2015-12-20 ENCOUNTER — Encounter: Payer: Self-pay | Admitting: Family

## 2015-12-20 ENCOUNTER — Ambulatory Visit (INDEPENDENT_AMBULATORY_CARE_PROVIDER_SITE_OTHER): Payer: BLUE CROSS/BLUE SHIELD | Admitting: Family

## 2015-12-20 DIAGNOSIS — Z23 Encounter for immunization: Secondary | ICD-10-CM

## 2015-12-20 DIAGNOSIS — R6889 Other general symptoms and signs: Secondary | ICD-10-CM | POA: Diagnosis not present

## 2015-12-20 DIAGNOSIS — R238 Other skin changes: Secondary | ICD-10-CM

## 2015-12-20 DIAGNOSIS — Z0000 Encounter for general adult medical examination without abnormal findings: Secondary | ICD-10-CM

## 2015-12-20 DIAGNOSIS — Z0001 Encounter for general adult medical examination with abnormal findings: Secondary | ICD-10-CM | POA: Insufficient documentation

## 2015-12-20 DIAGNOSIS — R233 Spontaneous ecchymoses: Secondary | ICD-10-CM | POA: Insufficient documentation

## 2015-12-20 NOTE — Progress Notes (Signed)
Subjective:    Patient ID: [name], male    DOB: 1981/04/08, 34 y.o.   MRN: [id number]  Chief Complaint  Patient presents with  . CPE    not fasting, wants basic blood work to check everything    HPI:  [name] is a 35 y.o. male who presents today for an annual wellness visit.   1) Health Maintenance -   Diet - Averaging about 3 meals per day consisting of a regular diet; 1-2 cups of caffeine every couple of days  Exercise - Throughout the day; yard work; working on increasing his activity   2) Publishing rights manager / Immunizations:  Dental -- Up to date  Vision --  Up to date    Health Maintenance  Topic Date Due  . HIV Screening  03/27/1998  . INFLUENZA VACCINE  11/15/2015  . TETANUS/TDAP  10/05/2023    Immunization History  Administered Date(s) Administered  . Influenza Split 02/14/2013  . Influenza,inj,Quad PF,36+ Mos 05/19/2015, 12/20/2015  . Influenza-Unspecified 12/15/2013     Allergies  Allergen Reactions  . Ceclor [Cefaclor] Other (See Comments)    Does not recall, was a small child      Outpatient Medications Prior to Visit  Medication Sig Dispense Refill  . citalopram (CELEXA) 40 MG tablet Take 1 tablet (40 mg total) by mouth daily. 30 tablet 0  . ondansetron (ZOFRAN) 4 MG tablet Take 1 tablet (4 mg total) by mouth every 6 (six) hours. 12 tablet 0  . amphetamine-dextroamphetamine (ADDERALL) 30 MG tablet Take 30 mg by mouth daily.    . hydrOXYzine (ATARAX/VISTARIL) 25 MG tablet Take 1 tablet (25 mg total) by mouth 3 (three) times daily as needed. For itching 30 tablet 0  . LORazepam (ATIVAN) 1 MG tablet Take 1 mg by mouth every 8 (eight) hours.     No facility-administered medications prior to visit.      Past Medical History:  Diagnosis Date  . Anxiety   . Depression   . Hepatitis C   . Kidney stone   . Opiate dependence ([place])      Past Surgical History:  Procedure Laterality Date  . APPENDECTOMY    . left axillary  node biopsy    . RADICAL NECK DISSECTION Left   . TONSILLECTOMY       Family History  Problem Relation Age of Onset  . Kidney cancer Father     renal cell carcenoma  . Hodgkin's lymphoma Mother   . Colon cancer Paternal Grandfather     great  . Lung cancer Paternal Grandmother   . Diabetes Maternal Grandmother   . Pancreatic cancer Maternal Grandmother   . Melanoma Maternal Grandfather      Social History   Social History  . Marital status: Single    Spouse name: N/A  . Number of children: 1  . Years of education: 27   Occupational History  . Mechanic    Social History Main Topics  . Smoking status: Former Smoker    Packs/day: 0.50    Years: 9.00    Types: Cigarettes    Quit date: 04/16/2008  . Smokeless tobacco: Former Systems developer    Types: Chew    Quit date: 04/16/2008  . Alcohol use No  . Drug use:     Frequency: 7.0 times per week    Types: Marijuana  . Sexual activity: Not on file   Other Topics Concern  . Not on file   Social History Narrative  Fun: Play guitar and hang out with his daughter    Review of Systems  Constitutional: Denies fever, chills, fatigue, or significant weight gain/loss. HENT: Head: Denies headache or neck pain Ears: Denies changes in hearing, ringing in ears, earache, drainage Nose: Denies discharge, stuffiness, itching, nosebleed, sinus pain Throat: Denies sore throat, hoarseness, dry mouth, sores, thrush Eyes: Denies loss/changes in vision, pain, redness, blurry/double vision, flashing lights Cardiovascular: Denies chest pain/discomfort, tightness, palpitations, shortness of breath with activity, difficulty lying down, swelling, sudden awakening with shortness of breath Respiratory: Denies shortness of breath, cough, sputum production, wheezing Gastrointestinal: Denies dysphasia, heartburn, change in appetite, nausea, change in bowel habits, rectal bleeding, constipation, diarrhea, yellow skin or eyes Genitourinary: Denies frequency,  urgency, burning/pain, blood in urine, incontinence, change in urinary strength. Musculoskeletal: Denies muscle/joint pain, stiffness, back pain, redness or swelling of joints, trauma Skin: Denies rashes, lumps, itching, dryness, color changes, or hair/nail changes Neurological: Denies dizziness, fainting, seizures, weakness, numbness, tingling, tremor Psychiatric - Denies nervousness, stress, depression or memory loss Endocrine: Denies heat or cold intolerance, sweating, frequent urination, excessive thirst, changes in appetite Hematologic: Denies ease of  bleeding Positive for ease of bruising.     Objective:     BP (!) 162/92 (BP Location: Left Arm, Patient Position: Sitting, Cuff Size: Normal)   Pulse 73   Temp 97.6 F (36.4 C) (Oral)   Resp 16   Ht 5\' 7"  (1.702 m)   Wt 146 lb (66.2 kg)   SpO2 98%   BMI 22.87 kg/m  Nursing note and vital signs reviewed.  Physical Exam  Constitutional: He is oriented to person, place, and time. He appears well-developed and well-nourished.  HENT:  Head: Normocephalic.  Right Ear: Hearing, tympanic membrane, external ear and ear canal normal.  Left Ear: Hearing, tympanic membrane, external ear and ear canal normal.  Nose: Nose normal.  Mouth/Throat: Uvula is midline, oropharynx is clear and moist and mucous membranes are normal.  Eyes: Conjunctivae and EOM are normal. Pupils are equal, round, and reactive to light.  Neck: Neck supple. No JVD present. No tracheal deviation present. No thyromegaly present.  Cardiovascular: Normal rate, regular rhythm, normal heart sounds and intact distal pulses.   Pulmonary/Chest: Effort normal and breath sounds normal.  Abdominal: Soft. Bowel sounds are normal. He exhibits no distension and no mass. There is no tenderness. There is no rebound and no guarding.  Musculoskeletal: Normal range of motion. He exhibits no edema or tenderness.  Lymphadenopathy:    He has no cervical adenopathy.  Neurological: He is  alert and oriented to person, place, and time. He has normal reflexes. No cranial nerve deficit. He exhibits normal muscle tone. Coordination normal.  Skin: Skin is warm and dry.  Psychiatric: He has a normal mood and affect. His behavior is normal. Judgment and thought content normal.       Assessment & Plan:   Problem List Items Addressed This Visit      Other   Encounter for well adult exam with abnormal findings    1) Anticipatory Guidance: Discussed importance of wearing a seatbelt while driving and not texting while driving; changing batteries in smoke detector at least once annually; wearing suntan lotion when outside; eating a balanced and moderate diet; getting physical activity at least 30 minutes per day.  2) Immunizations / Screenings / Labs:  Influenza updated today. All other immunizations are up-to-date per recommendations. All screenings are up-to-date per recommendations. Obtain CBC, CMET,TSH  and lipid profile.  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 Continue current healthy lifestyle behaviors and choices. Currently working to complete treatment for hepatitis C. Does continue to remain anxious secondary to his most recent history. Follow-up prevention exam in 1 year. Follow-up office visit pending blood work as indicated.       Relevant Orders   CBC   Comprehensive metabolic panel   TSH   Lipid panel   INR/PT   IBC panel   Technologist smear review   Bruises easily    Patient noted to bruise easily over the last several weeks with no significant identifiable cause. Has noted bruises without trauma located on his wrist and posterior right calf. Obtain INR/PT, IBC panel, and smear review rule out underlying causes. Not currently on medication that would result in increased  bruising. Follow-up pending blood work results or if symptoms worsen.       Other Visit Diagnoses    Encounter for immunization       Relevant Orders   Flu Vaccine QUAD 36+ mos IM (Completed)       I have discontinued Mr. Bazaldua's LORazepam and hydrOXYzine. I am also having him maintain his citalopram, ondansetron, Ledipasvir-Sofosbuvir, ALPRAZolam, and amphetamine-dextroamphetamine.   Meds ordered this encounter  Medications  . Ledipasvir-Sofosbuvir (HARVONI) 90-400 MG TABS    Sig: Take by mouth.  . ALPRAZolam (XANAX) 1 MG tablet    Sig: Take 1 mg by mouth at bedtime as needed for anxiety.  Marland Kitchen amphetamine-dextroamphetamine (ADDERALL XR) 30 MG 24 hr capsule    Sig: Take 30 mg by mouth daily.     Follow-up: Return in about 1 month (around 01/19/2016), or if symptoms worsen or fail to improve.   Mauricio Po, FNP

## 2015-12-20 NOTE — Assessment & Plan Note (Signed)
1) Anticipatory Guidance: Discussed importance of wearing a seatbelt while driving and not texting while driving; changing batteries in smoke detector at least once annually; wearing suntan lotion when outside; eating a balanced and moderate diet; getting physical activity at least 30 minutes per day.  2) Immunizations / Screenings / Labs:  Influenza updated today. All other immunizations are up-to-date per recommendations. All screenings are up-to-date per recommendations. Obtain CBC, CMET,TSH  and lipid profile.    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 Continue current healthy lifestyle behaviors and choices. Currently working to complete treatment for hepatitis C. Does continue to remain anxious secondary to his most recent history. Follow-up prevention exam in 1 year. Follow-up office visit pending blood work as indicated.

## 2015-12-20 NOTE — Patient Instructions (Signed)
Thank you for choosing [facility].  SUMMARY AND INSTRUCTIONS:  Labs:  Please stop by the lab on the lower level of the building for your blood work. Your results will be released to [facility] (or called to you) after review, usually within 72 hours after test completion. If any changes need to be made, you will be notified at that same time.  1.) The lab is open from 7:30am to 5:30 pm Monday-Friday 2.) No appointment is necessary 3.) Fasting (if needed) is 6-8 hours after food and drink; black coffee and water are okay   Follow up:  If your symptoms worsen or fail to improve, please contact our office for further instruction, or in case of emergency go directly to the emergency room at the closest medical facility.    Health Maintenance, Male A healthy lifestyle and preventative care can promote health and wellness.  Maintain regular health, dental, and eye exams.  Eat a healthy diet. Foods like vegetables, fruits, whole grains, low-fat dairy products, and lean protein foods contain the nutrients you need and are low in calories. Decrease your intake of foods high in solid fats, added sugars, and salt. Get information about a proper diet from your health care provider, if necessary.  Regular physical exercise is one of the most important things you can do for your health. Most adults should get at least 150 minutes of moderate-intensity exercise (any activity that increases your heart rate and causes you to sweat) each week. In addition, most adults need muscle-strengthening exercises on 2 or more days a week.   Maintain a healthy weight. The body mass index (BMI) is a screening tool to identify possible weight problems. It provides an estimate of body fat based on height and weight. Your health care provider can find your BMI and can help you achieve or maintain a healthy weight. For males 20 years and older:  A BMI below 18.5 is considered underweight.  A BMI of 18.5 to 24.9 is  normal.  A BMI of 25 to 29.9 is considered overweight.  A BMI of 30 and above is considered obese.  Maintain normal blood lipids and cholesterol by exercising and minimizing your intake of saturated fat. Eat a balanced diet with plenty of fruits and vegetables. Blood tests for lipids and cholesterol should begin at age 70 and be repeated every 5 years. If your lipid or cholesterol levels are high, you are over age 43, or you are at high risk for heart disease, you may need your cholesterol levels checked more frequently.Ongoing high lipid and cholesterol levels should be treated with medicines if diet and exercise are not working.  If you smoke, find out from your health care provider how to quit. If you do not use tobacco, do not start.  Lung cancer screening is recommended for adults aged 45-80 years who are at high risk for developing lung cancer because of a history of smoking. A yearly low-dose CT scan of the lungs is recommended for people who have at least a 30-pack-year history of smoking and are current smokers or have quit within the past 15 years. A pack year of smoking is smoking an average of 1 pack of cigarettes a day for 1 year (for example, a 30-pack-year history of smoking could mean smoking 1 pack a day for 30 years or 2 packs a day for 15 years). Yearly screening should continue until the smoker has stopped smoking for at least 15 years. Yearly screening should be stopped  for people who develop a health problem that would prevent them from having lung cancer treatment.  If you choose to drink alcohol, do not have more than 2 drinks per day. One drink is considered to be 12 oz (360 mL) of beer, 5 oz (150 mL) of wine, or 1.5 oz (45 mL) of liquor.  Avoid the use of street drugs. Do not share needles with anyone. Ask for help if you need support or instructions about stopping the use of drugs.  High blood pressure causes heart disease and increases the risk of stroke. High blood  pressure is more likely to develop in:  People who have blood pressure in the end of the normal range (100-139/85-89 mm Hg).  People who are overweight or obese.  People who are African American.  If you are 77-60 years of age, have your blood pressure checked every 3-5 years. If you are 70 years of age or older, have your blood pressure checked every year. You should have your blood pressure measured twice--once when you are at a hospital or clinic, and once when you are not at a hospital or clinic. Record the average of the two measurements. To check your blood pressure when you are not at a hospital or clinic, you can use:  An automated blood pressure machine at a pharmacy.  A home blood pressure monitor.  If you are 55-79 years old, ask your health care provider if you should take aspirin to prevent heart disease.  Diabetes screening involves taking a blood sample to check your fasting blood sugar level. This should be done once every 3 years after age 72 if you are at a normal weight and without risk factors for diabetes. Testing should be considered at a younger age or be carried out more frequently if you are overweight and have at least 1 risk factor for diabetes.  Colorectal cancer can be detected and often prevented. Most routine colorectal cancer screening begins at the age of 53 and continues through age 66. However, your health care provider may recommend screening at an earlier age if you have risk factors for colon cancer. On a yearly basis, your health care provider may provide home test kits to check for hidden blood in the stool. A small camera at the end of a tube may be used to directly examine the colon (sigmoidoscopy or colonoscopy) to detect the earliest forms of colorectal cancer. Talk to your health care provider about this at age 61 when routine screening begins. A direct exam of the colon should be repeated every 5-10 years through age 26, unless early forms of  precancerous polyps or small growths are found.  People who are at an increased risk for hepatitis B should be screened for this virus. You are considered at high risk for hepatitis B if:  You were born in a country where hepatitis B occurs often. Talk with your health care provider about which countries are considered high risk.  Your parents were born in a high-risk country and you have not received a shot to protect against hepatitis B (hepatitis B vaccine).  You have HIV or AIDS.  You use needles to inject street drugs.  You live with, or have sex with, someone who has hepatitis B.  You are a man who has sex with other men (MSM).  You get hemodialysis treatment.  You take certain medicines for conditions like cancer, organ transplantation, and autoimmune conditions.  Hepatitis C blood testing is recommended for  all people born from [birth year] through 1963 and any individual with known risk factors for hepatitis C.  Healthy men should no longer receive prostate-specific antigen (PSA) blood tests as part of routine cancer screening. Talk to your health care provider about prostate cancer screening.  Testicular cancer screening is not recommended for adolescents or adult males who have no symptoms. Screening includes self-exam, a health care provider exam, and other screening tests. Consult with your health care provider about any symptoms you have or any concerns you have about testicular cancer.  Practice safe sex. Use condoms and avoid high-risk sexual practices to reduce the spread of sexually transmitted infections (STIs).  You should be screened for STIs, including gonorrhea and chlamydia if:  You are sexually active and are younger than 24 years.  You are older than 24 years, and your health care provider tells you that you are at risk for this type of infection.  Your sexual activity has changed since you were last screened, and you are at an increased risk for chlamydia or  gonorrhea. Ask your health care provider if you are at risk.  If you are at risk of being infected with HIV, it is recommended that you take a prescription medicine daily to prevent HIV infection. This is called pre-exposure prophylaxis (PrEP). You are considered at risk if:  You are a man who has sex with other men (MSM).  You are a heterosexual man who is sexually active with multiple partners.  You take drugs by injection.  You are sexually active with a partner who has HIV.  Talk with your health care provider about whether you are at high risk of being infected with HIV. If you choose to begin PrEP, you should first be tested for HIV. You should then be tested every 3 months for as long as you are taking PrEP.  Use sunscreen. Apply sunscreen liberally and repeatedly throughout the day. You should seek shade when your shadow is shorter than you. Protect yourself by wearing long sleeves, pants, a wide-brimmed hat, and sunglasses year round whenever you are outdoors.  Tell your health care provider of new moles or changes in moles, especially if there is a change in shape or color. Also, tell your health care provider if a mole is larger than the size of a pencil eraser.  A one-time screening for abdominal aortic aneurysm (AAA) and surgical repair of large AAAs by ultrasound is recommended for men aged 9-75 years who are current or former smokers.  Stay current with your vaccines (immunizations).   This information is not intended to replace advice given to you by your health care provider. Make sure you discuss any questions you have with your health care provider.   Document Released: 09/29/2007 Document Revised: 04/23/2014 Document Reviewed: 08/28/2010 Elsevier Interactive Patient Education [employer].

## 2015-12-20 NOTE — Assessment & Plan Note (Signed)
Patient noted to bruise easily over the last several weeks with no significant identifiable cause. Has noted bruises without trauma located on his wrist and posterior right calf. Obtain INR/PT, IBC panel, and smear review rule out underlying causes. Not currently on medication that would result in increased bruising. Follow-up pending blood work results or if symptoms worsen.

## 2016-01-10 ENCOUNTER — Other Ambulatory Visit: Payer: Self-pay | Admitting: Family

## 2016-01-10 ENCOUNTER — Encounter: Payer: Self-pay | Admitting: Physician Assistant

## 2016-01-10 ENCOUNTER — Ambulatory Visit (INDEPENDENT_AMBULATORY_CARE_PROVIDER_SITE_OTHER): Payer: BLUE CROSS/BLUE SHIELD | Admitting: Physician Assistant

## 2016-01-10 ENCOUNTER — Other Ambulatory Visit (INDEPENDENT_AMBULATORY_CARE_PROVIDER_SITE_OTHER): Payer: BLUE CROSS/BLUE SHIELD

## 2016-01-10 VITALS — BP 140/80 | HR 84 | Ht 67.0 in | Wt 142.0 lb

## 2016-01-10 DIAGNOSIS — R112 Nausea with vomiting, unspecified: Secondary | ICD-10-CM | POA: Diagnosis not present

## 2016-01-10 DIAGNOSIS — R634 Abnormal weight loss: Secondary | ICD-10-CM

## 2016-01-10 DIAGNOSIS — Z0001 Encounter for general adult medical examination with abnormal findings: Secondary | ICD-10-CM | POA: Diagnosis not present

## 2016-01-10 DIAGNOSIS — K219 Gastro-esophageal reflux disease without esophagitis: Secondary | ICD-10-CM

## 2016-01-10 DIAGNOSIS — R6889 Other general symptoms and signs: Secondary | ICD-10-CM | POA: Diagnosis not present

## 2016-01-10 DIAGNOSIS — R5383 Other fatigue: Secondary | ICD-10-CM | POA: Diagnosis not present

## 2016-01-10 LAB — CBC
HEMATOCRIT: 41.4 % (ref 39.0–52.0)
HEMOGLOBIN: 14.4 g/dL (ref 13.0–17.0)
MCHC: 34.6 g/dL (ref 30.0–36.0)
MCV: 88.4 fl (ref 78.0–100.0)
Platelets: 334 10*3/uL (ref 150.0–400.0)
RBC: 4.69 Mil/uL (ref 4.22–5.81)
RDW: 12.8 % (ref 11.5–15.5)
WBC: 9.9 10*3/uL (ref 4.0–10.5)

## 2016-01-10 LAB — LIPID PANEL
CHOL/HDL RATIO: 2
CHOLESTEROL: 125 mg/dL (ref 0–200)
HDL: 55.4 mg/dL (ref >39.00)
LDL Cholesterol: 57 mg/dL (ref 0–99)
NonHDL: 69.8
TRIGLYCERIDES: 66 mg/dL (ref 0.0–149.0)
VLDL: 13.2 mg/dL (ref 0.0–40.0)

## 2016-01-10 LAB — COMPREHENSIVE METABOLIC PANEL
ALK PHOS: 48 U/L (ref 39–117)
ALT: 15 U/L (ref 0–53)
AST: 23 U/L (ref 0–37)
Albumin: 4.6 g/dL (ref 3.5–5.2)
BUN: 19 mg/dL (ref 6–23)
CO2: 28 mEq/L (ref 19–32)
Calcium: 9 mg/dL (ref 8.4–10.5)
Chloride: 105 mEq/L (ref 96–112)
Creatinine, Ser: 0.79 mg/dL (ref 0.40–1.50)
GFR: 120.21 mL/min (ref >60.00)
GLUCOSE: 98 mg/dL (ref 70–99)
POTASSIUM: 4 meq/L (ref 3.5–5.1)
Sodium: 140 mEq/L (ref 135–145)
TOTAL PROTEIN: 7.6 g/dL (ref 6.0–8.3)
Total Bilirubin: 0.6 mg/dL (ref 0.2–1.2)

## 2016-01-10 LAB — PROTIME-INR
INR: 1.1 ratio — ABNORMAL HIGH (ref 0.8–1.0)
Prothrombin Time: 11.7 s (ref 9.6–13.1)

## 2016-01-10 LAB — IBC PANEL
IRON: 105 ug/dL (ref 42–165)
SATURATION RATIOS: 28.3 % (ref 20.0–50.0)
TRANSFERRIN: 265 mg/dL (ref 212.0–360.0)

## 2016-01-10 LAB — TSH: TSH: 1 u[IU]/mL (ref 0.35–4.50)

## 2016-01-10 MED ORDER — ONDANSETRON HCL 4 MG PO TABS: 4.0000 mg | ORAL_TABLET | Freq: Four times a day (QID) | ORAL | 0 refills | Status: DC

## 2016-01-10 MED ORDER — PANTOPRAZOLE SODIUM 40 MG PO TBEC: DELAYED_RELEASE_TABLET | ORAL | 3 refills | Status: DC

## 2016-01-10 NOTE — Patient Instructions (Signed)
You have been scheduled for an endoscopy. Please follow written instructions given to you at your visit today. If you use inhalers (even only as needed), please bring them with you on the day of your procedure. Your physician has requested that you go to www.startemmi.com and enter the access code given to you at your visit today. This web site gives a general overview about your procedure. However, you should still follow specific instructions given to you by our office regarding your preparation for the procedure.  You have been scheduled for an abdominal ultrasound at [facility] (1st floor of hospital) on Monday 01-16-2016 at 8:00 am. Please arrive at 7:45 am  to your appointment for registration. Make certain not to have anything to eat or drink 6 hours prior to your appointment. Should you need to reschedule your appointment, please contact radiology at [phone]. This test typically takes about 30 minutes to perform.  We sent refills for the Zofran and Protonix ( Pantoprazole sodium 40 mg).

## 2016-01-10 NOTE — Progress Notes (Signed)
Subjective:    Patient ID: [name], male    DOB: 1980-09-09, 35 y.o.   MRN: [id number]  HPI [name] is a pleasant 35 year old white male, self-referred today and known to Dr. Ardis Hughs from colonoscopy done in June 2014 which was a normal exam. Patient comes in with complaints of ongoing fatigue and weight loss which she states totals about 30 pounds this year. This is despite eating well. He had been on Adderall long-term and stopped that about a month ago but has not noticed any change in his symptoms. He also was diagnosed with hepatitis C several months ago and was referred to the [facility] hepatology clinic and has just completed an 8 week course of Harvoni.[name] He says his weight loss started several months prior to starting the [place]. He did have good response to treatment with no detectable virus at the end of treatment. He is waiting for his 3 month follow-up. Patient also had a left radical neck dissection done in May 2017 for what was felt to initially be a head and neck squamous cell cancer. This surgery was done at [facility] and ultimately was determined that he did not have a cancer. 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 He also had a dissection of a left axillary lymph node at that same time which was necrotic and no definite malignancy found. He is been followed by PET scan and last had PET scan in August 2017, results of which are available in care everywhere. This showed mildly increased uptake throughout his bone marrow felt to be reactive and a superior mediastinal paratracheal node somewhat more prominent than on prior scans, was recommended to follow with serial scans. He is frustrated because he says he doesn't feel well, has lost a significant amount of weight and does not have an answer for his symptoms. He has been having intermittent jabbing pains in his upper abdomen, no diarrhea or melena. He has had some intermittent nausea as well. He apparently  had been on Suboxone in years past and started taking this again several months ago "because it makes me feel better". Most recent labs done 12/29/2015 CBC and CMET all unremarkable  Review of Systems Pertinent positive and negative review of systems were noted in the above HPI section.  All other review of systems was otherwise negative.  Outpatient Encounter Prescriptions as of 01/10/2016  Medication Sig  . acetaminophen (TYLENOL) 325 MG tablet Take 325 mg by mouth every 4 (four) hours as needed.  . ALPRAZolam (XANAX) 1 MG tablet Take 1 mg by mouth at bedtime as needed for anxiety.  . buprenorphine-naloxone (SUBOXONE) 8-2 MG SUBL SL tablet Place 1 tablet under the tongue 2 (two) times daily.  . citalopram (CELEXA) 40 MG tablet Take 1 tablet (40 mg total) by mouth daily.  . ondansetron (ZOFRAN) 4 MG tablet Take 1 tablet (4 mg total) by mouth every 6 (six) hours.  . [DISCONTINUED] ondansetron (ZOFRAN) 4 MG tablet Take 1 tablet (4 mg total) by mouth every 6 (six) hours.  . pantoprazole (PROTONIX) 40 MG tablet Take 1 tab every morning.  . [DISCONTINUED] amphetamine-dextroamphetamine (ADDERALL XR) 30 MG 24 hr capsule Take 30 mg by mouth daily.  . [DISCONTINUED] Ledipasvir-Sofosbuvir (HARVONI) 90-400 MG TABS Take by mouth.   No facility-administered encounter medications on file as of 01/10/2016.    Allergies  Allergen Reactions  . Ceclor [Cefaclor] Other (See Comments)    Does not recall, was a small  child    Patient Active Problem List   Diagnosis Date Noted  . Encounter for well adult exam with abnormal findings 12/20/2015  . Bruises easily 12/20/2015  . Squamous cell carcinoma ([place]) 05/19/2015  . Itching 05/19/2015  . Major depressive disorder, recurrent, severe without psychotic features ([place])   . Anxiety disorder 12/05/2014  . Major depressive disorder, recurrent episode, severe ([place]) 12/05/2014  . 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 Marital status: Single     Spouse name: N/A  . Number of children: 1  . Years of education: 75   Occupational History  . Mechanic     Social History Main Topics  . Smoking status: Former Smoker    Packs/day: 0.50    Years: 9.00    Types: Cigarettes  . Smokeless tobacco: Former Systems developer    Types: Chew    Quit date: 04/16/2008  . Alcohol use No  . Drug use: No     Comment: stopped marijuana in Aug 2017  . Sexual activity: Not on file   Other Topics Concern  . Not on file   Social History Narrative   Fun: Play guitar and hang out with his daughter    [name]'s family history includes Colon cancer in his paternal grandfather; Diabetes in his maternal grandmother; Hodgkin's lymphoma in his mother; Kidney cancer in his father; Lung cancer in his paternal grandmother; Melanoma in his maternal grandfather; Pancreatic cancer in his maternal grandmother.      Objective:    Vitals:   01/10/16 1407  BP: 140/80  Pulse: 84    Physical Exam  well-developed white male in no acute distress, pleasant blood pressure 140/80 pulse 84, BMI 22.2 weight 142 down 30 pounds over the past 10 months. HEENT ;nontraumatic normocephalic EOMI PERRLA sclera anicteric he is status post radical neck dissection, Cardiovascular; regular rate and rhythm with S1-S2 no murmur or gallop, Pulmonary; clear bilaterally, Abdomen ;soft, nontender nondistended bowel sounds are active there is no palpable mass or hepatosplenomegaly, Rectal ;exam not done, Extremities; no clubbing cyanosis or edema skin warm and dry, Neuropsych ;mood and affect appropriate       Assessment & Plan:   #60 35 year old white male with persistent fatigue and 30 pound weight loss. Etiology is not clear. This may be multifactorial and related to surgery in May followed by treatment for hepatitis C however patient's says that weight loss dates back to January 2017. There had been some concern mentioned about underlying lymphoma, most recent PET scan did show a more  prominent superior mediastinal paratracheal node. Patient has been having upper abdominal pain and some nausea. We will rule out peptic ulcer disease and gallbladder disease. #2 anxiety #3 history of depression #4 prior history of opioid dependence currently on Suboxone  Plan; Will schedule for upper endoscopy with Dr. Ardis Hughs. Procedure discussed in detail with patient including risks and benefits and he is agreeable to proceed Schedule for upper abdominal ultrasound Start Protonix 40 mg by mouth every morning Refill Zofran 4 mg every 6-8 hours when necessary for nausea If initial GI workup is unrevealing he will need to be referred back to [facility], consider oncology consultation   Dameshia Seybold Genia Harold PA-C 01/10/2016   Cc: [place], FNP

## 2016-01-11 LAB — MANUAL DIFFERENTIAL
Atypical Lymphocytes Manual: 0 %
Bands Manual: 0 % (ref 0–5)
Basophils Manual: 1 % (ref 0–1)
Blasts Manual: 0 %
Eosinophils Manual: 4 % (ref 0–5)
Lymphocytes Manual: 36 % (ref 12–46)
Metamyelocytes Manual: 0 %
Monocytes Manual: 5 % (ref 3–12)
Myelocytes Manual: 0 %
Neutrophils Manual: 54 % (ref 43–77)

## 2016-01-11 NOTE — Progress Notes (Signed)
I agree with the above note, plan 

## 2016-01-12 ENCOUNTER — Encounter: Payer: Self-pay | Admitting: Family

## 2016-01-16 ENCOUNTER — Ambulatory Visit (HOSPITAL_COMMUNITY)
Admission: RE | Admit: 2016-01-16 | Discharge: 2016-01-16 | Disposition: A | Discharge status: Home / Self Care | Payer: BLUE CROSS/BLUE SHIELD | Source: Ambulatory Visit | Attending: Physician Assistant | Admitting: Physician Assistant

## 2016-01-16 DIAGNOSIS — R634 Abnormal weight loss: Secondary | ICD-10-CM | POA: Insufficient documentation

## 2016-01-16 DIAGNOSIS — K219 Gastro-esophageal reflux disease without esophagitis: Secondary | ICD-10-CM | POA: Insufficient documentation

## 2016-01-16 DIAGNOSIS — R5383 Other fatigue: Secondary | ICD-10-CM | POA: Insufficient documentation

## 2016-01-16 DIAGNOSIS — R112 Nausea with vomiting, unspecified: Secondary | ICD-10-CM | POA: Insufficient documentation

## 2016-01-17 ENCOUNTER — Encounter: Payer: Self-pay | Admitting: Physician Assistant

## 2016-01-20 ENCOUNTER — Encounter: Payer: Self-pay | Admitting: Gastroenterology

## 2016-01-20 ENCOUNTER — Ambulatory Visit (AMBULATORY_SURGERY_CENTER): Payer: BLUE CROSS/BLUE SHIELD | Admitting: Gastroenterology

## 2016-01-20 VITALS — BP 112/68 | HR 61 | Temp 97.7°F | Resp 15 | Ht 67.0 in | Wt 142.0 lb

## 2016-01-20 DIAGNOSIS — K2990 Gastroduodenitis, unspecified, without bleeding: Secondary | ICD-10-CM

## 2016-01-20 DIAGNOSIS — K2970 Gastritis, unspecified, without bleeding: Secondary | ICD-10-CM

## 2016-01-20 DIAGNOSIS — R112 Nausea with vomiting, unspecified: Secondary | ICD-10-CM

## 2016-01-20 MED ORDER — SODIUM CHLORIDE 0.9 % IV SOLN: 500.0000 mL | INTRAVENOUS | Status: DC

## 2016-01-20 NOTE — Progress Notes (Signed)
Called to room to assist during endoscopic procedure.  Patient ID and intended procedure confirmed with present staff. Received instructions for my participation in the procedure from the performing physician.  

## 2016-01-20 NOTE — Patient Instructions (Signed)
YOU HAD AN ENDOSCOPIC PROCEDURE TODAY AT [facility]:   Refer to the procedure report that was given to you for any specific questions about what was found during the examination.  If the procedure report does not answer your questions, please call your gastroenterologist to clarify.  If you requested that your care partner not be given the details of your procedure findings, then the procedure report has been included in a sealed envelope for you to review at your convenience later.  YOU SHOULD EXPECT: Some feelings of bloating in the abdomen. Passage of more gas than usual.  Walking can help get rid of the air that was put into your GI tract during the procedure and reduce the bloating. If you had a lower endoscopy (such as a colonoscopy or flexible sigmoidoscopy) you may notice spotting of blood in your stool or on the toilet paper. If you underwent a bowel prep for your procedure, you may not have a normal bowel movement for a few days.  Please Note:  You might notice some irritation and congestion in your nose or some drainage.  This is from the oxygen used during your procedure.  There is no need for concern and it should clear up in a day or so.  SYMPTOMS TO REPORT IMMEDIATELY:     Following upper endoscopy (EGD)  Vomiting of blood or coffee ground material  New chest pain or pain under the shoulder blades  Painful or persistently difficult swallowing  New shortness of breath  Fever of 100F or higher  Black, tarry-looking stools  For urgent or emergent issues, a gastroenterologist can be reached at any hour by calling [phone].   DIET:  We do recommend a small meal at first, but then you may proceed to your regular diet.  Drink plenty of fluids but you should avoid alcoholic beverages for 24 hours.  ACTIVITY:  You should plan to take it easy for the rest of today and you should NOT DRIVE or use heavy machinery until tomorrow (because of the sedation medicines  used during the test).    FOLLOW UP: Our staff will call the number listed on your records the next business day following your procedure to check on you and address any questions or concerns that you may have regarding the information given to you following your procedure. If we do not reach you, we will leave a message.  However, if you are feeling well and you are not experiencing any problems, there is no need to return our call.  We will assume that you have returned to your regular daily activities without incident.  If any biopsies were taken you will be contacted by phone or by letter within the next 1-3 weeks.  Please call us at [phone] if you have not heard about the biopsies in 3 weeks.    SIGNATURES/CONFIDENTIALITY: You and/or your care partner have signed paperwork which will be entered into your electronic medical record.  These signatures attest to the fact that that the information above on your After Visit Summary has been reviewed and is understood.  Full responsibility of the confidentiality of this discharge information lies with you and/or your care-partner.   Information on gastritis given to you today  Await biopsy results

## 2016-01-20 NOTE — Op Note (Signed)
[facility] Patient Name: [name] Procedure Date: 01/20/2016 1:13 PM MRN: [id number] Endoscopist: Milus Banister , MD Age: 35 Referring MD:  Date of Birth: 07-19-1980 Gender: Male Account #: [id number] Procedure:                Upper GI endoscopy Indications:              Weight loss Medicines:                Monitored Anesthesia Care Procedure:                Pre-Anesthesia Assessment:                           - Prior to the procedure, a History and Physical                            was performed, and patient medications and                            allergies were reviewed. The patient's tolerance of                            previous anesthesia was also reviewed. The risks                            and benefits of the procedure and the sedation                            options and risks were discussed with the patient.                            All questions were answered, and informed consent                            was obtained. Prior Anticoagulants: The patient has                            taken no previous anticoagulant or antiplatelet                            agents. ASA Grade Assessment: II - A patient with                            mild systemic disease. After reviewing the risks                            and benefits, the patient was deemed in                            satisfactory condition to undergo the procedure.                           After obtaining informed consent, the endoscope was  passed under direct vision. Throughout the                            procedure, the patient's blood pressure, pulse, and                            oxygen saturations were monitored continuously. The                            Model GIF-[id number] scope was introduced                            through the mouth, and advanced to the second part                            of duodenum. The upper GI endoscopy was                             accomplished without difficulty. The patient                            tolerated the procedure well. Scope In: Scope Out: Findings:                 The esophagus was normal.                           Mild inflammation characterized by erythema and                            granularity was found in the gastric antrum.                            Biopsies were taken with a cold forceps for                            histology.                           The examined duodenum was normal. Complications:            No immediate complications. Estimated blood loss:                            None. Estimated Blood Loss:     Estimated blood loss: none. Impression:               - Normal esophagus.                           - Mild gastritis. Biopsied.                           - Normal examined duodenum.                           - No sign of cancer in the UGI tract. Recommendation:           -  Patient has a contact number available for                            emergencies. The signs and symptoms of potential                            delayed complications were discussed with the                            patient. Return to normal activities tomorrow.                            Written discharge instructions were provided to the                            patient.                           - Resume previous diet.                           - Continue present medications.                           - Await pathology results. Milus Banister, MD 01/20/2016 1:27:52 PM This report has been signed electronically.

## 2016-01-20 NOTE — Progress Notes (Signed)
To recovry, report to [employer], RN, VSS

## 2016-01-23 ENCOUNTER — Telehealth: Payer: Self-pay

## 2016-01-23 NOTE — Telephone Encounter (Signed)
  Follow up Call-  Call back number 01/20/2016  Post procedure Call Back phone  # [phone]  Permission to leave phone message Yes  Some recent data might be hidden     Patient questions:  Do you have a fever, pain , or abdominal swelling? No. Pain Score  0 *  Have you tolerated food without any problems? Yes.    Have you been able to return to your normal activities? Yes.    Do you have any questions about your discharge instructions: Diet   No. Medications  No. Follow up visit  No.  Do you have questions or concerns about your Care? No.  Actions: * If pain score is 4 or above: No action needed, pain <4.

## 2016-01-26 ENCOUNTER — Encounter: Payer: Self-pay | Admitting: Gastroenterology

## 2016-04-19 ENCOUNTER — Ambulatory Visit: Payer: BLUE CROSS/BLUE SHIELD | Admitting: Neurology

## 2016-04-19 ENCOUNTER — Telehealth: Payer: Self-pay

## 2016-04-19 NOTE — Telephone Encounter (Signed)
Pt no-showed his new patient appt this morning.

## 2016-04-24 ENCOUNTER — Encounter: Payer: Self-pay | Admitting: Neurology

## 2016-05-08 ENCOUNTER — Encounter: Payer: Self-pay | Admitting: Nurse Practitioner

## 2016-05-08 ENCOUNTER — Ambulatory Visit (INDEPENDENT_AMBULATORY_CARE_PROVIDER_SITE_OTHER): Payer: BLUE CROSS/BLUE SHIELD | Admitting: Nurse Practitioner

## 2016-05-08 VITALS — BP 144/90 | HR 107 | Temp 98.0°F | Ht 67.0 in | Wt 165.0 lb

## 2016-05-08 DIAGNOSIS — M25512 Pain in left shoulder: Secondary | ICD-10-CM | POA: Diagnosis not present

## 2016-05-08 DIAGNOSIS — M25511 Pain in right shoulder: Secondary | ICD-10-CM | POA: Diagnosis not present

## 2016-05-08 MED ORDER — PREDNISONE 5 MG (21) PO TBPK: 5.0000 mg | ORAL_TABLET | ORAL | 0 refills | Status: DC

## 2016-05-08 NOTE — Patient Instructions (Addendum)
Follow up with pcp if no improvement in 1week. Advised patient about possible adverse effects with excessive use of systemic corticosteriods.  Shoulder Pain Many things can cause shoulder pain, including:  An injury to the area.  Overuse of the shoulder.  Arthritis. The source of the pain can be:  Inflammation.  An injury to the shoulder joint.  An injury to a tendon, ligament, or bone. Follow these instructions at home: Take these actions to help with your pain:  Squeeze a soft ball or a foam pad as much as possible. This helps to keep the shoulder from swelling. It also helps to strengthen the arm.  Take over-the-counter and prescription medicines only as told by your health care provider.  If directed, apply ice to the area:  Put ice in a plastic bag.  Place a towel between your skin and the bag.  Leave the ice on for 20 minutes, 2-3 times per day. Stop applying ice if it does not help with the pain.  If you were given a shoulder sling or immobilizer:  Wear it as told.  Remove it to shower or bathe.  Move your arm as little as possible, but keep your hand moving to prevent swelling. Contact a health care provider if:  Your pain gets worse.  Your pain is not relieved with medicines.  New pain develops in your arm, hand, or fingers. Get help right away if:  Your arm, hand, or fingers:  Tingle.  Become numb.  Become swollen.  Become painful.  Turn white or blue. This information is not intended to replace advice given to you by your health care provider. Make sure you discuss any questions you have with your health care provider. Document Released: 01/10/2005 Document Revised: 11/27/2015 Document Reviewed: 07/26/2014 Elsevier Interactive Patient Education  2017 [employer].

## 2016-05-08 NOTE — Progress Notes (Signed)
Subjective:  Patient ID: [name], male    DOB: 1980-12-06  Age: 36 y.o. MRN: [id number]  CC: Shoulder Pain (shoulders pain on and off, req med for this. taking tylanol and ibuprofen. )   Shoulder Pain   The pain is present in the right shoulder and left shoulder. This is a recurrent problem. The current episode started more than 1 year ago. There has been no history of extremity trauma. The problem occurs intermittently. The problem has been waxing and waning. The quality of the pain is described as aching. Pertinent negatives include no fever, inability to bear weight, itching, joint locking, joint swelling, limited range of motion, numbness, stiffness or tingling. The symptoms are aggravated by activity. He has tried acetaminophen and NSAIDS for the symptoms. The treatment provided no relief. Family history does not include gout or rheumatoid arthritis. There is no history of diabetes, gout, osteoarthritis or rheumatoid arthritis.  he is requesting oral prednisone or IM depomedrol to manage joint pain. Works as a Dealer. Pain worst in morning.  Last took oral prednisone 60months ago per patient IM DepoMedrol last administered 1year ago per patient. steroid injection administered 86months ago to cervical spine.  Outpatient Medications Prior to Visit  Medication Sig Dispense Refill  . acetaminophen (TYLENOL) 325 MG tablet Take 325 mg by mouth every 4 (four) hours as needed.    . ALPRAZolam (XANAX) 1 MG tablet Take 1 mg by mouth at bedtime as needed for anxiety.    Marland Kitchen amphetamine-dextroamphetamine (ADDERALL) 30 MG tablet Take by mouth.    . buprenorphine-naloxone (SUBOXONE) 8-2 MG SUBL SL tablet Place 1 tablet under the tongue 2 (two) times daily.    Marland Kitchen ibuprofen (ADVIL,MOTRIN) 200 MG tablet Take 200 mg by mouth.    . nicotine polacrilex (NICORETTE) 2 MG gum Place 2 mg inside cheek.    . ondansetron (ZOFRAN) 4 MG tablet Take 1 tablet (4 mg total) by mouth every 6 (six) hours. 40  tablet 0  . citalopram (CELEXA) 40 MG tablet Take 1 tablet (40 mg total) by mouth daily. (Patient not taking: Reported on 05/08/2016) 30 tablet 0  . pantoprazole (PROTONIX) 40 MG tablet Take 1 tab every morning. (Patient not taking: Reported on 05/08/2016) 90 tablet 3   Facility-Administered Medications Prior to Visit  Medication Dose Route Frequency Provider Last Rate Last Dose  . 0.9 %  sodium chloride infusion  500 mL Intravenous Continuous Milus Banister, MD        ROS See HPI  Objective:  BP (!) 144/90   Pulse (!) 107   Temp 98 F (36.7 C)   Ht 5\' 7"  (1.702 m)   Wt 165 lb (74.8 kg)   SpO2 98%   BMI 25.84 kg/m   BP Readings from Last 3 Encounters:  05/08/16 (!) 144/90  01/20/16 112/68  01/10/16 140/80    Wt Readings from Last 3 Encounters:  05/08/16 165 lb (74.8 kg)  01/20/16 142 lb (64.4 kg)  01/10/16 142 lb (64.4 kg)    Physical Exam  Constitutional: He is oriented to person, place, and time. No distress.  Neck: Normal range of motion. Neck supple. No thyromegaly present.  Cardiovascular: Normal rate.   Pulmonary/Chest: Effort normal.  Musculoskeletal: Normal range of motion. He exhibits no edema or tenderness.       Right shoulder: Normal.       Left shoulder: Normal.       Right elbow: Normal.      Left elbow: Normal.  Right wrist: Normal.       Left wrist: Normal.       Cervical back: Normal.       Right upper arm: Normal.       Left upper arm: Normal.       Right forearm: Normal.       Left forearm: Normal.       Right hand: Normal.       Left hand: Normal.  Lymphadenopathy:    He has no cervical adenopathy.  Neurological: He is alert and oriented to person, place, and time. He has normal reflexes.  Vitals reviewed.   Lab Results  Component Value Date   WBC 9.9 01/10/2016   HGB 14.4 01/10/2016   HCT 41.4 01/10/2016   PLT 334.0 01/10/2016   GLUCOSE 98 01/10/2016   CHOL 125 01/10/2016   TRIG 66.0 01/10/2016   HDL 55.40 01/10/2016    LDLCALC 57 01/10/2016   ALT 15 01/10/2016   AST 23 01/10/2016   NA 140 01/10/2016   K 4.0 01/10/2016   CL 105 01/10/2016   CREATININE 0.79 01/10/2016   BUN 19 01/10/2016   CO2 28 01/10/2016   TSH 1.00 01/10/2016   INR 1.1 (H) 01/10/2016    US Abdomen Complete  Result Date: 01/16/2016 CLINICAL DATA:  Weight loss. Nausea and vomiting. Abdominal pain for 9 months. 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 EXAM: ABDOMEN ULTRASOUND COMPLETE COMPARISON:  PET-CT 05/20/2015 FINDINGS: Gallbladder: No gallstones or wall thickening visualized. No sonographic Murphy sign noted by sonographer. Common bile duct: Diameter: 0.4 cm. Liver: No focal lesion identified. Within normal limits in parenchymal echogenicity. IVC: No abnormality visualized. Pancreas: Visualized portion unremarkable. Spleen: Size and appearance within normal limits. Right Kidney: Length: 10.2 cm. Echogenicity within normal limits. No mass or hydronephrosis visualized. Left Kidney: Length: 10.1 cm. Echogenicity within normal limits. No mass or hydronephrosis visualized. Abdominal aorta: No aneurysm visualized. Other findings: None. IMPRESSION: Negative abdominal ultrasound. Electronically Signed   By: [name] M.D.   On: 01/16/2016 08:16    Assessment & Plan:   Damon was seen today for shoulder pain.  Diagnoses and all orders for this visit:  Pain of both shoulder joints -     predniSONE (STERAPRED UNI-PAK 21 TAB) 5 MG (21) TBPK tablet; Take 1 tablet (5 mg total) by mouth as directed.   I am having Mr. Young start on predniSONE. I am also having him maintain his citalopram, ALPRAZolam, acetaminophen, buprenorphine-naloxone, ondansetron, pantoprazole, amphetamine-dextroamphetamine, ibuprofen, nicotine polacrilex, VIIBRYD, ADDERALL XR, and SUBOXONE. We will continue to administer sodium chloride.  Meds ordered this encounter  Medications  . VIIBRYD 40 MG TABS    Sig: Take 40 mg by mouth 1 day or 1 dose.      Refill:  12  . ADDERALL XR 30 MG 24 hr capsule    Sig: Take 30 mg by mouth every morning.    Refill:  0  . SUBOXONE 8-2 MG FILM    Sig: Take 8 mg by mouth 2 (two) times daily at 10 AM and 5 PM.    Refill:  0  . predniSONE (STERAPRED UNI-PAK 21 TAB) 5 MG (21) TBPK tablet    Sig: Take 1 tablet (5 mg total) by mouth as directed.    Dispense:  21 tablet    Refill:  0    Order Specific Question:   Supervising Provider    Answer:   Cassandria Anger [[id number]]    Follow-up:  Return if symptoms worsen or fail to improve.  Wilfred Lacy, NP

## 2016-05-08 NOTE — Progress Notes (Signed)
Patient received education resource, including the self-management goal and tool. Patient verbalized understanding. 

## 2016-06-06 ENCOUNTER — Encounter (HOSPITAL_COMMUNITY): Payer: Self-pay | Admitting: *Deleted

## 2016-06-06 ENCOUNTER — Emergency Department (HOSPITAL_COMMUNITY): Payer: BLUE CROSS/BLUE SHIELD

## 2016-06-06 ENCOUNTER — Emergency Department (HOSPITAL_COMMUNITY)
Admission: EM | Admit: 2016-06-06 | Discharge: 2016-06-06 | Disposition: A | Discharge status: Home / Self Care | Payer: BLUE CROSS/BLUE SHIELD | Attending: Emergency Medicine | Admitting: Emergency Medicine

## 2016-06-06 DIAGNOSIS — R059 Cough, unspecified: Secondary | ICD-10-CM

## 2016-06-06 DIAGNOSIS — J069 Acute upper respiratory infection, unspecified: Secondary | ICD-10-CM | POA: Diagnosis not present

## 2016-06-06 DIAGNOSIS — R05 Cough: Secondary | ICD-10-CM

## 2016-06-06 DIAGNOSIS — F17210 Nicotine dependence, cigarettes, uncomplicated: Secondary | ICD-10-CM | POA: Insufficient documentation

## 2016-06-06 LAB — CBC WITH DIFFERENTIAL/PLATELET
Basophils Absolute: 0 10*3/uL (ref 0.0–0.1)
Basophils Relative: 0 %
EOS ABS: 0 10*3/uL (ref 0.0–0.7)
EOS PCT: 0 %
HCT: 37.4 % — ABNORMAL LOW (ref 39.0–52.0)
Hemoglobin: 13.5 g/dL (ref 13.0–17.0)
LYMPHS ABS: 1.7 10*3/uL (ref 0.7–4.0)
LYMPHS PCT: 14 %
MCH: 30.7 pg (ref 26.0–34.0)
MCHC: 36.1 g/dL — AB (ref 30.0–36.0)
MCV: 85 fL (ref 78.0–100.0)
MONO ABS: 1 10*3/uL (ref 0.1–1.0)
Monocytes Relative: 8 %
Neutro Abs: 10 10*3/uL — ABNORMAL HIGH (ref 1.7–7.7)
Neutrophils Relative %: 78 %
PLATELETS: 288 10*3/uL (ref 150–400)
RBC: 4.4 MIL/uL (ref 4.22–5.81)
RDW: 12.5 % (ref 11.5–15.5)
WBC: 12.9 10*3/uL — ABNORMAL HIGH (ref 4.0–10.5)

## 2016-06-06 LAB — I-STAT TROPONIN, ED: TROPONIN I, POC: 0.01 ng/mL (ref 0.00–0.08)

## 2016-06-06 LAB — COMPREHENSIVE METABOLIC PANEL
ALT: 19 U/L (ref 17–63)
ANION GAP: 12 (ref 5–15)
AST: 32 U/L (ref 15–41)
Albumin: 4.6 g/dL (ref 3.5–5.0)
Alkaline Phosphatase: 45 U/L (ref 38–126)
BUN: 14 mg/dL (ref 6–20)
CHLORIDE: 104 mmol/L (ref 101–111)
CO2: 20 mmol/L — ABNORMAL LOW (ref 22–32)
CREATININE: 0.86 mg/dL (ref 0.61–1.24)
Calcium: 9.3 mg/dL (ref 8.9–10.3)
Glucose, Bld: 109 mg/dL — ABNORMAL HIGH (ref 65–99)
Potassium: 3.2 mmol/L — ABNORMAL LOW (ref 3.5–5.1)
Sodium: 136 mmol/L (ref 135–145)
Total Bilirubin: 0.7 mg/dL (ref 0.3–1.2)
Total Protein: 7.5 g/dL (ref 6.5–8.1)

## 2016-06-06 MED ORDER — POTASSIUM CHLORIDE CRYS ER 20 MEQ PO TBCR
40.0000 meq | EXTENDED_RELEASE_TABLET | Freq: Once | ORAL | Status: AC
  Administered 2016-06-06: 40 meq via ORAL
  Filled 2016-06-06: qty 2

## 2016-06-06 MED ORDER — BENZONATATE 100 MG PO CAPS: 200.0000 mg | ORAL_CAPSULE | Freq: Two times a day (BID) | ORAL | 0 refills | Status: DC | PRN

## 2016-06-06 MED ORDER — IOPAMIDOL (ISOVUE-370) INJECTION 76%
INTRAVENOUS | Status: AC
  Administered 2016-06-06: 100 mL
  Filled 2016-06-06: qty 100

## 2016-06-06 NOTE — ED Triage Notes (Signed)
Pt c/o productive cough with blood tinged sputum per pt, pt reports onset x3 days, pt c/o gnerealized body aches, pt c/o mid CP with cough, pt diaphoresis, pt denies n/v/d, A&O x4

## 2016-06-06 NOTE — ED Provider Notes (Signed)
[facility] DEPT Provider Note   CSN: [id number] Arrival date & time: 06/06/16  1557     History   Chief Complaint Chief Complaint  Patient presents with  . Cough    HPI [name] is a 36 y.o. male.  HPI   Patient is a 36 year old male with history of anxiety, depression, hep C who presents to the ED with complaint of shortness of breath. Patient reports last night he began having chills and body aches and reports having a temperature of 102. He states when he woke up this morning he began having worsening shortness of breath, chest congestion, sore throat and shortness of breath. Patient reports having productive cough today and reports coughing up blood-tinged sputum with firm mucus, denies coughing up blood clots. Patient also states he has been having intermittent sharp pain throughout his chest that started this morning when he woke up. Patient reports pain is worse with movement, coughing or when taking a deep breath. He also reports having swelling to his left lower leg which was first present when he woke up this morning but notes has since resolved. Patient reports history of blood clots in his arm (2 years ago) and left leg (1 year ago). Denies current use of anticoagulants. Denies headache, lightheadedness, nasal congestion, rhinorrhea, wheezing, palpitations, abdominal pain, vomiting, diarrhea, urinary symptoms. Denies any recent long car ride or airplane travel, recent surgeries or hospitalizations, history of cancer. Patient notes he was previously evaluated at [facility] for neck mass which after having surgical excision, multiple biopsies and PET scans performed was shown to be negative for cancer. Patient endorses smoking 12 cigarettes daily. Denies any known sick contacts. Denies taking any medications prior to arrival.  Past Medical History:  Diagnosis Date  . Anemia   . Anxiety   . Arthritis   . Depression   . Hepatitis C   . Kidney stone   . Opiate dependence  [facility])     Patient Active Problem List   Diagnosis Date Noted  . Encounter for well adult exam with abnormal findings 12/20/2015  . Bruises easily 12/20/2015  . Squamous cell carcinoma 05/19/2015  . Itching 05/19/2015  . Major depressive disorder, recurrent, severe without psychotic features ([place])   . Anxiety disorder 12/05/2014  . Major depressive disorder, recurrent episode, severe ([place]) 12/05/2014  . 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 APPENDECTOMY    . left axillary node biopsy    . RADICAL NECK DISSECTION Left   . TONSILLECTOMY         Home Medications    Prior to Admission medications   Medication Sig Start Date End Date Taking? Authorizing Provider  acetaminophen (TYLENOL) 325 MG tablet Take 325 mg by mouth every 4 (four) hours as needed.   Yes Historical Provider, MD  ADDERALL XR 30 MG 24 hr capsule Take 30 mg by mouth every morning. 04/13/16  Yes Historical Provider, MD  ALPRAZolam Duanne Moron) 1 MG tablet Take 1 mg by mouth at bedtime as needed for anxiety.   Yes Historical Provider, MD  buprenorphine-naloxone (SUBOXONE) 8-2 MG SUBL SL tablet Place 1 tablet under the tongue 2 (two) times daily.   Yes Historical Provider, MD  ibuprofen (ADVIL,MOTRIN) 200 MG tablet Take 200 mg by mouth.   Yes Historical Provider, MD  nicotine polacrilex (NICORETTE) 2 MG gum Place 2 mg inside cheek. 12/01/15  Yes Historical Provider, MD  ondansetron (ZOFRAN) 4 MG tablet Take 1 tablet (4 mg total)  by mouth every 6 (six) hours. Patient taking differently: Take 4 mg by mouth every 8 (eight) hours as needed for nausea.  01/10/16  Yes Amy S Esterwood, PA-C  SUBOXONE 8-2 MG FILM Take 8 mg by mouth 2 (two) times daily at 10 AM and 5 PM. 04/02/16  Yes Historical Provider, MD  VIIBRYD 40 MG TABS Take 40 mg by mouth 1 day or 1 dose. 04/12/16  Yes Historical Provider, MD  benzonatate (TESSALON) 100 MG capsule Take 2 capsules (200 mg total) by mouth 2 (two) times  daily as needed for cough. 06/06/16   Nona Dell, PA-C  citalopram (CELEXA) 40 MG tablet Take 1 tablet (40 mg total) by mouth daily. Patient not taking: Reported on 05/08/2016 12/09/14   Kerrie Buffalo, NP  pantoprazole (PROTONIX) 40 MG tablet Take 1 tab every morning. Patient not taking: Reported on 05/08/2016 01/10/16   Amy S Esterwood, PA-C  predniSONE (STERAPRED UNI-PAK 21 TAB) 5 MG (21) TBPK tablet Take 1 tablet (5 mg total) by mouth as directed. Patient not taking: Reported on 06/06/2016 05/08/16   Flossie Buffy, NP    Family History Family History  Problem Relation Age of Onset  . Kidney cancer Father     renal cell carcenoma  . Hodgkin's lymphoma Mother   . Colon cancer Paternal Grandfather     great  . Lung cancer Paternal Grandmother   . Diabetes Maternal Grandmother   . Pancreatic cancer Maternal Grandmother   . Melanoma Maternal Grandfather     Social History Social History  Substance Use Topics  . Smoking status: Current Some Day Smoker    Packs/day: 0.50    Years: 9.00    Types: Cigarettes  . Smokeless tobacco: Former Systems developer    Types: Chew    Quit date: 04/16/2008  . Alcohol use No     Allergies   Ceclor [cefaclor]   Review of Systems Review of Systems  Constitutional: Positive for chills and fever.  HENT: Positive for sore throat.   Respiratory: Positive for cough and shortness of breath.   Cardiovascular: Positive for chest pain.  Musculoskeletal: Positive for myalgias (generalized).  All other systems reviewed and are negative.    Physical Exam Updated Vital Signs BP 136/95   Pulse 89   Temp 98.2 F (36.8 C) (Oral)   Resp 23   Ht 5\' 7"  (1.702 m)   Wt 72.6 kg   SpO2 100%   BMI 25.06 kg/m   Physical Exam  Constitutional: He is oriented to person, place, and time. He appears well-developed and well-nourished. No distress.  Pt appears anxious during exam.  HENT:  Head: Normocephalic and atraumatic.  Nose: Nose normal. Right  sinus exhibits no maxillary sinus tenderness and no frontal sinus tenderness. Left sinus exhibits no maxillary sinus tenderness and no frontal sinus tenderness.  Mouth/Throat: Uvula is midline, oropharynx is clear and moist and mucous membranes are normal. No oropharyngeal exudate, posterior oropharyngeal edema, posterior oropharyngeal erythema or tonsillar abscesses. No tonsillar exudate.  Eyes: Conjunctivae and EOM are normal. Pupils are equal, round, and reactive to light. Right eye exhibits no discharge. Left eye exhibits no discharge. No scleral icterus.  Neck: Normal range of motion. Neck supple.  Cardiovascular: Normal rate, regular rhythm, normal heart sounds and intact distal pulses.   Pulmonary/Chest: Effort normal and breath sounds normal. No respiratory distress. He has no wheezes. He has no rales. He exhibits no tenderness.  Abdominal: Soft. Bowel sounds are normal. He exhibits no  distension and no mass. There is no tenderness. There is no rebound and no guarding. No hernia.  Musculoskeletal: Normal range of motion. He exhibits no edema.  Lymphadenopathy:    He has no cervical adenopathy.  Neurological: He is alert and oriented to person, place, and time.  Skin: Skin is warm and dry. He is not diaphoretic.  Nursing note and vitals reviewed.    ED Treatments / Results  Labs (all labs ordered are listed, but only abnormal results are displayed) Labs Reviewed  CBC WITH DIFFERENTIAL/PLATELET - Abnormal; Notable for the following:       Result Value   WBC 12.9 (*)    HCT 37.4 (*)    MCHC 36.1 (*)    Neutro Abs 10.0 (*)    All other components within normal limits  COMPREHENSIVE METABOLIC PANEL - Abnormal; Notable for the following:    Potassium 3.2 (*)    CO2 20 (*)    Glucose, Bld 109 (*)    All other components within normal limits  I-STAT TROPOININ, ED    EKG  EKG Interpretation None       Radiology Dg Chest 2 View  Result Date: 06/06/2016 CLINICAL DATA:   Productive cough and blood-tinged sputum for 3 days. EXAM: CHEST  2 VIEW COMPARISON:  12/02/2015 FINDINGS: The cardiac silhouette, mediastinal and hilar contours are normal and stable. The lungs are clear. No pleural effusion. The bony thorax is intact. IMPRESSION: No acute cardiopulmonary findings. Electronically Signed   By: Marijo Sanes M.D.   On: 06/06/2016 17:02   Ct Angio Chest Pe W And/or Wo Contrast  Result Date: 06/06/2016 CLINICAL DATA:  Acute onset left chest pain and shortness of breath today. Previous lower extremity DVT. Head and neck squamous cell carcinoma. EXAM: CT ANGIOGRAPHY CHEST WITH CONTRAST TECHNIQUE: Multidetector CT imaging of the chest was performed using the standard protocol during bolus administration of intravenous contrast. Multiplanar CT image reconstructions and MIPs were obtained to evaluate the vascular anatomy. CONTRAST:  85 mL Isovue 370 COMPARISON:  None. FINDINGS: Cardiovascular: Satisfactory opacification of pulmonary arteries noted, and no pulmonary emboli identified. No evidence of thoracic aortic dissection or aneurysm. Mediastinum/Nodes: No masses or pathologically enlarged lymph nodes identified. Residual anterior mediastinal thymic tissue incidentally noted. Lungs/Pleura: No pulmonary mass, infiltrate, or effusion. Upper abdomen: No acute findings. Musculoskeletal: No suspicious bone lesions or other significant abnormality identified. Review of the MIP images confirms the above findings. IMPRESSION: Negative. No evidence of pulmonary embolism or other active disease. Electronically Signed   By: Earle Gell M.D.   On: 06/06/2016 19:22    Procedures Procedures (including critical care time)  Medications Ordered in ED Medications  potassium chloride SA (K-DUR,KLOR-CON) CR tablet 40 mEq (not administered)  iopamidol (ISOVUE-370) 76 % injection (100 mLs  Contrast Given 06/06/16 1858)     Initial Impression / Assessment and Plan / ED Course  I have reviewed  the triage vital signs and the nursing notes.  Pertinent labs & imaging results that were available during my care of the patient were reviewed by me and considered in my medical decision making (see chart for details).     Patient presents with worsening shortness of breath today with associated chest tightness, productive cough (blood-tinged sputum), sore throat, fever and chills. Also reports having intermittent chest pain that started this morning, worse with movement, coughing or taking a deep breath. Reports history of blood clot in his left arm and left leg previously, denies current use of  anticoagulants. Reports noting swelling to his left lower leg this morning which has since resolved. VSS. Exam unremarkable. Lungs clear to auscultation bilaterally. Patient reports his shortness of breath and associated symptoms have improved since arrival to the ED. EKG showed sinus rhythm with no acute ischemic changes. Trop negative. WBC 12.9. CXR negative. Discussed pt with Dr. Roderic Palau. Due to pt with hx of blood clots and reporting new onset SOB, will order CT angio chest for evaluation of PE. CTA negative. Suspect patient's symptoms are likely due to viral respiratory infection. Plan to discharge patient home with symptomatic treatment and be followed. Discussed results and plan for discharge with patient. On reevaluation  Final Clinical Impressions(s) / ED Diagnoses   Final diagnoses:  Cough  Acute upper respiratory infection    New Prescriptions New Prescriptions   BENZONATATE (TESSALON) 100 MG CAPSULE    Take 2 capsules (200 mg total) by mouth 2 (two) times daily as needed for cough.     Chesley Noon [place], [place] 06/06/16 1940    Milton Ferguson, MD 06/07/16 [phone]

## 2016-06-06 NOTE — Discharge Instructions (Signed)
Take your medication as prescribed. Continue to need fluids at home to remain hydrated. I recommend follow-up with your primary care provider in the next 4-5 days if your symptoms are not improved. Please return to the Emergency Department if symptoms worsen or new onset of fever, chest pain, difficulty breathing, coughing up blood/clots, vomiting, abdominal pain, leg swelling, syncope.

## 2016-06-06 NOTE — ED Notes (Addendum)
Pt reports hx of cancerous neck biopsies that later were negative, pt sees oncologist at [facility], pts acuity increased

## 2016-06-07 ENCOUNTER — Ambulatory Visit: Payer: BLUE CROSS/BLUE SHIELD | Admitting: Family

## 2016-07-08 ENCOUNTER — Encounter (HOSPITAL_COMMUNITY): Payer: Self-pay | Admitting: Emergency Medicine

## 2016-07-08 ENCOUNTER — Ambulatory Visit (HOSPITAL_COMMUNITY)
Admission: RE | Admit: 2016-07-08 | Discharge: 2016-07-08 | Disposition: A | Discharge status: Home / Self Care | Payer: BLUE CROSS/BLUE SHIELD | Source: Home / Self Care | Attending: Psychiatry | Admitting: Psychiatry

## 2016-07-08 ENCOUNTER — Emergency Department (HOSPITAL_COMMUNITY)
Admission: EM | Admit: 2016-07-08 | Discharge: 2016-07-09 | Disposition: A | Discharge status: Other Acute Inpatient Hospital | Payer: BLUE CROSS/BLUE SHIELD | Attending: Emergency Medicine | Admitting: Emergency Medicine

## 2016-07-08 DIAGNOSIS — Z79899 Other long term (current) drug therapy: Secondary | ICD-10-CM | POA: Insufficient documentation

## 2016-07-08 DIAGNOSIS — F1510 Other stimulant abuse, uncomplicated: Secondary | ICD-10-CM | POA: Insufficient documentation

## 2016-07-08 DIAGNOSIS — R208 Other disturbances of skin sensation: Secondary | ICD-10-CM | POA: Diagnosis present

## 2016-07-08 DIAGNOSIS — F1910 Other psychoactive substance abuse, uncomplicated: Secondary | ICD-10-CM

## 2016-07-08 DIAGNOSIS — F1920 Other psychoactive substance dependence, uncomplicated: Secondary | ICD-10-CM | POA: Diagnosis present

## 2016-07-08 DIAGNOSIS — F17210 Nicotine dependence, cigarettes, uncomplicated: Secondary | ICD-10-CM | POA: Insufficient documentation

## 2016-07-08 DIAGNOSIS — F12159 Cannabis abuse with psychotic disorder, unspecified: Secondary | ICD-10-CM | POA: Insufficient documentation

## 2016-07-08 DIAGNOSIS — F332 Major depressive disorder, recurrent severe without psychotic features: Secondary | ICD-10-CM | POA: Diagnosis not present

## 2016-07-08 LAB — COMPREHENSIVE METABOLIC PANEL
ALBUMIN: 4.2 g/dL (ref 3.5–5.0)
ALK PHOS: 48 U/L (ref 38–126)
ALT: 18 U/L (ref 17–63)
AST: 22 U/L (ref 15–41)
Anion gap: 7 (ref 5–15)
BILIRUBIN TOTAL: 0.7 mg/dL (ref 0.3–1.2)
BUN: 16 mg/dL (ref 6–20)
CO2: 29 mmol/L (ref 22–32)
Calcium: 9.2 mg/dL (ref 8.9–10.3)
Chloride: 104 mmol/L (ref 101–111)
Creatinine, Ser: 0.96 mg/dL (ref 0.61–1.24)
GFR calc Af Amer: >60 mL/min (ref >60)
GFR calc non Af Amer: >60 mL/min (ref >60)
GLUCOSE: 96 mg/dL (ref 65–99)
Potassium: 3.9 mmol/L (ref 3.5–5.1)
Sodium: 140 mmol/L (ref 135–145)
TOTAL PROTEIN: 7.6 g/dL (ref 6.5–8.1)

## 2016-07-08 LAB — CBC
HCT: 40.7 % (ref 39.0–52.0)
Hemoglobin: 14 g/dL (ref 13.0–17.0)
MCH: 30 pg (ref 26.0–34.0)
MCHC: 34.4 g/dL (ref 30.0–36.0)
MCV: 87.2 fL (ref 78.0–100.0)
Platelets: 335 10*3/uL (ref 150–400)
RBC: 4.67 MIL/uL (ref 4.22–5.81)
RDW: 13.2 % (ref 11.5–15.5)
WBC: 7.5 10*3/uL (ref 4.0–10.5)

## 2016-07-08 LAB — RAPID URINE DRUG SCREEN, HOSP PERFORMED
Amphetamines: POSITIVE — AB
BARBITURATES: NOT DETECTED
Benzodiazepines: NOT DETECTED
Cocaine: NOT DETECTED
OPIATES: NOT DETECTED
Tetrahydrocannabinol: POSITIVE — AB

## 2016-07-08 LAB — ETHANOL: Alcohol, Ethyl (B): <5 mg/dL (ref <5)

## 2016-07-08 MED ORDER — ONDANSETRON 4 MG PO TBDP: 4.0000 mg | ORAL_TABLET | Freq: Four times a day (QID) | ORAL | Status: DC | PRN

## 2016-07-08 MED ORDER — LORAZEPAM 1 MG PO TABS: 1.0000 mg | ORAL_TABLET | Freq: Three times a day (TID) | ORAL | Status: DC | PRN

## 2016-07-08 MED ORDER — NAPROXEN 500 MG PO TABS: 500.0000 mg | ORAL_TABLET | Freq: Two times a day (BID) | ORAL | Status: DC | PRN

## 2016-07-08 MED ORDER — BUPRENORPHINE HCL 8 MG SL SUBL
8.0000 mg | SUBLINGUAL_TABLET | Freq: Two times a day (BID) | SUBLINGUAL | Status: DC
  Administered 2016-07-08: 8 mg via SUBLINGUAL
  Filled 2016-07-08: qty 1

## 2016-07-08 MED ORDER — HYDROXYZINE HCL 25 MG PO TABS: 25.0000 mg | ORAL_TABLET | Freq: Four times a day (QID) | ORAL | Status: DC | PRN

## 2016-07-08 MED ORDER — LOPERAMIDE HCL 2 MG PO CAPS: 2.0000 mg | ORAL_CAPSULE | ORAL | Status: DC | PRN

## 2016-07-08 MED ORDER — DICYCLOMINE HCL 20 MG PO TABS: 20.0000 mg | ORAL_TABLET | Freq: Four times a day (QID) | ORAL | Status: DC | PRN

## 2016-07-08 MED ORDER — CLONIDINE HCL 0.1 MG PO TABS: 0.1000 mg | ORAL_TABLET | Freq: Every day | ORAL | Status: DC

## 2016-07-08 MED ORDER — SULFAMETHOXAZOLE-TRIMETHOPRIM 800-160 MG PO TABS
1.0000 | ORAL_TABLET | Freq: Two times a day (BID) | ORAL | Status: DC
  Administered 2016-07-08 – 2016-07-09 (×3): 1 via ORAL
  Filled 2016-07-08 (×2): qty 1

## 2016-07-08 MED ORDER — NICOTINE 21 MG/24HR TD PT24: 21.0000 mg | MEDICATED_PATCH | Freq: Every day | TRANSDERMAL | Status: DC

## 2016-07-08 MED ORDER — SULFAMETHOXAZOLE-TRIMETHOPRIM 800-160 MG PO TABS
1.0000 | ORAL_TABLET | Freq: Once | ORAL | Status: DC
  Filled 2016-07-08: qty 1

## 2016-07-08 MED ORDER — ZOLPIDEM TARTRATE 5 MG PO TABS: 5.0000 mg | ORAL_TABLET | Freq: Every evening | ORAL | Status: DC | PRN

## 2016-07-08 MED ORDER — CLONIDINE HCL 0.1 MG PO TABS: 0.1000 mg | ORAL_TABLET | ORAL | Status: DC

## 2016-07-08 MED ORDER — AMPHETAMINE-DEXTROAMPHET ER 10 MG PO CP24
30.0000 mg | ORAL_CAPSULE | Freq: Every morning | ORAL | Status: DC
  Administered 2016-07-09: 30 mg via ORAL
  Filled 2016-07-08: qty 3

## 2016-07-08 MED ORDER — ALPRAZOLAM 1 MG PO TABS: 1.0000 mg | ORAL_TABLET | Freq: Every evening | ORAL | Status: DC | PRN

## 2016-07-08 MED ORDER — VILAZODONE HCL 40 MG PO TABS
40.0000 mg | ORAL_TABLET | Freq: Every day | ORAL | Status: DC
  Administered 2016-07-08 – 2016-07-09 (×2): 40 mg via ORAL
  Filled 2016-07-08 (×2): qty 1

## 2016-07-08 MED ORDER — CLONIDINE HCL 0.1 MG PO TABS
0.1000 mg | ORAL_TABLET | Freq: Four times a day (QID) | ORAL | Status: DC
  Administered 2016-07-08 – 2016-07-09 (×4): 0.1 mg via ORAL
  Filled 2016-07-08 (×4): qty 1

## 2016-07-08 MED ORDER — GABAPENTIN 300 MG PO CAPS
300.0000 mg | ORAL_CAPSULE | Freq: Three times a day (TID) | ORAL | Status: DC
  Administered 2016-07-08 – 2016-07-09 (×3): 300 mg via ORAL
  Filled 2016-07-08 (×3): qty 1

## 2016-07-08 MED ORDER — METHOCARBAMOL 500 MG PO TABS: 500.0000 mg | ORAL_TABLET | Freq: Three times a day (TID) | ORAL | Status: DC | PRN

## 2016-07-08 NOTE — ED Notes (Signed)
Hourly rounding reveals patient in room. No complaints, stable, in no acute distress. Q15 minute rounds and monitoring via Security Cameras to continue. 

## 2016-07-08 NOTE — ED Triage Notes (Signed)
Pt reports relapsed on using meth. sts went to [facility] for detox yet asked to come to ED for medical clearance. Pt reports possible skin infection right hand and face.  Denies SI nor HI

## 2016-07-08 NOTE — H&P (Signed)
Behavioral Health Medical Screening Exam  [name] is an 36 y.o. male.  Total Time spent with patient: 15 minutes  Psychiatric Specialty Exam: Physical Exam  Constitutional: He is oriented to person, place, and time. He appears well-developed and well-nourished.  HENT:  Head: Normocephalic and atraumatic.  Eyes: Conjunctivae are normal.  Neck: Normal range of motion.  Cardiovascular: Normal rate, regular rhythm, normal heart sounds and intact distal pulses.   Respiratory: Effort normal and breath sounds normal.  GI: Soft. Bowel sounds are normal.  Musculoskeletal: Normal range of motion. He exhibits edema (mild edema to anterior medial forearm, ulnar region).  Neurological: He is alert and oriented to person, place, and time.  Skin: Skin is warm and dry. Rash (mild erythema to anterior medial forearm proximal to ulnar region at injection site from heroin) noted.  Flushing of face, pt reports this comes and goes.     Review of Systems  Skin: Positive for rash.  Psychiatric/Behavioral: Positive for depression, substance abuse and suicidal ideas (cannot contract for safety). The patient is nervous/anxious and has insomnia.   All other systems reviewed and are negative.   There were no vitals taken for this visit.There is no height or weight on file to calculate BMI.  General Appearance: Casual and Fairly Groomed  Eye Contact:  Fair  Speech:  Clear and Coherent and Normal Rate  Volume:  Increased  Mood:  Anxious  Affect:  Appropriate, Congruent and Depressed  Thought Process:  Coherent, Goal Directed, Linear and Descriptions of Associations: Intact  Orientation:  Full (Time, Place, and Person)  Thought Content:  Focused on inpatient  Suicidal Thoughts:  Yes.  with intent/plan  Homicidal Thoughts:  No  Memory:  Immediate;   Fair Recent;   Fair Remote;   Fair  Judgement:  Fair  Insight:  Fair  Psychomotor Activity:  Increased  Concentration: Concentration: Fair  and Attention Span: Fair  Recall:  [employer] of Knowledge:Fair  Language: Fair  Akathisia:  No  Handed:    AIMS (if indicated):     Assets:  Communication Skills Desire for Improvement Resilience Social Support  Sleep:       Musculoskeletal: Strength & Muscle Tone: within normal limits Gait & Station: normal Patient leans: N/A  Recommendations:  Based on my evaluation the patient appears to have an emergency medical condition for which I recommend the patient be transferred to the emergency department for further evaluation.   Pt reports intermittent facial flushing and facial edema upon waking this morning along with edema in his left anterior forearm surrounding the injection site from heroin. It is somewhat warm to the touch and mildly erythematous. Pt also reports being hot with sweats/chills last night. He has been taking his Suboxone 8/2 so this is unlikely a symptom of opiate withdrawal and may be early prodrome for blood infection. Please consider doing blood cultures.   Benjamine Mola, FNP 07/08/2016, 3:08 PM  Notes reviewed and agree with plan

## 2016-07-08 NOTE — ED Notes (Signed)
Hourly rounding reveals patient sleeping in room. No complaints, stable, in no acute distress. Q15 minute rounds and monitoring via Security Cameras to continue. 

## 2016-07-08 NOTE — BH Assessment (Signed)
Per Catalina Pizza, DNP - patient meets criteria for inpatient hospitalization. Writer informed the Charge Nurse Manuela Schwartz) that the patient will be coming to the ED.

## 2016-07-08 NOTE — BH Assessment (Addendum)
Assessment Note  [name] is a walk in that came to [facility] alone.  Patient is an 36 y.o. male that reports passive suicidal ideation.  Patient reports that if he does not get help then he will die of an overdose.   Patient denies HI/Psychosis.  Patient reports that he has been on a drug binge for a month.  Patient reports that he has been abusing meth and cocaine.  Patient reports that he has been receiving suboxone from Dr. Toy Care.  Patient reports that Dr. Toy Care also prescribes his medication for depression and anxiety as well.    Patient reports a past history of psychiatric inpatient hospitalization and substance abuse treatment.  Patient reports that he recently relapsed.after being clean for several years.  Patient reports that he lives with his girlfriend and their 35 year old child.  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   Per Catalina Pizza, DNP - patient meets criteria for inpatient hospitalization.     Diagnosis: Major Depressive Disorder, Severe, without psychosis Opiate dependence  Past Medical History:  Past Medical History:  Diagnosis Date  . Anemia   . Anxiety   . Arthritis   . Depression   . Hepatitis C   . Kidney stone   . Opiate dependence ([place])     Past Surgical History:  Procedure Laterality Date  . APPENDECTOMY    . left axillary node biopsy    . RADICAL NECK DISSECTION Left   . TONSILLECTOMY      Family History:  Family History  Problem Relation Age of Onset  . Kidney cancer Father     renal cell carcenoma  . Hodgkin's lymphoma Mother   . Colon cancer Paternal Grandfather     great  . Lung cancer Paternal Grandmother   . Diabetes Maternal Grandmother   . Pancreatic cancer Maternal Grandmother   . Melanoma Maternal Grandfather     Social History:  reports that he has been smoking Cigarettes.  He has a 4.50 pack-year smoking history. He quit smokeless tobacco use about 8 years ago. His smokeless tobacco use  included Chew. He reports that he does not drink alcohol or use drugs.  Additional Social History:  Alcohol / Drug Use History of alcohol / drug use?: Yes Longest period of sobriety (when/how long): Couple of years Negative Consequences of Use: Work / Youth worker, Museum/gallery curator, Personal relationships Withdrawal Symptoms:  (None Reported) Substance #1 Name of Substance 1: Meth  1 - Age of First Use: 16 1 - Amount (size/oz): Varies 1 - Frequency: Daily 1 - Duration: Since the age of 89 1 - Last Use / Amount: Last night - Unable to remember how much he used.  Substance #2 Name of Substance 2: Cocaine  2 - Age of First Use: 36yo 2 - Amount (size/oz): Varies  2 - Frequency: Daily 2 - Duration: Since the age of 36yo 2 - Last Use / Amount: Last night - Unable to remember how much he used.   CIWA:   COWS:    Allergies:  Allergies  Allergen Reactions  . Ceclor [Cefaclor] Other (See Comments)    Does not recall, was a small child     Home Medications:  (Not in a hospital admission)  OB/GYN Status:  No LMP for male patient.  General Assessment Data Location of Assessment: [facility] ([facility] ) TTS Assessment: In system Is this a Tele or Face-to-Face Assessment?: Face-to-Face Is this an  Initial Assessment or a Re-assessment for this encounter?: Initial Assessment Marital status: Single Maiden name: NA Is patient pregnant?: No Pregnancy Status: No Living Arrangements: Spouse/significant other (Lives with girlfriend and daughter) Can pt return to current living arrangement?: Yes Admission Status: Voluntary Is patient capable of signing voluntary admission?: Yes Referral Source: Self/Family/Friend Insurance type: Systems developer Exam ([place]) Medical Exam completed: Yes Catalina Pizza, DNP - completed MSE Exam.)  Crisis Care Plan Living Arrangements: Spouse/significant other (Lives with girlfriend and daughter) Legal Guardian:  (NA) Name of  Psychiatrist: Emeline Darling Name of Therapist: None Reported  Education Status Is patient currently in school?: No Current Grade: NA Highest grade of school patient has completed: NA Name of school: NA Contact person: NA  Risk to self with the past 6 months Suicidal Ideation: Yes-Currently Present Has patient been a risk to self within the past 6 months prior to admission? : No Suicidal Intent: Yes-Currently Present Has patient had any suicidal intent within the past 6 months prior to admission? : No Is patient at risk for suicide?: Yes Suicidal Plan?: Yes-Currently Present Has patient had any suicidal plan within the past 6 months prior to admission? : Yes Specify Current Suicidal Plan: Overdosing on drugs Access to Means: Yes Specify Access to Suicidal Means: Drugs What has been your use of drugs/alcohol within the last 12 months?: Meth and Cocaine  Previous Attempts/Gestures: No How many times?: 0 Other Self Harm Risks: None Reported Triggers for Past Attempts: Other (Comment) (Unable to stop using drugs.) Intentional Self Injurious Behavior: None Family Suicide History: No Recent stressful life event(s): Other (Comment), Financial Problems (Unable to stop using drugs; ) Persecutory voices/beliefs?: No Depression: Yes Depression Symptoms: Despondent, Insomnia, Tearfulness, Isolating, Fatigue, Guilt, Loss of interest in usual pleasures, Feeling worthless/self pity Substance abuse history and/or treatment for substance abuse?: Yes Suicide prevention information given to non-admitted patients: Yes  Risk to Others within the past 6 months Homicidal Ideation: No Does patient have any lifetime risk of violence toward others beyond the six months prior to admission? : No Thoughts of Harm to Others: No Current Homicidal Intent: No Current Homicidal Plan: No Access to Homicidal Means: No Identified Victim: None Reported History of harm to others?: No Assessment of Violence: None  Noted Violent Behavior Description: None Reported Does patient have access to weapons?: No Criminal Charges Pending?: No Does patient have a court date: No  Psychosis Hallucinations: None noted Delusions: None noted  Mental Status Report Appearance/Hygiene: Disheveled Eye Contact: Fair Motor Activity: Freedom of movement, Restlessness Speech: Logical/coherent Level of Consciousness: Alert Mood: Depressed Affect: Apathetic, Depressed, Blunted Anxiety Level: Minimal Thought Processes: Relevant, Coherent Judgement: Impaired Orientation: Person, Place, Time, Situation Obsessive Compulsive Thoughts/Behaviors: None  Cognitive Functioning Concentration: Decreased Memory: Recent Intact, Remote Intact IQ: Average Insight: Fair Impulse Control: Poor Appetite: Poor Weight Loss: 10 Weight Gain: 0 Sleep: Decreased Total Hours of Sleep: 3 Vegetative Symptoms: Not bathing, Staying in bed, Decreased grooming  ADLScreening [facility] Assessment Services) Patient's cognitive ability adequate to safely complete daily activities?: Yes Patient able to express need for assistance with ADLs?: Yes Independently performs ADLs?: Yes (appropriate for developmental age)  Prior Inpatient Therapy Prior Inpatient Therapy: Yes Prior Therapy Dates: 2015 Prior Therapy Facilty/Provider(s): [facility] Reason for Treatment: SA and SI  Prior Outpatient Therapy Prior Outpatient Therapy: Yes Prior Therapy Dates: Ongoing Prior Therapy Facilty/Provider(s): Dr. Toy Care Reason for Treatment: Medication Mgt and Suboxone Does patient have an ACCT team?: No Does patient have Intensive In-House  Services?  : No Does patient have [facility] services? : No Does patient have P4CC services?: No  ADL Screening (condition at time of admission) Patient's cognitive ability adequate to safely complete daily activities?: Yes Is the patient deaf or have difficulty hearing?: No Does the patient have difficulty seeing, even when  wearing glasses/contacts?: No Does the patient have difficulty concentrating, remembering, or making decisions?: Yes Patient able to express need for assistance with ADLs?: Yes Does the patient have difficulty dressing or bathing?: No Independently performs ADLs?: Yes (appropriate for developmental age) Does the patient have difficulty walking or climbing stairs?: No Weakness of Legs: None Weakness of Arms/Hands: None  Home Assistive Devices/Equipment Home Assistive Devices/Equipment: None    Abuse/Neglect Assessment (Assessment to be complete while patient is alone) Physical Abuse: Denies Verbal Abuse: Denies Sexual Abuse: Denies Exploitation of patient/patient's resources: Denies Self-Neglect: Denies Values / Beliefs Cultural Requests During Hospitalization: None Spiritual Requests During Hospitalization: None Consults Spiritual Care Consult Needed: No Social Work Consult Needed: No Regulatory affairs officer (For Healthcare) Does Patient Have a Medical Advance Directive?: No Would patient like information on creating a medical advance directive?: No - Patient declined    Additional Information 1:1 In Past 12 Months?: No CIRT Risk: No Elopement Risk: No Does patient have medical clearance?: Yes     Disposition: Per Catalina Pizza, DNP - patient meets criteria for inpatient hospitalization.  TTS will seek placement.  Disposition Initial Assessment Completed for this Encounter: Yes Disposition of Patient: Inpatient treatment program  On Site Evaluation by:   Reviewed with Physician:    Graciella Freer [place] 07/08/2016 1:36 PM

## 2016-07-08 NOTE — ED Notes (Signed)
Introduced self to patient. Pt oriented to unit expectations.  Assessed pt for:  A) Anxiety &/or agitation: On admission to the [facility] pt is depressed, but very friendly and cooperative. He said that he has been having suicidal thoughts. He lives in his car.   S) Safety: Safety maintained with q-15-minute checks and hourly rounds by staff.  A) ADLs: Pt able to perform ADLs independently.  P) Pick-Up (room cleanliness): Pt's room clean and free of clutter.

## 2016-07-08 NOTE — ED Notes (Signed)
Report to include situation, background, assessment and recommendations from Diane RN. Patient sleeping, respirations regular and unlabored. Q15 minute rounds and security camera observation to continue.   

## 2016-07-09 ENCOUNTER — Encounter (HOSPITAL_COMMUNITY): Payer: Self-pay | Admitting: *Deleted

## 2016-07-09 ENCOUNTER — Inpatient Hospital Stay (HOSPITAL_COMMUNITY)
Admission: AD | Admit: 2016-07-09 | Discharge: 2016-07-14 | DRG: 885 | Disposition: A | Discharge status: Home / Self Care | Payer: BLUE CROSS/BLUE SHIELD | Source: Intra-hospital | Attending: Psychiatry | Admitting: Psychiatry

## 2016-07-09 DIAGNOSIS — F1520 Other stimulant dependence, uncomplicated: Secondary | ICD-10-CM | POA: Diagnosis present

## 2016-07-09 DIAGNOSIS — R51 Headache: Secondary | ICD-10-CM | POA: Diagnosis present

## 2016-07-09 DIAGNOSIS — F1510 Other stimulant abuse, uncomplicated: Secondary | ICD-10-CM | POA: Diagnosis not present

## 2016-07-09 DIAGNOSIS — B1920 Unspecified viral hepatitis C without hepatic coma: Secondary | ICD-10-CM | POA: Diagnosis present

## 2016-07-09 DIAGNOSIS — Z85828 Personal history of other malignant neoplasm of skin: Secondary | ICD-10-CM

## 2016-07-09 DIAGNOSIS — Z791 Long term (current) use of non-steroidal anti-inflammatories (NSAID): Secondary | ICD-10-CM | POA: Diagnosis not present

## 2016-07-09 DIAGNOSIS — F419 Anxiety disorder, unspecified: Secondary | ICD-10-CM | POA: Diagnosis present

## 2016-07-09 DIAGNOSIS — G47419 Narcolepsy without cataplexy: Secondary | ICD-10-CM | POA: Diagnosis present

## 2016-07-09 DIAGNOSIS — R45851 Suicidal ideations: Secondary | ICD-10-CM | POA: Diagnosis present

## 2016-07-09 DIAGNOSIS — F17210 Nicotine dependence, cigarettes, uncomplicated: Secondary | ICD-10-CM | POA: Diagnosis present

## 2016-07-09 DIAGNOSIS — F19129 Other psychoactive substance abuse with intoxication, unspecified: Secondary | ICD-10-CM | POA: Diagnosis present

## 2016-07-09 DIAGNOSIS — F332 Major depressive disorder, recurrent severe without psychotic features: Secondary | ICD-10-CM

## 2016-07-09 DIAGNOSIS — Z881 Allergy status to other antibiotic agents status: Secondary | ICD-10-CM

## 2016-07-09 DIAGNOSIS — F1920 Other psychoactive substance dependence, uncomplicated: Secondary | ICD-10-CM | POA: Diagnosis present

## 2016-07-09 DIAGNOSIS — Z79899 Other long term (current) drug therapy: Secondary | ICD-10-CM | POA: Diagnosis not present

## 2016-07-09 DIAGNOSIS — F1420 Cocaine dependence, uncomplicated: Secondary | ICD-10-CM | POA: Diagnosis present

## 2016-07-09 DIAGNOSIS — G4700 Insomnia, unspecified: Secondary | ICD-10-CM | POA: Diagnosis present

## 2016-07-09 DIAGNOSIS — Z833 Family history of diabetes mellitus: Secondary | ICD-10-CM

## 2016-07-09 DIAGNOSIS — F1914 Other psychoactive substance abuse with psychoactive substance-induced mood disorder: Secondary | ICD-10-CM | POA: Diagnosis present

## 2016-07-09 DIAGNOSIS — Z808 Family history of malignant neoplasm of other organs or systems: Secondary | ICD-10-CM | POA: Diagnosis not present

## 2016-07-09 DIAGNOSIS — Z87442 Personal history of urinary calculi: Secondary | ICD-10-CM

## 2016-07-09 DIAGNOSIS — F1123 Opioid dependence with withdrawal: Secondary | ICD-10-CM | POA: Diagnosis present

## 2016-07-09 DIAGNOSIS — F329 Major depressive disorder, single episode, unspecified: Secondary | ICD-10-CM | POA: Diagnosis not present

## 2016-07-09 MED ORDER — NAPROXEN 500 MG PO TABS
500.0000 mg | ORAL_TABLET | Freq: Two times a day (BID) | ORAL | Status: AC | PRN
  Administered 2016-07-11 – 2016-07-13 (×5): 500 mg via ORAL
  Filled 2016-07-09 (×6): qty 1

## 2016-07-09 MED ORDER — VILAZODONE HCL 40 MG PO TABS
40.0000 mg | ORAL_TABLET | Freq: Every day | ORAL | Status: DC
  Administered 2016-07-10 – 2016-07-13 (×5): 40 mg via ORAL
  Filled 2016-07-09 (×6): qty 1

## 2016-07-09 MED ORDER — ONDANSETRON 4 MG PO TBDP
4.0000 mg | ORAL_TABLET | Freq: Four times a day (QID) | ORAL | Status: AC | PRN
  Administered 2016-07-11 – 2016-07-13 (×3): 4 mg via ORAL
  Filled 2016-07-09 (×3): qty 1

## 2016-07-09 MED ORDER — GABAPENTIN 300 MG PO CAPS
600.0000 mg | ORAL_CAPSULE | Freq: Three times a day (TID) | ORAL | Status: DC
  Administered 2016-07-10 – 2016-07-14 (×12): 600 mg via ORAL
  Filled 2016-07-09 (×20): qty 2

## 2016-07-09 MED ORDER — DICYCLOMINE HCL 20 MG PO TABS
20.0000 mg | ORAL_TABLET | Freq: Four times a day (QID) | ORAL | Status: AC | PRN
  Administered 2016-07-11: 20 mg via ORAL
  Filled 2016-07-09: qty 1

## 2016-07-09 MED ORDER — CLONIDINE HCL 0.1 MG PO TABS
0.1000 mg | ORAL_TABLET | Freq: Four times a day (QID) | ORAL | Status: AC
  Administered 2016-07-09 – 2016-07-10 (×3): 0.1 mg via ORAL
  Filled 2016-07-09 (×4): qty 1

## 2016-07-09 MED ORDER — LOPERAMIDE HCL 2 MG PO CAPS: 2.0000 mg | ORAL_CAPSULE | ORAL | Status: AC | PRN

## 2016-07-09 MED ORDER — TRAZODONE HCL 100 MG PO TABS: 100.0000 mg | ORAL_TABLET | Freq: Every evening | ORAL | Status: DC | PRN

## 2016-07-09 MED ORDER — ACETAMINOPHEN 325 MG PO TABS
650.0000 mg | ORAL_TABLET | Freq: Four times a day (QID) | ORAL | Status: DC | PRN
  Administered 2016-07-13 – 2016-07-14 (×2): 650 mg via ORAL
  Filled 2016-07-09 (×2): qty 2

## 2016-07-09 MED ORDER — MAGNESIUM HYDROXIDE 400 MG/5ML PO SUSP
30.0000 mL | Freq: Every day | ORAL | Status: DC | PRN
Start: 2016-07-09 — End: 2016-07-14

## 2016-07-09 MED ORDER — CLONIDINE HCL 0.1 MG PO TABS
0.1000 mg | ORAL_TABLET | ORAL | Status: AC
  Administered 2016-07-10 – 2016-07-12 (×4): 0.1 mg via ORAL
  Filled 2016-07-09 (×5): qty 1

## 2016-07-09 MED ORDER — CLONIDINE HCL 0.1 MG PO TABS
0.1000 mg | ORAL_TABLET | Freq: Every day | ORAL | Status: AC
  Administered 2016-07-13 – 2016-07-14 (×2): 0.1 mg via ORAL
  Filled 2016-07-09 (×3): qty 1

## 2016-07-09 MED ORDER — METHOCARBAMOL 500 MG PO TABS
500.0000 mg | ORAL_TABLET | Freq: Three times a day (TID) | ORAL | Status: AC | PRN
  Administered 2016-07-09 – 2016-07-12 (×5): 500 mg via ORAL
  Filled 2016-07-09 (×5): qty 1

## 2016-07-09 MED ORDER — HYDROXYZINE HCL 25 MG PO TABS
25.0000 mg | ORAL_TABLET | Freq: Four times a day (QID) | ORAL | Status: AC | PRN
  Administered 2016-07-12: 25 mg via ORAL
  Filled 2016-07-09 (×2): qty 1

## 2016-07-09 MED ORDER — SULFAMETHOXAZOLE-TRIMETHOPRIM 800-160 MG PO TABS
1.0000 | ORAL_TABLET | Freq: Two times a day (BID) | ORAL | Status: AC
  Administered 2016-07-09 – 2016-07-14 (×11): 1 via ORAL
  Filled 2016-07-09 (×13): qty 1

## 2016-07-09 MED ORDER — TRAZODONE HCL 100 MG PO TABS
100.0000 mg | ORAL_TABLET | Freq: Every evening | ORAL | Status: DC | PRN
  Administered 2016-07-09 – 2016-07-12 (×4): 100 mg via ORAL
  Filled 2016-07-09 (×4): qty 1

## 2016-07-09 MED ORDER — GABAPENTIN 300 MG PO CAPS
600.0000 mg | ORAL_CAPSULE | Freq: Three times a day (TID) | ORAL | Status: DC
  Administered 2016-07-09: 600 mg via ORAL
  Filled 2016-07-09: qty 2

## 2016-07-09 NOTE — Progress Notes (Signed)
36 year old male pt admitted on voluntary basis. [name] presents as anxious and fidgety and spoke about how he has been using and abusing drugs and is tired of doing it and wants to get off everything. He spoke about how he would like to get into a treatment center when he gets discharged from here. [place] does endorse passive SI on admission but able to contract for safety on the unit.

## 2016-07-09 NOTE — Tx Team (Signed)
Initial Treatment Plan 07/09/2016 9:53 PM [name] PBD:[id number]    PATIENT STRESSORS: Substance abuse   PATIENT STRENGTHS: Ability for insight Average or above average intelligence Capable of independent living General fund of knowledge Motivation for treatment/growth   PATIENT IDENTIFIED PROBLEMS: Depression Suicidal thoughts Substance Abuse "I want to get off all the shit" "Try to get into a treatment center"                     DISCHARGE CRITERIA:  Ability to meet basic life and health needs Improved stabilization in mood, thinking, and/or behavior Verbal commitment to aftercare and medication compliance Withdrawal symptoms are absent or subacute and managed without 24-hour nursing intervention  PRELIMINARY DISCHARGE PLAN: Attend aftercare/continuing care group Return to previous living arrangement  PATIENT/FAMILY INVOLVEMENT: This treatment plan has been presented to and reviewed with the patient, [name], and/or family member, .  The patient and family have been given the opportunity to ask questions and make suggestions.  [place], [place], [place] 07/09/2016, 9:53 PM

## 2016-07-09 NOTE — ED Notes (Signed)
Hourly rounding reveals patient sleeping in room. No complaints, stable, in no acute distress. Q15 minute rounds and monitoring via Security Cameras to continue. 

## 2016-07-09 NOTE — Progress Notes (Signed)
Pt skin assessed, v/s taken and belongings searched. Writer explained to pt the policy here at [facility] and required documents signed. Writer notified Brook, charge nurse that admission will need to be completed.

## 2016-07-09 NOTE — Progress Notes (Signed)
Pt did not attend the evening AA speaker meeting. Pt remained in bed. Clint Bolder, NT 07/09/16 8:29 PM

## 2016-07-09 NOTE — BH Assessment (Signed)
[facility] Assessment Progress Note  Per Corena Pilgrim, MD, this pt requires psychiatric hospitalization at this time.  Letitia Libra, RN, [facility] has assigned pt to [facility] Rm [id number].  Pt has signed Voluntary Admission and Consent for Treatment, as well as Consent to Release Information to his girlfriend and to Chucky May, MD, his outpatient provider, and a notification call has been placed to the latter.  Signed forms have been faxed to [facility].  Pt's nurse, Nena Jordan, has been notified, and agrees to send original paperwork along with pt via B[place]y Pries, and to call report to [phone].  Jalene Mullet, [place] Triage Specialist [phone]

## 2016-07-09 NOTE — Consult Note (Signed)
[facility] Psychiatry Consult   Reason for Consult:  Psychiatric evaluation Referring Physician:  EDP Patient Identification: [name] MRN:  [id number] Principal Diagnosis: Major depressive disorder, recurrent episode, severe ([place]) Diagnosis:   Patient Active Problem List   Diagnosis Date Noted  . Polysubstance (including opioids) dependence with physiological dependence ([place]) [F19.20] 07/09/2016    Priority: High  . Major depressive disorder, recurrent episode, severe ([place]) [F33.2] 12/05/2014    Priority: High  . Encounter for well adult exam with abnormal findings [F74.94] 12/20/2015  . Bruises easily [R23.8] 12/20/2015  . Squamous cell carcinoma [C44.92] 05/19/2015  . Itching [L29.9] 05/19/2015  . Major depressive disorder, recurrent, severe without psychotic features ([place]) [F33.2]   . Anxiety disorder [F41.9] 12/05/2014  . 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 male patient admitted with depression and relapse on illicits drugs.  HPI:  Patient reports long history of Polysubstance dependence dating back to age 89, ADH, MDD and Anxiety. Patient reports that he has been binging on different kinds of illicit drugs in the past one month, he is here for help to detox from illicit drugs. Patient presents with suicidal ideation, says he will die if he does not get help from drugs. He reports being depressed and hopeless due to inability to stop using drugs.  Past Psychiatric History: as above  Risk to Self: Is patient at risk for suicide?: yes Risk to Others:   denies Prior Inpatient Therapy:   Prior Outpatient Therapy:    Past Medical History:  Past Medical History:  Diagnosis Date  . Anemia   . Anxiety   . Arthritis   . Depression   . Hepatitis C   . Kidney stone   . Opiate dependence ([place])     Past Surgical History:  Procedure Laterality Date  . APPENDECTOMY     . left axillary node biopsy    . RADICAL NECK DISSECTION Left   . TONSILLECTOMY     Family History:  Family History  Problem Relation Age of Onset  . Kidney cancer Father     renal cell carcenoma  . Hodgkin's lymphoma Mother   . Colon cancer Paternal Grandfather     great  . Lung cancer Paternal Grandmother   . Diabetes Maternal Grandmother   . Pancreatic cancer Maternal Grandmother   . Melanoma Maternal Grandfather    Family Psychiatric  History:  Social History:  History  Alcohol Use No     History  Drug Use No    Comment: stopped marijuana in Aug 2017    Social History   Social History  . Marital status: Single    Spouse name: N/A  . Number of children: 1  . Years of education: 26   Occupational History  . Mechanic     Social History Main Topics  . Smoking status: Current Some Day Smoker    Packs/day: 0.50    Years: 9.00    Types: Cigarettes  . Smokeless tobacco: Former Systems developer    Types: Chew    Quit date: 04/16/2008  . Alcohol use No  . Drug use: No     Comment: stopped marijuana in Aug 2017  . Sexual activity: Not Asked   Other Topics Concern  . None   Social History Narrative   Fun: Play guitar and hang out with his daughter   Additional Social History:    Allergies:  Allergies  Allergen Reactions  . Ceclor [Cefaclor] Other (See Comments)    Does not recall, was a small child     Labs:  Results for orders placed or performed during the hospital encounter of 07/08/16 (from the past 48 hour(s))  Comprehensive metabolic panel     Status: None   Collection Time: 07/08/16  2:08 PM  Result Value Ref Range   Sodium 140 135 - 145 mmol/L   Potassium 3.9 3.5 - 5.1 mmol/L   Chloride 104 101 - 111 mmol/L   CO2 29 22 - 32 mmol/L   Glucose, Bld 96 65 - 99 mg/dL   BUN 16 6 - 20 mg/dL   Creatinine, Ser 0.96 0.61 - 1.24 mg/dL   Calcium 9.2 8.9 - 10.3 mg/dL   Total Protein 7.6 6.5 - 8.1 g/dL   Albumin 4.2 3.5 - 5.0 g/dL   AST 22 15 - 41 U/L   ALT  18 17 - 63 U/L   Alkaline Phosphatase 48 38 - 126 U/L   Total Bilirubin 0.7 0.3 - 1.2 mg/dL   GFR calc non Af Amer >60 >60 mL/min   GFR calc Af Amer >60 >60 mL/min    Comment: (NOTE) The eGFR has been calculated using the CKD EPI equation. This calculation has not been validated in all clinical situations. eGFR's persistently <60 mL/min signify possible Chronic Kidney Disease.    Anion gap 7 5 - 15  Ethanol     Status: None   Collection Time: 07/08/16  2:08 PM  Result Value Ref Range   Alcohol, Ethyl (B) <5 <5 mg/dL    Comment:        LOWEST DETECTABLE LIMIT FOR SERUM ALCOHOL IS 5 mg/dL FOR MEDICAL PURPOSES ONLY   cbc     Status: None   Collection Time: 07/08/16  2:08 PM  Result Value Ref Range   WBC 7.5 4.0 - 10.5 K/uL   RBC 4.67 4.22 - 5.81 MIL/uL   Hemoglobin 14.0 13.0 - 17.0 g/dL   HCT 40.7 39.0 - 52.0 %   MCV 87.2 78.0 - 100.0 fL   MCH 30.0 26.0 - 34.0 pg   MCHC 34.4 30.0 - 36.0 g/dL   RDW 13.2 11.5 - 15.5 %   Platelets 335 150 - 400 K/uL  Rapid urine drug screen (hospital performed)     Status: Abnormal   Collection Time: 07/08/16  2:08 PM  Result Value Ref Range   Opiates NONE DETECTED NONE DETECTED   Cocaine NONE DETECTED NONE DETECTED   Benzodiazepines NONE DETECTED NONE DETECTED   Amphetamines POSITIVE (A) NONE DETECTED   Tetrahydrocannabinol POSITIVE (A) NONE DETECTED   Barbiturates NONE DETECTED NONE DETECTED    Comment:        DRUG SCREEN FOR MEDICAL PURPOSES ONLY.  IF CONFIRMATION IS NEEDED FOR ANY PURPOSE, NOTIFY LAB WITHIN 5 DAYS.        LOWEST DETECTABLE LIMITS FOR URINE DRUG SCREEN Drug Class       Cutoff (ng/mL) Amphetamine      1000 Barbiturate      200 Benzodiazepine   638 Tricyclics       177 Opiates          300 Cocaine          300 THC              50     Current Facility-Administered Medications  Medication Dose Route Frequency Provider Last Rate Last Dose  . 0.9 %  sodium chloride infusion  500 mL Intravenous Continuous Milus Banister, MD      . cloNIDine (CATAPRES) tablet 0.1 mg  0.1 mg Oral QID Patrecia Pour, NP   0.1 mg at 07/09/16 1033   Followed by  . [START ON 07/10/2016] cloNIDine (CATAPRES) tablet 0.1 mg  0.1 mg Oral BH-qamhs Patrecia Pour, NP       Followed by  . [START ON 07/13/2016] cloNIDine (CATAPRES) tablet 0.1 mg  0.1 mg Oral QAC breakfast Patrecia Pour, NP      . dicyclomine (BENTYL) tablet 20 mg  20 mg Oral Q6H PRN Patrecia Pour, NP      . gabapentin (NEURONTIN) capsule 600 mg  600 mg Oral TID Corena Pilgrim, MD      . hydrOXYzine (ATARAX/VISTARIL) tablet 25 mg  25 mg Oral Q6H PRN Patrecia Pour, NP      . loperamide (IMODIUM) capsule 2-4 mg  2-4 mg Oral PRN Patrecia Pour, NP      . methocarbamol (ROBAXIN) tablet 500 mg  500 mg Oral Q8H PRN Patrecia Pour, NP      . naproxen (NAPROSYN) tablet 500 mg  500 mg Oral BID PRN Patrecia Pour, NP      . nicotine (NICODERM CQ - dosed in mg/24 hours) patch 21 mg  21 mg Transdermal Daily Varney Biles, MD   Stopped at 07/08/16 1615  . ondansetron (ZOFRAN-ODT) disintegrating tablet 4 mg  4 mg Oral Q6H PRN Patrecia Pour, NP      . sulfamethoxazole-trimethoprim (BACTRIM DS,SEPTRA DS) 800-160 MG per tablet 1 tablet  1 tablet Oral BID Varney Biles, MD   1 tablet at 07/09/16 1033  . traZODone (DESYREL) tablet 100 mg  100 mg Oral QHS PRN Corena Pilgrim, MD      . Vilazodone HCl (VIIBRYD) TABS 40 mg  40 mg Oral Q supper Patrecia Pour, NP   40 mg at 07/08/16 1611   Current Outpatient Prescriptions  Medication Sig Dispense Refill  . acetaminophen (TYLENOL) 500 MG tablet Take 500 mg by mouth every 6 (six) hours as needed.    . ADDERALL XR 30 MG 24 hr capsule Take 30 mg by mouth every morning.  0  . ALPRAZolam (XANAX) 1 MG tablet Take 1 mg by mouth at bedtime as needed for anxiety.    Marland Kitchen amphetamine-dextroamphetamine (ADDERALL) 30 MG tablet Take 30 mg by mouth daily.  0  . ibuprofen (ADVIL,MOTRIN) 200 MG tablet Take 400 mg by mouth.     . ondansetron (ZOFRAN) 4  MG tablet Take 1 tablet (4 mg total) by mouth every 6 (six) hours. (Patient taking differently: Take 4 mg by mouth every 8 (eight) hours as needed for nausea. ) 40 tablet 0  . SUBOXONE 8-2 MG FILM Take 8 mg by mouth 2 (two) times daily at 10 AM and 5 PM.  0  . VIIBRYD 40 MG TABS Take 40 mg by mouth 1 day or 1 dose.  12  . citalopram (CELEXA) 40 MG tablet Take 1 tablet (40 mg total) by mouth daily. (Patient not taking: Reported on 05/08/2016) 30 tablet 0  . pantoprazole (PROTONIX) 40 MG tablet Take 1 tab every morning. (Patient not taking: Reported on 05/08/2016) 90 tablet 3    Musculoskeletal: Strength & Muscle Tone: within normal limits Gait & Station: normal Patient leans: N/A  Psychiatric Specialty Exam: Physical Exam  Psychiatric: His speech is normal and behavior is normal. His mood  appears anxious. Cognition and memory are normal. He expresses impulsivity. He exhibits a depressed mood. He expresses suicidal ideation.    Review of Systems  Constitutional: Positive for diaphoresis and malaise/fatigue.  HENT: Negative.   Eyes: Negative.   Respiratory: Negative.   Cardiovascular: Negative.   Gastrointestinal: Positive for nausea.  Genitourinary: Negative.   Musculoskeletal: Positive for myalgias.  Skin: Negative.   Neurological: Negative.   Endo/Heme/Allergies: Negative.   Psychiatric/Behavioral: Positive for depression, substance abuse and suicidal ideas. The patient is nervous/anxious.     Blood pressure 104/66, pulse 77, temperature 98.1 F (36.7 C), temperature source Oral, resp. rate 18, SpO2 99 %.There is no height or weight on file to calculate BMI.  General Appearance: Casual  Eye Contact:  Good  Speech:  Clear and Coherent  Volume:  Normal  Mood:  Anxious, Depressed and Hopeless  Affect:  Constricted  Thought Process:  Coherent  Orientation:  Full (Time, Place, and Person)  Thought Content:  Logical  Suicidal Thoughts:  Yes.  without intent/plan  Homicidal Thoughts:   No  Memory:  Immediate;   Fair Recent;   Fair Remote;   Good  Judgement:  Poor  Insight:  Shallow  Psychomotor Activity:  Psychomotor Retardation  Concentration:  Concentration: Fair and Attention Span: Fair  Recall:  [place] of Knowledge:  Good  Language:  Good  Akathisia:  No  Handed:  Right  AIMS (if indicated):     Assets:  Communication Skills Desire for Improvement  ADL's:  Intact  Cognition:  WNL  Sleep:   poor      Treatment Plan Summary: Daily contact with patient to assess and evaluate symptoms and progress in treatment and Medication management  Clonidine detox protocol Gabapentin 666m tid for cocaine detox Continue Viibrid 40 mg for depression  Disposition: Recommend psychiatric Inpatient admission when medically cleared.  ACorena Pilgrim MD 07/09/2016 10:49 AM

## 2016-07-09 NOTE — ED Provider Notes (Signed)
[facility] DEPT Provider Note   CSN: [id number] Arrival date & time: 07/08/16  1332     History   Chief Complaint Chief Complaint  Patient presents with  . Medical Clearance    HPI [name] is a 36 y.o. male.  HPI Pt sent here from [facility] for medical clearance. Pt reports relapsed on using meth. Pt reports possible skin infection right hand and face - as he has some redness to those areas.  Denies SI nor HI. Besides meth, no other substance abuse. Pt denies n/v/f/c.  Past Medical History:  Diagnosis Date  . Anemia   . Anxiety   . Arthritis   . Depression   . Hepatitis C   . Kidney stone   . Opiate dependence [facility])     Patient Active Problem List   Diagnosis Date Noted  . Polysubstance (including opioids) dependence with physiological dependence ([place]) 07/09/2016  . Encounter for well adult exam with abnormal findings 12/20/2015  . Bruises easily 12/20/2015  . Squamous cell carcinoma 05/19/2015  . Itching 05/19/2015  . Major depressive disorder, recurrent, severe without psychotic features ([place])   . Anxiety disorder 12/05/2014  . Major depressive disorder, recurrent episode, severe ([place]) 12/05/2014  . 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 APPENDECTOMY    . left axillary node biopsy    . RADICAL NECK DISSECTION Left   . TONSILLECTOMY         Home Medications    Prior to Admission medications   Medication Sig Start Date End Date Taking? Authorizing Provider  acetaminophen (TYLENOL) 500 MG tablet Take 500 mg by mouth every 6 (six) hours as needed.   Yes Historical Provider, MD  ADDERALL XR 30 MG 24 hr capsule Take 30 mg by mouth every morning. 04/13/16  Yes Historical Provider, MD  ALPRAZolam Duanne Moron) 1 MG tablet Take 1 mg by mouth at bedtime as needed for anxiety.   Yes Historical Provider, MD  amphetamine-dextroamphetamine (ADDERALL) 30 MG tablet Take 30 mg by mouth daily. 06/12/16  Yes Historical  Provider, MD  ibuprofen (ADVIL,MOTRIN) 200 MG tablet Take 400 mg by mouth.    Yes Historical Provider, MD  ondansetron (ZOFRAN) 4 MG tablet Take 1 tablet (4 mg total) by mouth every 6 (six) hours. Patient taking differently: Take 4 mg by mouth every 8 (eight) hours as needed for nausea.  01/10/16  Yes Amy S Esterwood, PA-C  VIIBRYD 40 MG TABS Take 40 mg by mouth 1 day or 1 dose. 04/12/16  Yes Historical Provider, MD  citalopram (CELEXA) 40 MG tablet Take 1 tablet (40 mg total) by mouth daily. Patient not taking: Reported on 05/08/2016 12/09/14   Kerrie Buffalo, NP  pantoprazole (PROTONIX) 40 MG tablet Take 1 tab every morning. Patient not taking: Reported on 05/08/2016 01/10/16   Alfredia Ferguson, PA-C    Family History Family History  Problem Relation Age of Onset  . Kidney cancer Father     renal cell carcenoma  . Hodgkin's lymphoma Mother   . Colon cancer Paternal Grandfather     great  . Lung cancer Paternal Grandmother   . Diabetes Maternal Grandmother   . Pancreatic cancer Maternal Grandmother   . Melanoma Maternal Grandfather     Social History Social History  Substance Use Topics  . Smoking status: Current Some Day Smoker    Packs/day: 0.50    Years: 9.00    Types: Cigarettes  . Smokeless tobacco: Former Systems developer  Types: Sarina Ser    Quit date: 04/16/2008  . Alcohol use No     Allergies   Ceclor [cefaclor]   Review of Systems Review of Systems  ROS 10 Systems reviewed and are negative for acute change except as noted in the HPI.     Physical Exam Updated Vital Signs BP 112/65   Pulse 88   Temp 98.9 F (37.2 C) (Oral)   Resp 20   SpO2 100%   Physical Exam  Constitutional: He is oriented to person, place, and time. He appears well-developed.  HENT:  Head: Atraumatic.  Neck: Neck supple.  Cardiovascular: Normal rate.   Pulmonary/Chest: Effort normal.  Neurological: He is alert and oriented to person, place, and time.  Skin: Skin is warm.  R forearm has mild  swelling. There is no significant warmth to touch. There is mild erythema. No crepitus. Pt able to pronate supine and elbow flexion and extention is normal.  Nursing note and vitals reviewed.    ED Treatments / Results  Labs (all labs ordered are listed, but only abnormal results are displayed) Labs Reviewed  RAPID URINE DRUG SCREEN, HOSP PERFORMED - Abnormal; Notable for the following:       Result Value   Amphetamines POSITIVE (*)    Tetrahydrocannabinol POSITIVE (*)    All other components within normal limits  CULTURE, BLOOD (ROUTINE X 2)  CULTURE, BLOOD (ROUTINE X 2)  COMPREHENSIVE METABOLIC PANEL  ETHANOL  CBC    EKG  EKG Interpretation None       Radiology No results found.  Procedures Procedures (including critical care time)  Medications Ordered in ED Medications - No data to display   Initial Impression / Assessment and Plan / ED Course  I have reviewed the triage vital signs and the nursing notes.  Pertinent labs & imaging results that were available during my care of the patient were reviewed by me and considered in my medical decision making (see chart for details).     Pt is medically cleared for psych admission. R forearm has some swelling -  And so we will start him on bactrim. Concerns low for blood clots. Pt has 0 SIRs at arrival is non toxic.  Final Clinical Impressions(s) / ED Diagnoses   Final diagnoses:  Polysubstance abuse    New Prescriptions Discharge Medication List as of 07/09/2016  5:53 PM       Varney Biles, MD 07/09/16 1820

## 2016-07-09 NOTE — Progress Notes (Signed)
07/09/16 1353:  LRT went to pt room to introduce self and offer activities.  Pt stated he was feeling nauseous and maybe he would do something later.  Victorino Sparrow, LRT/CTRS

## 2016-07-09 NOTE — ED Notes (Signed)
Hourly rounding reveals patient in room. No complaints, stable, in no acute distress. Q15 minute rounds and monitoring via Security Cameras to continue. 

## 2016-07-09 NOTE — ED Notes (Signed)
Pt discharged ambulatory with [place] driver.  Pt was calm and cooperative.  All belongings were sent with pt.

## 2016-07-10 DIAGNOSIS — F329 Major depressive disorder, single episode, unspecified: Secondary | ICD-10-CM

## 2016-07-10 DIAGNOSIS — Z79899 Other long term (current) drug therapy: Secondary | ICD-10-CM

## 2016-07-10 DIAGNOSIS — F1520 Other stimulant dependence, uncomplicated: Secondary | ICD-10-CM

## 2016-07-10 DIAGNOSIS — F1920 Other psychoactive substance dependence, uncomplicated: Secondary | ICD-10-CM

## 2016-07-10 MED ORDER — RISPERIDONE 0.5 MG PO TABS
0.5000 mg | ORAL_TABLET | Freq: Two times a day (BID) | ORAL | Status: DC
  Administered 2016-07-10 – 2016-07-12 (×4): 0.5 mg via ORAL
  Filled 2016-07-10 (×10): qty 1
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
## 2016-07-10 NOTE — Progress Notes (Signed)
D:  Patient denied SI and HI, contracts for safety.  Denied A/V hallucinations.   A:  Medications administered per MD orders.  Emotional support and encouragement given patient. A:  Safety maintained with 15 minute checks. Patient declined pain/anxiety medication today.  Has been seen on phone talking to family today.  Patient stated he lives with his girlfriend and was at [facility] in August 2017.

## 2016-07-10 NOTE — BHH Suicide Risk Assessment (Signed)
BHH INPATIENT:  Family/Significant Other Suicide Prevention Education  Suicide Prevention Education:  Contact Attempts: Thayer Ohm (pt's girlfriend) [phone] has been identified by the patient as the family member/significant other with whom the patient will be residing, and identified as the person(s) who will aid the patient in the event of a mental health crisis.  With written consent from the patient, two attempts were made to provide suicide prevention education, prior to and/or following the patient's discharge.  We were unsuccessful in providing suicide prevention education.  A suicide education pamphlet was given to the patient to share with family/significant other.  Date and time of first attempt: 07/10/16 at 1:40PM  [employer] LCSW 07/10/2016, 1:42 PM   SPE completed with Elmyra Ricks. Aftercare plan also reviewed. Pt's girlfriend is fully supportive of pt pursuing inpatient treatment at [facility].  Maxie Better, MSW, LCSW Clinical Social Worker 07/10/2016 1:51 PM

## 2016-07-10 NOTE — BHH Suicide Risk Assessment (Signed)
[facility] Admission Suicide Risk Assessment   Nursing information obtained from:    Demographic factors:    Current Mental Status:    Loss Factors:    Historical Factors:    Risk Reduction Factors:     Total Time spent with patient: 30 minutes Principal Problem: Substance Induced Mood Disorder Diagnosis:   Patient Active Problem List   Diagnosis Date Noted  . 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 Polysubstance (including opioids) dependence with physiological dependence ([place]) [F19.20] 07/09/2016  . MDD (major depressive disorder) [F32.9] 07/09/2016  . Encounter for well adult exam with abnormal findings [W09.81] 12/20/2015  . Bruises easily [R23.8] 12/20/2015  . Squamous cell carcinoma [C44.92] 05/19/2015  . Itching [L29.9] 05/19/2015  . Major depressive disorder, recurrent, severe without psychotic features ([place]) [F33.2]   . Anxiety disorder [F41.9] 12/05/2014  . Major depressive disorder, recurrent episode, severe ([place]) [F33.2] 12/05/2014  . Narcolepsy [G47.419] 10/27/2013   Subjective Data:  36 yo Caucasian male, single, lives with is fiancee and their three yo daughter.  Background history of SUD. Self presented requesting for detox. Reports daily use of cocaine and methamphetamine. UDS was positive for methamphetamine and THC. Patient was very tearful and distressed at presentation. He reports inability to sleep in days. He feels restless and depressed. Patient reports crawling sensation all over. He expressed thoughts of suicide if he does not get help. Patient has been managed here in the past for substance related issues. Says he was sober but relapsed about a month ago. His girlfriend has decided to end their relationship because of his drug use. Patient states that while his family was away, he relapsed. Says he made a huge mistake and he wants to make things right again. Says he knows he let them down " I feel hopeless but I am not suicidal ,,,, I want to win  my family back" .Patient says he just reconnected with a friend who can help him get into a rehab at [facility]. He plans to work with our SW towards actualizing this. No auditory or visual hallucinations. Tactile hallucinations. This is responsible for the scratch marks on his face. No persecutory or paranoid delusion. No passivity of will. No passivity of thought. No thoughts of violence. No homicidal thoughts.  Patient states that he used to be on Suboxne. Says he just wants to be clean from everything  Continued Clinical Symptoms:  Alcohol Use Disorder Identification Test Final Score (AUDIT): 1 The "Alcohol Use Disorders Identification Test", Guidelines for Use in Primary Care, Second Edition.  World Pharmacologist [facility]). Score between 0-7:  no or low risk or alcohol related problems. Score between 8-15:  moderate risk of alcohol related problems. Score between 16-19:  high risk of alcohol related problems. Score 20 or above:  warrants further diagnostic evaluation for alcohol dependence and treatment.   CLINICAL FACTORS:  Depression Substance intoxication/withdrawals Recent end of his relationship   Musculoskeletal: Strength & Muscle Tone: within normal limits Gait & Station: normal Patient leans: N/A  Psychiatric Specialty Exam: Physical Exam  Constitutional: He is oriented to person, place, and time. He appears distressed.  HENT:  Head: Normocephalic and atraumatic.  Eyes: Pupils are equal, round, and reactive to light.  Neck: Normal range of motion. Neck supple.  Cardiovascular: Normal rate, regular rhythm and normal heart sounds.   Respiratory: Effort normal and breath sounds normal.  GI: Soft. Bowel sounds are normal.  Musculoskeletal: Normal range of motion.  Neurological: He is alert and  oriented to person, place, and time. He has normal reflexes.  Skin:  Multiple skin abrasions.     ROS  Blood pressure 108/64, pulse 71, temperature 97.6 F (36.4 C), temperature  source Oral, resp. rate 18, height 5\' 7"  (1.702 m), weight 72.6 kg (160 lb).Body mass index is 25.06 kg/m.  General Appearance: Poor personal hygiene, scratch marks all over his face and body. Tearful and distressed during interview. Restless but withdrawn.   Eye Contact:  Minimal  Speech:  Slow  Volume:  Decreased  Mood:  Depressed and Dysphoric  Affect:  Congruent  Thought Process:  Linear  Orientation:  Full (Time, Place, and Person)  Thought Content:  Hopelessness, negative ruminations about his relapse. No delusional theme. No preoccupation with violent thoughts. No obsession. Tactile hallucination. No hallucination in any other modality   Suicidal Thoughts:  Yes.  without intent/plan  Homicidal Thoughts:  No  Memory:  Immediate;   Fair Recent;   Fair Remote;   Fair  Judgement:  Fair  Insight:  Good  Psychomotor Activity:  Restlessness  Concentration:  Concentration: Fair and Attention Span: Fair  Recall:  Good  Fund of Knowledge:  Good  Language:  Good  Akathisia:  No  Handed:    AIMS (if indicated):     Assets:  Desire for Improvement Intimacy  ADL's:  Impaired  Cognition:  Impaired,  Mild  Sleep:  Number of Hours: 5.75      COGNITIVE FEATURES THAT CONTRIBUTE TO RISK:  None    SUICIDE RISK:   Moderate:  Frequent suicidal ideation with limited intensity, and duration, some specificity in terms of plans, no associated intent, good self-control, limited dysphoria/symptomatology, some risk factors present, and identifiable protective factors, including available and accessible social support.  PLAN OF CARE:  Patient is presenting with substance intoxication/withdrawals. He is depressed after finding out that his girlfriend is ending their relationship. He is motivated to seek help. No active suicidal thoughts. We discussed targeting substance induced agitation with Risperidone. Patient consented to treatment after we reviewed the risks and benefits. He has also consented  to optimization of Mirtazapine to an antidepressant dose.   Psychiatric: SUD ( THC, cocaine, methamphetamine, opiates) MDD Substance Induced Psychotic Disorder  Medical:  Psychosocial:  Recent end of his relationship  PLAN: 1. Risperidone 0.5 mg BID 2. Increase Mirtazapine to 15 mg HS 3. Monitor mood, behavior and interaction with peers 4. Motivational enhancement  5. SW facilitate inpatient rehab 6. Collateral from his family.    I certify that inpatient services furnished can reasonably be expected to improve the patient's condition.   [place], MD 07/10/2016, 12:56 PM

## 2016-07-10 NOTE — BHH Group Notes (Signed)
The focus of this group is to educate the patient on the purpose and policies of crisis stabilization and provide a format to answer questions about their admission.  The group details unit policies and expectations of patients while admitted.  Patient did not attend 0900 nurse education orientation group this morning.  Patient stayed in room.  

## 2016-07-10 NOTE — Plan of Care (Signed)
Problem: Education: Goal: Utilization of techniques to improve thought processes will improve Outcome: Progressing Nurse discussed depression/anxiety/coping skills with patient.    

## 2016-07-10 NOTE — BHH Group Notes (Signed)
[place] LCSW Group Therapy  07/10/2016 1:57 PM  Type of Therapy:  Group Therapy  Participation Level:  Did Not Attend-pt tearful; in bed; meeting with CSW and MD during group time. Excused.   Summary of Progress/Problems: [facility] Speaker came to talk about his personal journey with substance abuse and addiction. The pt processed ways by which to relate to the speaker. [place] speaker provided handouts and educational information pertaining to groups and services offered by the [facility].   Sion Thane N Smart LCSW 07/10/2016, 1:57 PM

## 2016-07-10 NOTE — Tx Team (Signed)
Interdisciplinary Treatment and Diagnostic Plan Update  07/10/2016 Time of Session: 0930 [name] MRN: [id number]  Principal Diagnosis: MDD severe  Secondary Diagnoses: Active Problems:   MDD (major depressive disorder)   Current Medications:  Current Facility-Administered Medications  Medication Dose Route Frequency Provider Last Rate Last Dose  . acetaminophen (TYLENOL) tablet 650 mg  650 mg Oral Q6H PRN Derrill Center, NP      . cloNIDine (CATAPRES) tablet 0.1 mg  0.1 mg Oral QID Derrill Center, NP   0.1 mg at 07/10/16 0813   Followed by  . cloNIDine (CATAPRES) tablet 0.1 mg  0.1 mg Oral BH-qamhs Derrill Center, NP       Followed by  . [START ON 07/13/2016] cloNIDine (CATAPRES) tablet 0.1 mg  0.1 mg Oral QAC breakfast Derrill Center, NP      . dicyclomine (BENTYL) tablet 20 mg  20 mg Oral Q6H PRN Derrill Center, NP      . gabapentin (NEURONTIN) capsule 600 mg  600 mg Oral TID Derrill Center, NP   600 mg at 07/10/16 0813  . hydrOXYzine (ATARAX/VISTARIL) tablet 25 mg  25 mg Oral Q6H PRN Derrill Center, NP      . loperamide (IMODIUM) capsule 2-4 mg  2-4 mg Oral PRN Derrill Center, NP      . magnesium hydroxide (MILK OF MAGNESIA) suspension 30 mL  30 mL Oral Daily PRN Derrill Center, NP      . methocarbamol (ROBAXIN) tablet 500 mg  500 mg Oral Q8H PRN Derrill Center, NP   500 mg at 07/09/16 2116  . naproxen (NAPROSYN) tablet 500 mg  500 mg Oral BID PRN Derrill Center, NP      . ondansetron (ZOFRAN-ODT) disintegrating tablet 4 mg  4 mg Oral Q6H PRN Derrill Center, NP      . sulfamethoxazole-trimethoprim (BACTRIM DS,SEPTRA DS) 800-160 MG per tablet 1 tablet  1 tablet Oral BID Derrill Center, NP   1 tablet at 07/10/16 0813  . traZODone (DESYREL) tablet 100 mg  100 mg Oral QHS PRN Derrill Center, NP   100 mg at 07/09/16 2116  . Vilazodone HCl (VIIBRYD) TABS 40 mg  40 mg Oral Q supper Derrill Center, NP       PTA Medications: Facility-Administered Medications Prior to  Admission  Medication Dose Route Frequency Provider Last Rate Last Dose  . 0.9 %  sodium chloride infusion  500 mL Intravenous Continuous Milus Banister, MD       Prescriptions Prior to Admission  Medication Sig Dispense Refill Last Dose  . acetaminophen (TYLENOL) 500 MG tablet Take 500 mg by mouth every 6 (six) hours as needed.   Past Week at Unknown time  . ADDERALL XR 30 MG 24 hr capsule Take 30 mg by mouth every morning.  0 Past Week at Unknown time  . ALPRAZolam (XANAX) 1 MG tablet Take 1 mg by mouth at bedtime as needed for anxiety.   Past Week at Unknown time  . amphetamine-dextroamphetamine (ADDERALL) 30 MG tablet Take 30 mg by mouth daily.  0   . citalopram (CELEXA) 40 MG tablet Take 1 tablet (40 mg total) by mouth daily. (Patient not taking: Reported on 05/08/2016) 30 tablet 0 Not Taking at Unknown time  . ibuprofen (ADVIL,MOTRIN) 200 MG tablet Take 400 mg by mouth.    Past Week at Unknown time  . ondansetron (ZOFRAN) 4 MG tablet Take 1 tablet (  4 mg total) by mouth every 6 (six) hours. (Patient taking differently: Take 4 mg by mouth every 8 (eight) hours as needed for nausea. ) 40 tablet 0 Past Week at Unknown time  . pantoprazole (PROTONIX) 40 MG tablet Take 1 tab every morning. (Patient not taking: Reported on 05/08/2016) 90 tablet 3 Not Taking at Unknown time  . VIIBRYD 40 MG TABS Take 40 mg by mouth 1 day or 1 dose.  12 Past Week at Unknown time    Patient Stressors: Substance abuse  Patient Strengths: Ability for insight Average or above average intelligence Capable of independent living [employer] of knowledge Motivation for treatment/growth  Treatment Modalities: Medication Management, Group therapy, Case management,  1 to 1 session with clinician, Psychoeducation, Recreational therapy.   Physician Treatment Plan for Primary Diagnosis: MDD severe  Medication Management: Evaluate patient's response, side effects, and tolerance of medication regimen.  Therapeutic  Interventions: 1 to 1 sessions, Unit Group sessions and Medication administration.  Evaluation of Outcomes: Progressing  Physician Treatment Plan for Secondary Diagnosis: Active Problems:   MDD (major depressive disorder)  Long Term Goal(s):     Short Term Goals:       Medication Management: Evaluate patient's response, side effects, and tolerance of medication regimen.  Therapeutic Interventions: 1 to 1 sessions, Unit Group sessions and Medication administration.  Evaluation of Outcomes: Progressing   RN Treatment Plan for Primary Diagnosis: MDD severe Long Term Goal(s): Knowledge of disease and therapeutic regimen to maintain health will improve  Short Term Goals: Ability to remain free from injury will improve, Ability to disclose and discuss suicidal ideas and Ability to identify and develop effective coping behaviors will improve  Medication Management: RN will administer medications as ordered by provider, will assess and evaluate patient's response and provide education to patient for prescribed medication. RN will report any adverse and/or side effects to prescribing provider.  Therapeutic Interventions: 1 on 1 counseling sessions, Psychoeducation, Medication administration, Evaluate responses to treatment, Monitor vital signs and CBGs as ordered, Perform/monitor CIWA, COWS, AIMS and Fall Risk screenings as ordered, Perform wound care treatments as ordered.  Evaluation of Outcomes: Progressing   LCSW Treatment Plan for Primary Diagnosis: MDD severe Long Term Goal(s): Safe transition to appropriate next level of care at discharge, Engage patient in therapeutic group addressing interpersonal concerns.  Short Term Goals: Engage patient in aftercare planning with referrals and resources, Facilitate patient progression through stages of change regarding substance use diagnoses and concerns and Identify triggers associated with mental health/substance abuse issues  Therapeutic  Interventions: Assess for all discharge needs, 1 to 1 time with Social worker, Explore available resources and support systems, Assess for adequacy in community support network, Educate family and significant other(s) on suicide prevention, Complete Psychosocial Assessment, Interpersonal group therapy.  Evaluation of Outcomes: Progressing   Progress in Treatment: Attending groups: Yes. Participating in groups: Yes. Taking medication as prescribed: Yes. Toleration medication: Yes. Family/Significant other contact made: No, will contact:  family member/fiance if patient consents Patient understands diagnosis: Yes. Discussing patient identified problems/goals with staff: Yes. Medical problems stabilized or resolved: Yes. Denies suicidal/homicidal ideation: No. Passive SI/Able to contract for safety on the unit.  Issues/concerns per patient self-inventory: No. Other: n/a  New problem(s) identified: No, Describe:  n/a  New Short Term/Long Term Goal(s): detox; medication management; development of comprehensive mental wellness/sobriety plan.   Discharge Plan or Barriers: CSW assessing for appropriate referrals. Pt live with his girlfriend and 55 yo daughter. He sees Dr. Toy Care  for medication management and suboxone maintenance. Hx of [facility] [place] for counseling where he was referred during his last admission to [facility] 12/05/14.   Reason for Continuation of Hospitalization: Depression Medication stabilization Suicidal ideation Withdrawal symptoms  Estimated Length of Stay: 3-5 days   Attendees: Patient: 07/10/2016 8:24 AM  Physician: Dr. Sanjuana Letters MD 07/10/2016 8:24 AM  Nursing: Eustaquio Maize RN 07/10/2016 8:24 AM  RN Care Manager: Lars Pinks CM 07/10/2016 8:24 AM  Social Worker: Maxie Better, LCSW 07/10/2016 8:24 AM  Recreational Therapist: Rhunette Croft 07/10/2016 8:24 AM  Other: Lindell Spar NP; Samuel Jester NP 07/10/2016 8:24 AM  Other:  07/10/2016 8:24 AM  Other: 07/10/2016 8:24 AM     Scribe for Treatment Team: [place], LCSW 07/10/2016 8:24 AM

## 2016-07-10 NOTE — H&P (Signed)
Psychiatric Admission Assessment Adult  Patient Identification: [name]  MRN:  [id number]  Date of Evaluation:  07/10/2016  Chief Complaint:  MDD REC SEV   Principal Diagnosis: Amphetamine use disorder, severe, dependence, Major depressive disorder, recurrent episodes, severe.  Diagnosis:   Patient Active Problem List   Diagnosis Date Noted  . Amphetamine use disorder, severe ([place]) [F15.20] 07/10/2016  . Polysubstance (including opioids) dependence with physiological dependence ([place]) [F19.20] 07/09/2016  . MDD (major depressive disorder) [F32.9] 07/09/2016  . Encounter for well adult exam with abnormal findings [H73.42] 12/20/2015  . Bruises easily [R23.8] 12/20/2015  . Squamous cell carcinoma [C44.92] 05/19/2015  . Itching [L29.9] 05/19/2015  . Major depressive disorder, recurrent, severe without psychotic features ([place]) [F33.2]   . Anxiety disorder [F41.9] 12/05/2014  . Major depressive disorder, recurrent episode, severe ([place]) [F33.2] 12/05/2014  . 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 He was a patient in this hospital in August of 2016 for worsening symptoms of depression. He received mood stabilization treatment & was discharged to follow-up care with Dr. Toy Care & for counseling sessions at the [facility]. This time around, [name] is being admitted to the hospital due to drug intoxication & worsening symptoms of depression. His UDS on admission was positive for Amphetamine & THC.   During this assessment, [name] reports, "I walked-in to this hospital yesterday because I was a mess. I had just relapsed on Methamphetamine about a month ago. I was using it intravenously. I just want to stop, but I cannot stop on my own. I have messed up big time. My girlfriend is mad at me & would not  have anything to do with me any more (sobbing). I have been talking to this guy named Deon who knows a treatment place in [place]. He has secured a bed for me there right now. I can go there now if you will allow me to leave today. I need to go there ASAP. I was sober from drugs x few months prior to my recent relapse. I'm feeling very depressed & disappointed in myself.  The last time I was in this hospital, it was for depression. 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 My sleep is always fragmented. I stay tired during the day, falling in & out of sleep". [name] presents with body aches, stomach aches, skin crawling & high anxiety level. He is tearful. He has used buprenorphine treatment for opioid addiction.  Associated Signs/Symptoms:  Depression Symptoms:  depressed mood, insomnia, psychomotor agitation, feelings of worthlessness/guilt, anxiety, decreased appetite,  (Hypo) Manic Symptoms:  Impulsivity,  Anxiety Symptoms:  Excessive Worry,  Psychotic Symptoms: Currently denies any hallucinations, delusional thoughts or paranoia.  PTSD Symptoms:Currently denies any SIHI, AVH, delusional thoughts or paranoia.   Total Time spent with patient: 1 hour  Past Psychiatric History: Major depressive disorder, Polysubstance dependence.  Is the patient at risk to self? No.  Has the patient been a risk to self in the past 6 months? No.  Has the patient been a risk to self within the distant past? No.  Is the patient a risk to others? No.  Has the patient been a risk to others in the past 6 months? No.  Has the patient been a risk to others within the distant past? No.   Prior Inpatient Therapy: Yes [facility]  x 1) previously. Prior Outpatient Therapy: Yes.  Alcohol Screening: 1. How often do you have a drink containing alcohol?: Monthly or less 2. How many drinks containing alcohol do you have on a typical day when you are drinking?: 1 or 2 3. How often do you have six or more drinks on  one occasion?: Never Preliminary Score: 0 9. Have you or someone else been injured as a result of your drinking?: No 10. Has a relative or friend or a doctor or another health worker been concerned about your drinking or suggested you cut down?: No Alcohol Use Disorder Identification Test Final Score (AUDIT): 1 Brief Intervention: AUDIT score less than 7 or less-screening does not suggest unhealthy drinking-brief intervention not indicated  Substance Abuse History in the last 12 months:  Yes.    Consequences of Substance Abuse: Medical Consequences:  Liver damage, Possible death by overdose Legal Consequences:  Arrests, jail time, Loss of driving privilege. Family Consequences:  Family discord, divorce and or separation.   Previous Psychotropic Medications: Yes   Psychological Evaluations: Yes   Past Medical History:  Past Medical History:  Diagnosis Date  . Anemia   . Anxiety   . Arthritis   . Depression   . Hepatitis C   . Kidney stone   . Opiate dependence ([place])     Past Surgical History:  Procedure Laterality Date  . APPENDECTOMY    . left axillary node biopsy    . RADICAL NECK DISSECTION Left   . TONSILLECTOMY     Family History:  Family History  Problem Relation Age of Onset  . Kidney cancer Father     renal cell carcenoma  . Hodgkin's lymphoma Mother   . Colon cancer Paternal Grandfather     great  . Lung cancer Paternal Grandmother   . Diabetes Maternal Grandmother   . Pancreatic cancer Maternal Grandmother   . Melanoma Maternal Grandfather    Family Psychiatric  History: Denies any family hx of mental illness or substance abuse issues.  Tobacco Screening: Have you used any form of tobacco in the last 30 days? (Cigarettes, Smokeless Tobacco, Cigars, and/or Pipes): Yes Tobacco use, Select all that apply: 5 or more cigarettes per day Are you interested in Tobacco Cessation Medications?: No, patient refused Counseled patient on smoking cessation including  recognizing danger situations, developing coping skills and basic information about quitting provided: Refused/Declined practical counseling  Social History:  History  Alcohol Use No     History  Drug Use  . Frequency: 7.0 times per week  . Types: Marijuana, Methamphetamines    Comment: stopped marijuana in Aug 2017    Additional Social History:  Allergies:   Allergies  Allergen Reactions  . Ceclor [Cefaclor] Other (See Comments)    Does not recall, was a small child    Lab Results:  Results for orders placed or performed during the hospital encounter of 07/08/16 (from the past 48 hour(s))  Comprehensive metabolic panel     Status: None   Collection Time: 07/08/16  2:08 PM  Result Value Ref Range   Sodium 140 135 - 145 mmol/L   Potassium 3.9 3.5 - 5.1 mmol/L   Chloride 104 101 - 111 mmol/L   CO2 29 22 - 32 mmol/L   Glucose, Bld 96 65 - 99 mg/dL   BUN 16 6 - 20 mg/dL   Creatinine, Ser 0.96 0.61 - 1.24 mg/dL   Calcium 9.2 8.9 - 10.3 mg/dL   Total Protein 7.6 6.5 -  8.1 g/dL   Albumin 4.2 3.5 - 5.0 g/dL   AST 22 15 - 41 U/L   ALT 18 17 - 63 U/L   Alkaline Phosphatase 48 38 - 126 U/L   Total Bilirubin 0.7 0.3 - 1.2 mg/dL   GFR calc non Af Amer >60 >60 mL/min   GFR calc Af Amer >60 >60 mL/min    Comment: (NOTE) The eGFR has been calculated using the CKD EPI equation. This calculation has not been validated in all clinical situations. eGFR's persistently <60 mL/min signify possible Chronic Kidney Disease.    Anion gap 7 5 - 15  Ethanol     Status: None   Collection Time: 07/08/16  2:08 PM  Result Value Ref Range   Alcohol, Ethyl (B) <5 <5 mg/dL    Comment:        LOWEST DETECTABLE LIMIT FOR SERUM ALCOHOL IS 5 mg/dL FOR MEDICAL PURPOSES ONLY   cbc     Status: None   Collection Time: 07/08/16  2:08 PM  Result Value Ref Range   WBC 7.5 4.0 - 10.5 K/uL   RBC 4.67 4.22 - 5.81 MIL/uL   Hemoglobin 14.0 13.0 - 17.0 g/dL   HCT 40.7 39.0 - 52.0 %   MCV 87.2 78.0 - 100.0  fL   MCH 30.0 26.0 - 34.0 pg   MCHC 34.4 30.0 - 36.0 g/dL   RDW 13.2 11.5 - 15.5 %   Platelets 335 150 - 400 K/uL  Rapid urine drug screen (hospital performed)     Status: Abnormal   Collection Time: 07/08/16  2:08 PM  Result Value Ref Range   Opiates NONE DETECTED NONE DETECTED   Cocaine NONE DETECTED NONE DETECTED   Benzodiazepines NONE DETECTED NONE DETECTED   Amphetamines POSITIVE (A) NONE DETECTED   Tetrahydrocannabinol POSITIVE (A) NONE DETECTED   Barbiturates NONE DETECTED NONE DETECTED    Comment:        DRUG SCREEN FOR MEDICAL PURPOSES ONLY.  IF CONFIRMATION IS NEEDED FOR ANY PURPOSE, NOTIFY LAB WITHIN 5 DAYS.        LOWEST DETECTABLE LIMITS FOR URINE DRUG SCREEN Drug Class       Cutoff (ng/mL) Amphetamine      1000 Barbiturate      200 Benzodiazepine   038 Tricyclics       882 Opiates          300 Cocaine          300 THC              50   Blood culture (routine x 2)     Status: None (Preliminary result)   Collection Time: 07/08/16  4:02 PM  Result Value Ref Range   Specimen Description BLOOD LEFT ANTECUBITAL    Special Requests BOTTLES DRAWN AEROBIC AND ANAEROBIC 5CC    Culture      NO GROWTH < 24 HOURS Performed at [facility], [place] [address] [place], [place] [address]    Report Status PENDING   Blood culture (routine x 2)     Status: None (Preliminary result)   Collection Time: 07/08/16  4:07 PM  Result Value Ref Range   Specimen Description BLOOD RIGHT ANTECUBITAL    Special Requests BOTTLES DRAWN AEROBIC AND ANAEROBIC 5CC    Culture      NO GROWTH < 24 HOURS Performed at [facility], [place] [address] [place], [place] [address]    Report Status PENDING  Blood Alcohol level:  Lab Results  Component Value Date   ETH <5 07/08/2016   ETH <5 55/97/4163   Metabolic Disorder Labs:  No results found for: HGBA1C, MPG No results found for: PROLACTIN Lab Results  Component Value Date   CHOL 125 01/10/2016   TRIG 66.0 01/10/2016   HDL  55.40 01/10/2016   CHOLHDL 2 01/10/2016   VLDL 13.2 01/10/2016   LDLCALC 57 01/10/2016   Current Medications: Current Facility-Administered Medications  Medication Dose Route Frequency Provider Last Rate Last Dose  . acetaminophen (TYLENOL) tablet 650 mg  650 mg Oral Q6H PRN Derrill Center, NP      . cloNIDine (CATAPRES) tablet 0.1 mg  0.1 mg Oral BH-qamhs Derrill Center, NP       Followed by  . [START ON 07/13/2016] cloNIDine (CATAPRES) tablet 0.1 mg  0.1 mg Oral QAC breakfast Derrill Center, NP      . dicyclomine (BENTYL) tablet 20 mg  20 mg Oral Q6H PRN Derrill Center, NP      . gabapentin (NEURONTIN) capsule 600 mg  600 mg Oral TID Derrill Center, NP   600 mg at 07/10/16 1157  . hydrOXYzine (ATARAX/VISTARIL) tablet 25 mg  25 mg Oral Q6H PRN Derrill Center, NP      . loperamide (IMODIUM) capsule 2-4 mg  2-4 mg Oral PRN Derrill Center, NP      . magnesium hydroxide (MILK OF MAGNESIA) suspension 30 mL  30 mL Oral Daily PRN Derrill Center, NP      . methocarbamol (ROBAXIN) tablet 500 mg  500 mg Oral Q8H PRN Derrill Center, NP   500 mg at 07/09/16 2116  . naproxen (NAPROSYN) tablet 500 mg  500 mg Oral BID PRN Derrill Center, NP      . ondansetron (ZOFRAN-ODT) disintegrating tablet 4 mg  4 mg Oral Q6H PRN Derrill Center, NP      . risperiDONE (RISPERDAL) tablet 0.5 mg  0.5 mg Oral BID Encarnacion Slates, NP      . sulfamethoxazole-trimethoprim (BACTRIM DS,SEPTRA DS) 800-160 MG per tablet 1 tablet  1 tablet Oral BID Derrill Center, NP   1 tablet at 07/10/16 0813  . traZODone (DESYREL) tablet 100 mg  100 mg Oral QHS PRN Derrill Center, NP   100 mg at 07/09/16 2116  . Vilazodone HCl (VIIBRYD) TABS 40 mg  40 mg Oral Q supper Derrill Center, NP       PTA Medications: Facility-Administered Medications Prior to Admission  Medication Dose Route Frequency Provider Last Rate Last Dose  . 0.9 %  sodium chloride infusion  500 mL Intravenous Continuous Milus Banister, MD       Prescriptions Prior to  Admission  Medication Sig Dispense Refill Last Dose  . acetaminophen (TYLENOL) 500 MG tablet Take 500 mg by mouth every 6 (six) hours as needed.   Past Week at Unknown time  . ADDERALL XR 30 MG 24 hr capsule Take 30 mg by mouth every morning.  0 Past Week at Unknown time  . ALPRAZolam (XANAX) 1 MG tablet Take 1 mg by mouth at bedtime as needed for anxiety.   Past Week at Unknown time  . amphetamine-dextroamphetamine (ADDERALL) 30 MG tablet Take 30 mg by mouth daily.  0   . citalopram (CELEXA) 40 MG tablet Take 1 tablet (40 mg total) by mouth daily. (Patient not taking: Reported on 05/08/2016) 30 tablet 0 Not Taking  at Unknown time  . ibuprofen (ADVIL,MOTRIN) 200 MG tablet Take 400 mg by mouth.    Past Week at Unknown time  . ondansetron (ZOFRAN) 4 MG tablet Take 1 tablet (4 mg total) by mouth every 6 (six) hours. (Patient taking differently: Take 4 mg by mouth every 8 (eight) hours as needed for nausea. ) 40 tablet 0 Past Week at Unknown time  . pantoprazole (PROTONIX) 40 MG tablet Take 1 tab every morning. (Patient not taking: Reported on 05/08/2016) 90 tablet 3 Not Taking at Unknown time  . VIIBRYD 40 MG TABS Take 40 mg by mouth 1 day or 1 dose.  12 Past Week at Unknown time   Musculoskeletal: Strength & Muscle Tone: within normal limits Gait & Station: normal Patient leans: Right  Psychiatric Specialty Exam: Physical Exam  Constitutional: He is oriented to person, place, and time. He appears well-developed.  HENT:  Head: Normocephalic.  Eyes: Pupils are equal, round, and reactive to light.  Neck: Normal range of motion.  Cardiovascular: Normal rate.   Respiratory: Effort normal.  GI: Soft.  Genitourinary:  Genitourinary Comments: Deferred  Musculoskeletal: Normal range of motion.  Neurological: He is alert and oriented to person, place, and time.  Skin: Skin is warm and dry.    Review of Systems  Constitutional: Positive for chills and malaise/fatigue.  HENT: Negative.   Eyes:  Negative.   Respiratory: Negative.   Cardiovascular: Negative.   Gastrointestinal: Positive for nausea.  Genitourinary: Negative.   Musculoskeletal: Positive for myalgias.  Skin: Negative.   Neurological: Negative.   Endo/Heme/Allergies: Negative.   Psychiatric/Behavioral: Positive for depression and substance abuse (UDS (+) for Amphetamine & THC.). Negative for hallucinations, memory loss and suicidal ideas. The patient is nervous/anxious and has insomnia.     Blood pressure 108/64, pulse 71, temperature 97.6 F (36.4 C), temperature source Oral, resp. rate 18, height 5' 7"  (1.702 m), weight 72.6 kg (160 lb).Body mass index is 25.06 kg/m.  General Appearance: Disheveled, tearful  Eye Contact:  Fair  Speech:  Clear and Coherent  Volume:  Normal  Mood:  Anxious and Depressed  Affect:  Tearful  Thought Process:  Coherent  Orientation:  Full (Time, Place, and Person)  Thought Content:  Denies any hallucinations, delusional thinking or paranoia.  Suicidal Thoughts:  Currently denies any thoughts, plans or intent.  Homicidal Thoughts:  Currently denies any thoughts, plans or intent.  Memory:  Immediate;   Good Recent;   Good Remote;   Good  Judgement:  Fair  Insight:  Present  Psychomotor Activity:  Restlessness  Concentration:  Concentration: Fair and Attention Span: Fair  Recall:  [employer] of Knowledge:  Fair  Language:  Good  Akathisia:  Negative  Handed:  Right  AIMS (if indicated):     Assets:  Communication Skills Desire for Improvement  ADL's:  Intact  Cognition:  WNL  Sleep:  Number of Hours: 5.75   Treatment Plan/Recommendations: 1. Admit for crisis management and stabilization, estimated length of stay 3-5 days.  2. Medication management to reduce current symptoms to base line and improve the patient's overall level of functioning: Will continue current plan of care in progress.  Will initiate Risperdal 0.5 mg bid for mood control.   3. Treat health problems as  indicated.  4. Develop treatment plan to decrease risk of relapse upon discharge and the need for readmission.  5. Psycho-social education regarding relapse prevention and self care.  6. Health care follow up as needed for  medical problems.  7. Review, reconcile, and reinstate any pertinent home medications for other health issues where appropriate. 8. Call for consults with hospitalist for any additional specialty patient care services as needed.  Observation Level/Precautions:  15 minute checks  Laboratory:  Per ED, UDS (+) for Amphetamine & THC  Psychotherapy: Group sessions   Medications: See [facility]   Consultations: As needed    Discharge Concerns: Maintaining sobriety, mood stability.    Estimated LOS: 2-4 days  Other: Admit to the [facility].    Physician Treatment Plan for Primary Diagnosis: Will initiate medication management for mood stability. Set up an outpatient psychiatric services for medication management. Will encourage medication adherence with psychiatric medications.  Long Term Goal(s): Improvement in symptoms so as ready for discharge  Short Term Goals: Ability to identify changes in lifestyle to reduce recurrence of condition will improve, Ability to verbalize feelings will improve and Ability to demonstrate self-control will improve  Physician Treatment Plan for Secondary Diagnosis: Active Problems:   MDD (major depressive disorder)   Amphetamine use disorder, severe (HCC)  Long Term Goal(s): Improvement in symptoms so as ready for discharge  Short Term Goals: Ability to verbalize feelings will improve, Ability to identify and develop effective coping behaviors will improve, Compliance with prescribed medications will improve and Ability to identify triggers associated with substance abuse/mental health issues will improve  I certify that inpatient services furnished can reasonably be expected to improve the patient's condition.    Encarnacion Slates, NP, PMHNP,  FNP-BC 3/27/201812:55 PM

## 2016-07-11 MED ORDER — NICOTINE 21 MG/24HR TD PT24
21.0000 mg | MEDICATED_PATCH | Freq: Every day | TRANSDERMAL | Status: DC
  Administered 2016-07-11 – 2016-07-12 (×2): 21 mg via TRANSDERMAL
  Filled 2016-07-11 (×7): qty 1

## 2016-07-11 NOTE — Plan of Care (Signed)
Problem: Safety: Goal: Periods of time without injury will increase Outcome: Progressing Pt has not harmed self or others tonight.  He denies SI/HI and verbally contracts for safety.   

## 2016-07-11 NOTE — Progress Notes (Signed)
Recreation Therapy Notes  Date: 07/11/16 Time: 0930 Location: [facility] Dayroom  Group Topic: Stress Management  Goal Area(s) Addresses:  Patient will verbalize importance of using healthy stress management.  Patient will identify positive emotions associated with healthy stress management.   Intervention: Stress Management  Activity :  Body Scan Meditation.  LRT introduced the stress management technique of meditation.  LRT played a meditation that allowed patients to focus on the feelings and sensations they were feeling.  Patients were to follow along as the meditation played to engage in the activity.  Education:  Stress Management, Discharge Planning.   Education Outcome: Acknowledges edcuation/In group clarification offered/Needs additional education  Clinical Observations/Feedback: Pt did not attend group.   Victorino Sparrow, LRT/CTRS         Victorino Sparrow A 07/11/2016 12:19 PM

## 2016-07-11 NOTE — Progress Notes (Addendum)
D: Pt was in bed in his room upon initial approach.  Pt presents with depressed affect and mood.  He states "I don't feel real good."  Pt's goal is to "feel better."  Pt reports he "went to a couple groups" today.  He complains of "sweats" and pain from generalized aches and spasms of 5/10.  Pt denies SI/HI, denies hallucinations.  Pt stayed in his room for the majority of the evening.    A: Introduced self to pt.  Actively listened to pt and offered support and encouragement. Medications administered per order.  PRN medication administered for muscle spasms and sleep.  Q15 minute safety checks maintained.  R: Pt is safe on the unit.  Pt is compliant with medications.  Pt verbally contracts for safety.  Will continue to monitor and assess.

## 2016-07-11 NOTE — [facility] Group Notes (Signed)
[place] LCSW Group Therapy  07/11/2016 12:46 PM  Type of Therapy:  Group Therapy  Participation Level:  Active  Participation Quality:  Attentive  Affect:  Appropriate  Cognitive:  Alert and Oriented  Insight:  Improving  Engagement in Therapy:  Improving  Modes of Intervention:  Confrontation, Discussion, Education, Problem-solving, Socialization and Support  Summary of Progress/Problems: Today's Topic: Overcoming Obstacles. Patients identified one short term goal and potential obstacles in reaching this goal. Patients processed barriers involved in overcoming these obstacles. Patients identified steps necessary for overcoming these obstacles and explored motivation (internal and external) for facing these difficulties head on.   Poonam Woehrle N Smart LCSW 07/11/2016, 12:46 PM

## 2016-07-11 NOTE — Progress Notes (Signed)
D: Patient reports severe withdrawal symptoms: nausea, stomach cramps, restless legs, joint and muscle pain. Talked about taking suboxone in the past and wanting to be off of drugs 100% stated that being on suboxone kept him "in the drug life". A: Medications discussed and given, including prn's for withdrawal related discomfort. Encouraged patient to take frequent hot showers and increase fluid intake, gave patient a pitcher of ice water. R: Patient spending more time in the milieu, reports some symptom improvement.

## 2016-07-11 NOTE — Progress Notes (Signed)
Pt did not attend NA meeting this evening.

## 2016-07-11 NOTE — Plan of Care (Signed)
Problem: Safety: Goal: Periods of time without injury will increase Outcome: Progressing Remains a low fall risk, denies SI/HI/AVH at this time, Q 15 checks in place.

## 2016-07-11 NOTE — Progress Notes (Signed)
Pt has been accepted to [facility] [place] for Saturday per [facility] in admissions. Pt made aware that he will be unable to continue his Neurontin at that facility and states "that's okay I don't need it." Pt requested transportation. Driver will pick him up at 11:00AM on Saturday. CSW confirmed that pt can arrive with prescriptions and they will be filled once at facility. CSW and pt printed out pt's insurance card--pt given copy to bring with him to facility per their request. MD and treatment team made aware of pt's acceptance to facility for Saturday.   Maxie Better, MSW, LCSW Clinical Social Worker 07/11/2016 2:12 PM

## 2016-07-11 NOTE — Progress Notes (Signed)
  DATA ACTION RESPONSE  Objective- Pt. is up and visible in the milieu, seen  watching TV quietly. Pt. presents with an anxious/pained affect and mood. Subjective- Denies having any SI/HI/AVH/ at this time. Rates pain 4/10; generalized. Pt. states " I didn't have a good day; having some bad withdrawal symptoms".  Pt. continues to be cooperative and remain safe & pleasant on the unit.  1:1 interaction in private to establish rapport. Encouragement, education, & support given from staff. Meds. ordered and administered. PRN Trazodone, Robaxin, and Naproxen requested and will re-eval accordingly. BP/HR WNL. See flow sheet.   Safety maintained with Q 15 checks. Continues to follow treatment plan and will monitor closely. No additonal questions/concerns noted.

## 2016-07-11 NOTE — Progress Notes (Signed)
[facility] MD Progress Note  07/11/2016 3:56 PM [name]  MRN:  [id number] Subjective:   36 yo Caucasian male, single, lives with is fiancee and their three yo daughter.  Background history of SUD. Self presented requesting for detox. Reports daily use of cocaine and methamphetamine. UDS was positive for methamphetamine and THC. Patient was very tearful and distressed at presentation. He reports inability to sleep in days. He feels restless and depressed. Patient reports crawling sensation all over. He expressed thoughts of suicide if he does not get help. Patient has been managed here in the past for substance related issues. Says he was sober but relapsed about a month ago. His girlfriend has decided to end their relationship because of his drug use.  Nursing staff reports that he is still in active withdrawals. He has been taking PRN's. Minimal engagement with unit activities. He has not expressed any futility thoughts. He is still committed towards winning back his family.  SW spoke with his family today. His girlfriend is supportive and wants him to get help. Patient has been accepted into an inpatient program.   At interview, he reports aches and pains. Says he is still feeling sick. Was on the phone with his girlfriend a few minutes ago. Disappointed with himself for relapsing. Patient is committed towards rehab. No tactile hallucinations lately. No other form of hallucination. No suicidal thoughts. No thoughts of violence. Patient is future oriented.    Principal Problem: <principal problem not specified> Diagnosis:   Patient Active Problem List   Diagnosis Date Noted  . 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 Polysubstance (including opioids) dependence with physiological dependence ([place]) [F19.20] 07/09/2016  . MDD (major depressive disorder) [F32.9] 07/09/2016  . Encounter for well adult exam with abnormal findings [V03.50] 12/20/2015  . Bruises easily  [R23.8] 12/20/2015  . Squamous cell carcinoma [C44.92] 05/19/2015  . Itching [L29.9] 05/19/2015  . Major depressive disorder, recurrent, severe without psychotic features ([place]) [F33.2]   . Anxiety disorder [F41.9] 12/05/2014  . Major depressive disorder, recurrent episode, severe ([place]) [F33.2] 12/05/2014  . 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 Anemia   . Anxiety   . Arthritis   . Depression   . Hepatitis C   . Kidney stone   . Opiate dependence ([place])     Past Surgical History:  Procedure Laterality Date  . APPENDECTOMY    . left axillary node biopsy    . RADICAL NECK DISSECTION Left   . TONSILLECTOMY     Family History:  Family History  Problem Relation Age of Onset  . Kidney cancer Father     renal cell carcenoma  . Hodgkin's lymphoma Mother   . Colon cancer Paternal Grandfather     great  . Lung cancer Paternal Grandmother   . Diabetes Maternal Grandmother   . Pancreatic cancer Maternal Grandmother   . Melanoma Maternal Grandfather    Family Psychiatric  History: As in H&P  Social History:  History  Alcohol Use No     History  Drug Use  . Frequency: 7.0 times per week  . Types: Marijuana, Methamphetamines    Comment: stopped marijuana in Aug 2017    Social History   Social History  . Marital status: Single    Spouse name: N/A  . Number of children: 1  . Years  of education: 14   Occupational History  . Mechanic     Social History Main Topics  . Smoking status: Current Some Day Smoker    Packs/day: 0.50    Years: 9.00    Types: Cigarettes  . Smokeless tobacco: Former Systems developer    Types: Chew    Quit date: 04/16/2008  . Alcohol use No  . Drug use: Yes    Frequency: 7.0 times per week    Types: Marijuana, Methamphetamines     Comment: stopped marijuana in Aug 2017  . Sexual activity: Not Asked   Other  Topics Concern  . None   Social History Narrative   Fun: Dietitian and hang out with his daughter   Additional Social History:         Sleep: Good  Appetite:  Good  Current Medications: Current Facility-Administered Medications  Medication Dose Route Frequency Provider Last Rate Last Dose  . acetaminophen (TYLENOL) tablet 650 mg  650 mg Oral Q6H PRN Derrill Center, NP      . cloNIDine (CATAPRES) tablet 0.1 mg  0.1 mg Oral BH-qamhs Derrill Center, NP   0.1 mg at 07/11/16 7169   Followed by  . [START ON 07/13/2016] cloNIDine (CATAPRES) tablet 0.1 mg  0.1 mg Oral QAC breakfast Derrill Center, NP      . dicyclomine (BENTYL) tablet 20 mg  20 mg Oral Q6H PRN Derrill Center, NP   20 mg at 07/11/16 0924  . gabapentin (NEURONTIN) capsule 600 mg  600 mg Oral TID Derrill Center, NP   600 mg at 07/11/16 1331  . hydrOXYzine (ATARAX/VISTARIL) tablet 25 mg  25 mg Oral Q6H PRN Derrill Center, NP      . loperamide (IMODIUM) capsule 2-4 mg  2-4 mg Oral PRN Derrill Center, NP      . magnesium hydroxide (MILK OF MAGNESIA) suspension 30 mL  30 mL Oral Daily PRN Derrill Center, NP      . methocarbamol (ROBAXIN) tablet 500 mg  500 mg Oral Q8H PRN Derrill Center, NP   500 mg at 07/11/16 0924  . naproxen (NAPROSYN) tablet 500 mg  500 mg Oral BID PRN Derrill Center, NP   500 mg at 07/11/16 0924  . nicotine (NICODERM CQ - dosed in mg/24 hours) patch 21 mg  21 mg Transdermal Daily Jenne Campus, MD   21 mg at 07/11/16 1331  . ondansetron (ZOFRAN-ODT) disintegrating tablet 4 mg  4 mg Oral Q6H PRN Derrill Center, NP   4 mg at 07/11/16 0924  . risperiDONE (RISPERDAL) tablet 0.5 mg  0.5 mg Oral BID Encarnacion Slates, NP   0.5 mg at 07/11/16 6789  . sulfamethoxazole-trimethoprim (BACTRIM DS,SEPTRA DS) 800-160 MG per tablet 1 tablet  1 tablet Oral BID Derrill Center, NP   1 tablet at 07/11/16 0920  . traZODone (DESYREL) tablet 100 mg  100 mg Oral QHS PRN Derrill Center, NP   100 mg at 07/10/16 2109  . Vilazodone HCl  (VIIBRYD) TABS 40 mg  40 mg Oral Q supper Derrill Center, NP   40 mg at 07/10/16 1723    Lab Results: No results found for this or any previous visit (from the past 48 hour(s)).  Blood Alcohol level:  Lab Results  Component Value Date   ETH <5 07/08/2016   ETH <5 38/01/1750    Metabolic Disorder Labs: No results found for: HGBA1C, MPG No results found  for: PROLACTIN Lab Results  Component Value Date   CHOL 125 01/10/2016   TRIG 66.0 01/10/2016   HDL 55.40 01/10/2016   CHOLHDL 2 01/10/2016   VLDL 13.2 01/10/2016   LDLCALC 57 01/10/2016    Physical Findings: AIMS: Facial and Oral Movements Muscles of Facial Expression: None, normal Lips and Perioral Area: None, normal Jaw: None, normal Tongue: None, normal,Extremity Movements Upper (arms, wrists, hands, fingers): None, normal Lower (legs, knees, ankles, toes): None, normal, Trunk Movements Neck, shoulders, hips: None, normal, Overall Severity Severity of abnormal movements (highest score from questions above): None, normal Incapacitation due to abnormal movements: None, normal Patient's awareness of abnormal movements (rate only patient's report): No Awareness, Dental Status Current problems with teeth and/or dentures?: No Does patient usually wear dentures?: No  CIWA:  CIWA-Ar Total: 1 COWS:  COWS Total Score: 8  Musculoskeletal: Strength & Muscle Tone: within normal limits Gait & Station: normal Patient leans: N/A  Psychiatric Specialty Exam: Physical Exam  Constitutional: He is oriented to person, place, and time. He appears well-developed and well-nourished.  HENT:  Head: Normocephalic and atraumatic.  Eyes: Conjunctivae are normal. Pupils are equal, round, and reactive to light.  Neck: Normal range of motion. Neck supple.  Cardiovascular: Normal rate, regular rhythm and normal heart sounds.   Respiratory: Effort normal and breath sounds normal.  GI: Soft. Bowel sounds are normal.  Musculoskeletal: Normal  range of motion.  Neurological: He is alert and oriented to person, place, and time.  Skin: Skin is warm and dry.  Psychiatric:  As above.    ROS  Blood pressure 109/84, pulse 98, temperature 98.4 F (36.9 C), temperature source Oral, resp. rate 18, height 5\' 7"  (1.702 m), weight 72.6 kg (160 lb).Body mass index is 25.06 kg/m.  General Appearance: In distress, withdrawn, minimal engagement  Eye Contact:  Minimal  Speech:  Slow  Volume:  Decreased  Mood:  Depressed and Dysphoric  Affect:  Blunted  Thought Process:  Linear  Orientation:  Full (Time, Place, and Person)  Thought Content:  Rumination  Suicidal Thoughts:  No  Homicidal Thoughts:  No  Memory:  Unable to assess at this time  Judgement:  Impaired  Insight:  Good  Psychomotor Activity:  Decreased  Concentration:  Impaired   Recall:  [employer] of Knowledge:  Fair  Language:  Good  Akathisia:  No  Handed:    AIMS (if indicated):     Assets:  Desire for Improvement Physical Health Resilience  ADL's:  Impaired  Cognition:  Impaired,  Mild  Sleep:  Number of Hours: 6     Treatment Plan Summary: Patient is still coming off substances. We are still evaluating him.    Psychiatric: SUD ( THC, cocaine, methamphetamine, opiates) MDD Substance Induced Psychotic Disorder  Medical:  Psychosocial:  Recent end of his relationship  PLAN: 1. Continue current regimen 2. Monitor mood, behavior and interaction with peers 3. Encourage unit groups and activities.   Artist Beach, MD 07/11/2016, 3:56 PM

## 2016-07-12 MED ORDER — RISPERIDONE 0.5 MG PO TABS
0.5000 mg | ORAL_TABLET | Freq: Every day | ORAL | Status: DC
  Administered 2016-07-14: 0.5 mg via ORAL
  Filled 2016-07-12 (×4): qty 1

## 2016-07-12 MED ORDER — RISPERIDONE 1 MG PO TABS
1.0000 mg | ORAL_TABLET | Freq: Every day | ORAL | Status: DC
  Administered 2016-07-12: 1 mg via ORAL
  Filled 2016-07-12 (×4): qty 1

## 2016-07-12 NOTE — BHH Group Notes (Signed)
[place] LCSW Group Therapy  07/12/2016 1:44 PM  Type of Therapy:  Group Therapy  Participation Level:  Did Not Attend-pt invited. Chose to remain in bed.   Summary of Progress/Problems:  Finding Balance in Life. Today's group focused on defining balance in one's own words, identifying things that can knock one off balance, and exploring healthy ways to maintain balance in life. Group members were asked to provide an example of a time when they felt off balance, describe how they handled that situation,and process healthier ways to regain balance in the future. Group members were asked to share the most important tool for maintaining balance that they learned while at [facility] and how they plan to apply this method after discharge.   Christoffer Currier N Smart LCSW 07/12/2016, 1:44 PM

## 2016-07-12 NOTE — Progress Notes (Signed)
Patient ID: [name], male   DOB: 02/02/1981, 36 y.o.   MRN: [id number]   Pt currently presents with a flat affect and cooperative, isolative behavior. Pt does not attend karaoke tonight. Endorses signs and symptoms of withdrawal including body chills, muscle aches and nausea/vomiting. Pt wishes to be discharged to a long term facility. Pt reports poor sleep with current medication regimen last night.   Pt provided with medications per providers orders. Pt's labs and vitals were monitored throughout the night. Pt given a 1:1 about emotional and mental status. Pt supported and encouraged to express concerns and questions. Pt educated on medications.  Pt's safety ensured with 15 minute and environmental checks. Pt currently denies SI/HI and A/V hallucinations. Pt verbally agrees to seek staff if SI/HI or A/VH occurs and to consult with staff before acting on any harmful thoughts. Will continue POC.

## 2016-07-12 NOTE — Progress Notes (Signed)
[facility] MD Progress Note  07/12/2016 2:36 PM [name]  MRN:  [id number] Subjective:   36 yo Caucasian male, single, lives with is fiancee and their three yo daughter.  Background history of SUD. Self presented requesting for detox. Reports daily use of cocaine and methamphetamine. UDS was positive for methamphetamine and THC. Patient was very tearful and distressed at presentation. He reports inability to sleep in days. He feels restless and depressed. Patient reports crawling sensation all over. He expressed thoughts of suicide if he does not get help. Patient has been managed here in the past for substance related issues. Says he was sober but relapsed about a month ago. His girlfriend has decided to end their relationship because of his drug use.  Nursing staff reports that he is a bit withdrawn. Minimal interactions with peers. Has not engaged in some of the unit activities. He has not been observed to be internally stimulated. He has repeatedly denied suicidal thoughts. No behavioral issues. He has been tolerating his medications. No observed or reported side effects.   Seen today, in bed. States that his eyes and nose are running. He feels tired and drained. Says he is trying to recover enough energy as he would be getting into rehab on Saturday. He has not thrown up today. Aches are less. No hallucinations in nay modality. No thoughts of suicide. Says he is more positive about the future. He plans to go out to the courtyard today and attend more groups.  Principal Problem: <principal problem not specified> Diagnosis:   Patient Active Problem List   Diagnosis Date Noted  . 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 Polysubstance (including opioids) dependence with physiological dependence ([place]) [F19.20] 07/09/2016  . MDD (major depressive disorder) [F32.9] 07/09/2016  . Encounter for well adult exam with abnormal findings [E93.81] 12/20/2015  . Bruises easily  [R23.8] 12/20/2015  . Squamous cell carcinoma [C44.92] 05/19/2015  . Itching [L29.9] 05/19/2015  . Major depressive disorder, recurrent, severe without psychotic features ([place]) [F33.2]   . Anxiety disorder [F41.9] 12/05/2014  . Major depressive disorder, recurrent episode, severe ([place]) [F33.2] 12/05/2014  . 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 Anemia   . Anxiety   . Arthritis   . Depression   . Hepatitis C   . Kidney stone   . Opiate dependence ([place])     Past Surgical History:  Procedure Laterality Date  . APPENDECTOMY    . left axillary node biopsy    . RADICAL NECK DISSECTION Left   . TONSILLECTOMY     Family History:  Family History  Problem Relation Age of Onset  . Kidney cancer Father     renal cell carcenoma  . Hodgkin's lymphoma Mother   . Colon cancer Paternal Grandfather     great  . Lung cancer Paternal Grandmother   . Diabetes Maternal Grandmother   . Pancreatic cancer Maternal Grandmother   . Melanoma Maternal Grandfather    Family Psychiatric  History: As in H&P  Social History:  History  Alcohol Use No     History  Drug Use  . Frequency: 7.0 times per week  . Types: Marijuana, Methamphetamines    Comment: stopped marijuana in Aug 2017    Social History   Social History  . Marital status: Single    Spouse name: N/A  .  Number of children: 1  . Years of education: 50   Occupational History  . Mechanic     Social History Main Topics  . Smoking status: Current Some Day Smoker    Packs/day: 0.50    Years: 9.00    Types: Cigarettes  . Smokeless tobacco: Former Systems developer    Types: Chew    Quit date: 04/16/2008  . Alcohol use No  . Drug use: Yes    Frequency: 7.0 times per week    Types: Marijuana, Methamphetamines     Comment: stopped marijuana in Aug 2017  . Sexual activity: Not Asked   Other  Topics Concern  . None   Social History Narrative   Fun: Dietitian and hang out with his daughter   Additional Social History:         Sleep: Broken at night as he is bed most of the day  Appetite:  Better.   Current Medications: Current Facility-Administered Medications  Medication Dose Route Frequency Provider Last Rate Last Dose  . acetaminophen (TYLENOL) tablet 650 mg  650 mg Oral Q6H PRN Derrill Center, NP      . Derrill Memo ON 07/13/2016] cloNIDine (CATAPRES) tablet 0.1 mg  0.1 mg Oral QAC breakfast Derrill Center, NP      . dicyclomine (BENTYL) tablet 20 mg  20 mg Oral Q6H PRN Derrill Center, NP   20 mg at 07/11/16 0924  . gabapentin (NEURONTIN) capsule 600 mg  600 mg Oral TID Derrill Center, NP   600 mg at 07/12/16 1207  . hydrOXYzine (ATARAX/VISTARIL) tablet 25 mg  25 mg Oral Q6H PRN Derrill Center, NP      . loperamide (IMODIUM) capsule 2-4 mg  2-4 mg Oral PRN Derrill Center, NP      . magnesium hydroxide (MILK OF MAGNESIA) suspension 30 mL  30 mL Oral Daily PRN Derrill Center, NP      . methocarbamol (ROBAXIN) tablet 500 mg  500 mg Oral Q8H PRN Derrill Center, NP   500 mg at 07/11/16 2128  . naproxen (NAPROSYN) tablet 500 mg  500 mg Oral BID PRN Derrill Center, NP   500 mg at 07/12/16 2409  . nicotine (NICODERM CQ - dosed in mg/24 hours) patch 21 mg  21 mg Transdermal Daily Jenne Campus, MD   21 mg at 07/12/16 0824  . ondansetron (ZOFRAN-ODT) disintegrating tablet 4 mg  4 mg Oral Q6H PRN Derrill Center, NP   4 mg at 07/11/16 0924  . risperiDONE (RISPERDAL) tablet 0.5 mg  0.5 mg Oral BID Encarnacion Slates, NP   0.5 mg at 07/12/16 0826  . sulfamethoxazole-trimethoprim (BACTRIM DS,SEPTRA DS) 800-160 MG per tablet 1 tablet  1 tablet Oral BID Derrill Center, NP   1 tablet at 07/12/16 0826  . traZODone (DESYREL) tablet 100 mg  100 mg Oral QHS PRN Derrill Center, NP   100 mg at 07/11/16 2128  . Vilazodone HCl (VIIBRYD) TABS 40 mg  40 mg Oral Q supper Derrill Center, NP   40 mg at  07/11/16 1809    Lab Results: No results found for this or any previous visit (from the past 48 hour(s)).  Blood Alcohol level:  Lab Results  Component Value Date   ETH <5 07/08/2016   ETH <5 73/53/2992    Metabolic Disorder Labs: No results found for: HGBA1C, MPG No results found for: PROLACTIN Lab Results  Component Value Date  CHOL 125 01/10/2016   TRIG 66.0 01/10/2016   HDL 55.40 01/10/2016   CHOLHDL 2 01/10/2016   VLDL 13.2 01/10/2016   LDLCALC 57 01/10/2016    Physical Findings: AIMS: Facial and Oral Movements Muscles of Facial Expression: None, normal Lips and Perioral Area: None, normal Jaw: None, normal Tongue: None, normal,Extremity Movements Upper (arms, wrists, hands, fingers): None, normal Lower (legs, knees, ankles, toes): None, normal, Trunk Movements Neck, shoulders, hips: None, normal, Overall Severity Severity of abnormal movements (highest score from questions above): None, normal Incapacitation due to abnormal movements: None, normal Patient's awareness of abnormal movements (rate only patient's report): No Awareness, Dental Status Current problems with teeth and/or dentures?: No Does patient usually wear dentures?: No  CIWA:  CIWA-Ar Total: 1 COWS:  COWS Total Score: 5  Musculoskeletal: Strength & Muscle Tone: within normal limits Gait & Station: normal Patient leans: N/A  Psychiatric Specialty Exam: Physical Exam  Constitutional: He is oriented to person, place, and time. He appears well-developed and well-nourished.  HENT:  Head: Normocephalic and atraumatic.  Eyes: Conjunctivae are normal. Pupils are equal, round, and reactive to light.  Neck: Normal range of motion. Neck supple.  Cardiovascular: Normal rate, regular rhythm and normal heart sounds.   Respiratory: Effort normal and breath sounds normal.  GI: Soft. Bowel sounds are normal.  Musculoskeletal: Normal range of motion.  Neurological: He is alert and oriented to person,  place, and time.  Skin: Skin is warm and dry.  Psychiatric:  As above.    ROS  Blood pressure 117/85, pulse 90, temperature 97.9 F (36.6 C), temperature source Oral, resp. rate 16, height 5\' 7"  (1.702 m), weight 72.6 kg (160 lb).Body mass index is 25.06 kg/m.  General Appearance: Relatively calmer today, in bed, has a towel over his face. Relates well. Not internally distracted  Eye Contact:  Better  Speech:  More spontaneous  Volume:  Low   Mood:  Dysphoria and irritability are lifting  Affect:  Blunted  Thought Process:  Linear  Orientation:  Full (Time, Place, and Person)  Thought Content:  More future oriented. No hallucination. No delusion. No preoccupation with violent thoughts.   Suicidal Thoughts:  No  Homicidal Thoughts:  No  Memory: did not assess at this time  Judgement:  Better  Insight:  Good  Psychomotor Activity:  Decreased  Concentration:  Impaired   Recall:  [employer] of Knowledge:  Fair  Language:  Good  Akathisia:  No  Handed:    AIMS (if indicated):     Assets:  Desire for Improvement Physical Health Resilience  ADL's:  Impaired  Cognition:  Impaired,  Mild  Sleep:  Number of Hours: 5.45     Treatment Plan Summary: Patient is still coming off substances. Irritability and dysphoria are less. He has been accepted for Saturday. We would adjust his medications today.   Psychiatric: SUD ( THC, cocaine, methamphetamine, opiates) MDD Substance Induced Psychotic Disorder  Medical:  Psychosocial:  Recent end of his relationship  PLAN: 1. Make Risperidone 0.5 mg daily and 1 mg HS 2. Continue to monitor mood, behavior and interaction with peers 3. Encourage unit groups and activities.   Artist Beach, MD 07/12/2016, 2:36 PMPatient ID: [name], male   DOB: [birth year], 36 y.o.   MRN: [id number]

## 2016-07-13 LAB — CULTURE, BLOOD (ROUTINE X 2)
CULTURE: NO GROWTH
CULTURE: NO GROWTH

## 2016-07-13 MED ORDER — ONDANSETRON 8 MG PO TBDP
8.0000 mg | ORAL_TABLET | Freq: Once | ORAL | Status: AC
  Administered 2016-07-13: 8 mg via ORAL
  Filled 2016-07-13: qty 2
  Filled 2016-07-13: qty 1

## 2016-07-13 NOTE — Tx Team (Signed)
Interdisciplinary Treatment and Diagnostic Plan Update  07/13/2016 Time of Session: 0930 Kaushik Maul MRN: [id number]  Principal Diagnosis: MDD severe  Secondary Diagnoses: Active Problems:   MDD (major depressive disorder)   Amphetamine use disorder, severe (HCC)   Current Medications:  Current Facility-Administered Medications  Medication Dose Route Frequency Provider Last Rate Last Dose  . acetaminophen (TYLENOL) tablet 650 mg  650 mg Oral Q6H PRN D[place]ill [place], NP   650 mg at 07/13/16 0555  . cloNIDine (CATAPRES) tablet 0.1 mg  0.1 mg Oral QAC breakfast Derrill Center, NP   0.1 mg at 07/13/16 0555  . dicyclomine (BENTYL) tablet 20 mg  20 mg Oral Q6H PRN Derrill Center, NP   20 mg at 07/11/16 0924  . gabapentin (NEURONTIN) capsule 600 mg  600 mg Oral TID Derrill Center, NP   600 mg at 07/12/16 1813  . hydrOXYzine (ATARAX/VISTARIL) tablet 25 mg  25 mg Oral Q6H PRN Derrill Center, NP   25 mg at 07/12/16 2157  . loperamide (IMODIUM) capsule 2-4 mg  2-4 mg Oral PRN Derrill Center, NP      . magnesium hydroxide (MILK OF MAGNESIA) suspension 30 mL  30 mL Oral Daily PRN Derrill Center, NP      . methocarbamol (ROBAXIN) tablet 500 mg  500 mg Oral Q8H PRN Derrill Center, NP   500 mg at 07/12/16 2157  . naproxen (NAPROSYN) tablet 500 mg  500 mg Oral BID PRN Derrill Center, NP   500 mg at 07/12/16 2157  . nicotine (NICODERM CQ - dosed in mg/24 hours) patch 21 mg  21 mg Transdermal Daily Jenne Campus, MD   21 mg at 07/12/16 0824  . ondansetron (ZOFRAN-ODT) disintegrating tablet 4 mg  4 mg Oral Q6H PRN Derrill Center, NP   4 mg at 07/13/16 0555  . risperiDONE (RISPERDAL) tablet 0.5 mg  0.5 mg Oral Daily Artist Beach, MD      . risperiDONE (RISPERDAL) tablet 1 mg  1 mg Oral QHS Artist Beach, MD   1 mg at 07/12/16 2157  . sulfamethoxazole-trimethoprim (BACTRIM DS,SEPTRA DS) 800-160 MG per tablet 1 tablet  1 tablet Oral BID Derrill Center, NP   1 tablet at 07/12/16 1813  .  traZODone (DESYREL) tablet 100 mg  100 mg Oral QHS PRN Derrill Center, NP   100 mg at 07/12/16 2157  . Vilazodone HCl (VIIBRYD) TABS 40 mg  40 mg Oral Q supper Derrill Center, NP   40 mg at 07/12/16 1813   PTA Medications: Facility-Administered Medications Prior to Admission  Medication Dose Route Frequency Provider Last Rate Last Dose  . 0.9 %  sodium chloride infusion  500 mL Intravenous Continuous Milus Banister, MD       Prescriptions Prior to Admission  Medication Sig Dispense Refill Last Dose  . acetaminophen (TYLENOL) 500 MG tablet Take 500 mg by mouth every 6 (six) hours as needed.   Past Week at Unknown time  . ADDERALL XR 30 MG 24 hr capsule Take 30 mg by mouth every morning.  0 Past Week at Unknown time  . ALPRAZolam (XANAX) 1 MG tablet Take 1 mg by mouth at bedtime as needed for anxiety.   Past Week at Unknown time  . amphetamine-dextroamphetamine (ADDERALL) 30 MG tablet Take 30 mg by mouth daily.  0   . citalopram (CELEXA) 40 MG tablet Take 1 tablet (40 mg total) by  mouth daily. (Patient not taking: Reported on 05/08/2016) 30 tablet 0 Not Taking at Unknown time  . ibuprofen (ADVIL,MOTRIN) 200 MG tablet Take 400 mg by mouth.    Past Week at Unknown time  . ondansetron (ZOFRAN) 4 MG tablet Take 1 tablet (4 mg total) by mouth every 6 (six) hours. (Patient taking differently: Take 4 mg by mouth every 8 (eight) hours as needed for nausea. ) 40 tablet 0 Past Week at Unknown time  . pantoprazole (PROTONIX) 40 MG tablet Take 1 tab every morning. (Patient not taking: Reported on 05/08/2016) 90 tablet 3 Not Taking at Unknown time  . VIIBRYD 40 MG TABS Take 40 mg by mouth 1 day or 1 dose.  12 Past Week at Unknown time    Patient Stressors: Substance abuse  Patient Strengths: Ability for insight Average or above average intelligence Capable of independent living [employer] of knowledge Motivation for treatment/growth  Treatment Modalities: Medication Management, Group therapy, Case  management,  1 to 1 session with clinician, Psychoeducation, Recreational therapy.   Physician Treatment Plan for Primary Diagnosis: MDD severe  Medication Management: Evaluate patient's response, side effects, and tolerance of medication regimen.  Therapeutic Interventions: 1 to 1 sessions, Unit Group sessions and Medication administration.  Evaluation of Outcomes: Met  Physician Treatment Plan for Secondary Diagnosis: Active Problems:   MDD (major depressive disorder)   Amphetamine use disorder, severe (HCC)  Long Term Goal(s): Improvement in symptoms so as ready for discharge Improvement in symptoms so as ready for discharge   Short Term Goals: Ability to identify changes in lifestyle to reduce recurrence of condition will improve Ability to verbalize feelings will improve Ability to demonstrate self-control will improve Ability to verbalize feelings will improve Ability to identify and develop effective coping behaviors will improve Compliance with prescribed medications will improve Ability to identify triggers associated with substance abuse/mental health issues will improve     Medication Management: Evaluate patient's response, side effects, and tolerance of medication regimen.  Therapeutic Interventions: 1 to 1 sessions, Unit Group sessions and Medication administration.  Evaluation of Outcomes: Met   RN Treatment Plan for Primary Diagnosis: MDD severe Long Term Goal(s): Knowledge of disease and therapeutic regimen to maintain health will improve  Short Term Goals: Ability to remain free from injury will improve, Ability to disclose and discuss suicidal ideas and Ability to identify and develop effective coping behaviors will improve  Medication Management: RN will administer medications as ordered by provider, will assess and evaluate patient's response and provide education to patient for prescribed medication. RN will report any adverse and/or side effects to  prescribing provider.  Therapeutic Interventions: 1 on 1 counseling sessions, Psychoeducation, Medication administration, Evaluate responses to treatment, Monitor vital signs and CBGs as ordered, Perform/monitor CIWA, COWS, AIMS and Fall Risk screenings as ordered, Perform wound care treatments as ordered.  Evaluation of Outcomes: Met   LCSW Treatment Plan for Primary Diagnosis: MDD severe Long Term Goal(s): Safe transition to appropriate next level of care at discharge, Engage patient in therapeutic group addressing interpersonal concerns.  Short Term Goals: Engage patient in aftercare planning with referrals and resources, Facilitate patient progression through stages of change regarding substance use diagnoses and concerns and Identify triggers associated with mental health/substance abuse issues  Therapeutic Interventions: Assess for all discharge needs, 1 to 1 time with Social worker, Explore available resources and support systems, Assess for adequacy in community support network, Educate family and significant other(s) on suicide prevention, Complete Psychosocial Assessment, Interpersonal group  therapy.  Evaluation of Outcomes: Met   Progress in Treatment: Attending groups: Yes. Participating in groups: Yes. Taking medication as prescribed: Yes. Toleration medication: Yes. Family/Significant other contact made: SPE completed with pt's fiance.  Patient understands diagnosis: Yes. Discussing patient identified problems/goals with staff: Yes. Medical problems stabilized or resolved: Yes. Denies suicidal/homicidal ideation: yes per his self report.  Issues/concerns per patient self-inventory: No. Other: n/a  New problem(s) identified: No, Describe:  n/a  New Short Term/Long Term Goal(s): detox; medication management; development of comprehensive mental wellness/sobriety plan.   Discharge Plan or Barriers: Pt accepted to [facility] [place] for Saturday 3/31 and is scheduled to  be picked up on Saturday at 11am by facility.   Reason for Continuation of Hospitalization: medication management  Estimated Length of Stay: 1 day (pt scheduled for discharge on Sat at 11am).   Attendees: Patient: 07/13/2016 10:21 AM  Physician: Dr. Sanjuana Letters MD 07/13/2016 10:21 AM  Nursing: Rayburn Felt RN 07/13/2016 10:21 AM  RN Care Manager: Lars Pinks CM 07/13/2016 10:21 AM  Social Worker: Maxie Better, LCSW 07/13/2016 10:21 AM  Recreational Therapist: Rhunette Croft 07/13/2016 10:21 AM  Other: Lindell Spar NP 07/13/2016 10:21 AM  Other:  07/13/2016 10:21 AM  Other: 07/13/2016 10:21 AM    Scribe for Treatment Team: Kimber Relic Smart, LCSW 07/13/2016 10:21 AM

## 2016-07-13 NOTE — Plan of Care (Signed)
Problem: Activity: Goal: Interest or engagement in leisure activities will improve Outcome: Not Progressing Patient has attended no unit activities and has isolated in his room all day.

## 2016-07-13 NOTE — Progress Notes (Signed)
[facility] MD Progress Note  07/13/2016 5:07 PM [name]  MRN:  [id number] Subjective:   36 yo Caucasian male, single, lives with is fiancee and their three yo daughter.  Background history of SUD. Self presented requesting for detox. Reports daily use of cocaine and methamphetamine. UDS was positive for methamphetamine and THC. Patient was very tearful and distressed at presentation. He reports inability to sleep in days. He feels restless and depressed. Patient reports crawling sensation all over. He expressed thoughts of suicide if he does not get help. Patient has been managed here in the past for substance related issues. Says he was sober but relapsed about a month ago. His girlfriend has decided to end their relationship because of his drug use.  Nursing staff reports he had severe headaches. He has been isolating self in his room. He responded well to sublingual Zofran and Naproxen. He has been up and about there after.  SW reports that he has been accepted. He is scheduled for discharge tomorrow.  At interview, he is pleasant. Says his girlfriend is visiting tonight. She would bring him clothes. He has been speaking to his family and says they are supportive. No suicidal thoughts. No thoughts of violence. No psychosis. Says the headaches has resolved. He feels ready for rehab.   Principal Problem: Substance Induced Mood Disorder Diagnosis:   Patient Active Problem List   Diagnosis Date Noted  . Amphetamine use disorder, severe ([place]) [F15.20] 07/10/2016  . Polysubstance (including opioids) dependence with physiological dependence ([place]) [F19.20] 07/09/2016  . MDD (major depressive disorder) [F32.9] 07/09/2016  . Encounter for well adult exam with abnormal findings [V95.63] 12/20/2015  . Bruises easily [R23.8] 12/20/2015  . Squamous cell carcinoma [C44.92] 05/19/2015  . Itching [L29.9] 05/19/2015  . Major depressive disorder, recurrent, severe without psychotic features ([place])  [F33.2]   . Anxiety disorder [F41.9] 12/05/2014  . Major depressive disorder, recurrent episode, severe ([place]) [F33.2] 12/05/2014  . 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 Anemia   . Anxiety   . Arthritis   . Depression   . Hepatitis C   . Kidney stone   . Opiate dependence ([place])     Past Surgical History:  Procedure Laterality Date  . APPENDECTOMY    . left axillary node biopsy    . RADICAL NECK DISSECTION Left   . TONSILLECTOMY     Family History:  Family History  Problem Relation Age of Onset  . Kidney cancer Father     renal cell carcenoma  . Hodgkin's lymphoma Mother   . Colon cancer Paternal Grandfather     great  . Lung cancer Paternal Grandmother   . Diabetes Maternal Grandmother   . Pancreatic cancer Maternal Grandmother   . Melanoma Maternal Grandfather    Family Psychiatric  History: As in H&P  Social History:  History  Alcohol Use No     History  Drug Use  . Frequency: 7.0 times per week  . Types: Marijuana, Methamphetamines    Comment: stopped marijuana in Aug 2017    Social History   Social History  . Marital status: Single    Spouse name: N/A  . Number of children: 1  . Years of education: 52   Occupational History  . Mechanic     Social History Main Topics  . Smoking status: Current Some Day  Smoker    Packs/day: 0.50    Years: 9.00    Types: Cigarettes  . Smokeless tobacco: Former Systems developer    Types: Chew    Quit date: 04/16/2008  . Alcohol use No  . Drug use: Yes    Frequency: 7.0 times per week    Types: Marijuana, Methamphetamines     Comment: stopped marijuana in Aug 2017  . Sexual activity: Not Asked   Other Topics Concern  . None   Social History Narrative   Fun: Dietitian and hang out with his daughter   Additional Social History:         Sleep: Broken at night as he is bed  most of the day  Appetite:  Better.   Current Medications: Current Facility-Administered Medications  Medication Dose Route Frequency Provider Last Rate Last Dose  . acetaminophen (TYLENOL) tablet 650 mg  650 mg Oral Q6H PRN Derrill Center, NP   650 mg at 07/13/16 0555  . cloNIDine (CATAPRES) tablet 0.1 mg  0.1 mg Oral QAC breakfast Derrill Center, NP   0.1 mg at 07/13/16 0555  . dicyclomine (BENTYL) tablet 20 mg  20 mg Oral Q6H PRN Derrill Center, NP   20 mg at 07/11/16 0924  . gabapentin (NEURONTIN) capsule 600 mg  600 mg Oral TID Derrill Center, NP   600 mg at 07/13/16 1150  . hydrOXYzine (ATARAX/VISTARIL) tablet 25 mg  25 mg Oral Q6H PRN Derrill Center, NP   25 mg at 07/12/16 2157  . loperamide (IMODIUM) capsule 2-4 mg  2-4 mg Oral PRN Derrill Center, NP      . magnesium hydroxide (MILK OF MAGNESIA) suspension 30 mL  30 mL Oral Daily PRN Derrill Center, NP      . methocarbamol (ROBAXIN) tablet 500 mg  500 mg Oral Q8H PRN Derrill Center, NP   500 mg at 07/12/16 2157  . naproxen (NAPROSYN) tablet 500 mg  500 mg Oral BID PRN Derrill Center, NP   500 mg at 07/13/16 1149  . nicotine (NICODERM CQ - dosed in mg/24 hours) patch 21 mg  21 mg Transdermal Daily Jenne Campus, MD   21 mg at 07/12/16 0824  . ondansetron (ZOFRAN-ODT) disintegrating tablet 4 mg  4 mg Oral Q6H PRN Derrill Center, NP   4 mg at 07/13/16 0555  . risperiDONE (RISPERDAL) tablet 0.5 mg  0.5 mg Oral Daily Artist Beach, MD      . risperiDONE (RISPERDAL) tablet 1 mg  1 mg Oral QHS Artist Beach, MD   1 mg at 07/12/16 2157  . sulfamethoxazole-trimethoprim (BACTRIM DS,SEPTRA DS) 800-160 MG per tablet 1 tablet  1 tablet Oral BID Derrill Center, NP   1 tablet at 07/13/16 1153  . traZODone (DESYREL) tablet 100 mg  100 mg Oral QHS PRN Derrill Center, NP   100 mg at 07/12/16 2157  . Vilazodone HCl (VIIBRYD) TABS 40 mg  40 mg Oral Q supper Derrill Center, NP   40 mg at 07/12/16 1813    Lab Results: No results found for  this or any previous visit (from the past 48 hour(s)).  Blood Alcohol level:  Lab Results  Component Value Date   ETH <5 07/08/2016   ETH <5 08/65/7846    Metabolic Disorder Labs: No results found for: HGBA1C, MPG No results found for: PROLACTIN Lab Results  Component Value Date   CHOL 125 01/10/2016  TRIG 66.0 01/10/2016   HDL 55.40 01/10/2016   CHOLHDL 2 01/10/2016   VLDL 13.2 01/10/2016   LDLCALC 57 01/10/2016    Physical Findings: AIMS: Facial and Oral Movements Muscles of Facial Expression: None, normal Lips and Perioral Area: None, normal Jaw: None, normal Tongue: None, normal,Extremity Movements Upper (arms, wrists, hands, fingers): None, normal Lower (legs, knees, ankles, toes): None, normal, Trunk Movements Neck, shoulders, hips: None, normal, Overall Severity Severity of abnormal movements (highest score from questions above): None, normal Incapacitation due to abnormal movements: None, normal Patient's awareness of abnormal movements (rate only patient's report): No Awareness, Dental Status Current problems with teeth and/or dentures?: No Does patient usually wear dentures?: No  CIWA:  CIWA-Ar Total: 1 COWS:  COWS Total Score: 7  Musculoskeletal: Strength & Muscle Tone: within normal limits Gait & Station: normal Patient leans: N/A  Psychiatric Specialty Exam: Physical Exam  Constitutional: He is oriented to person, place, and time. He appears well-developed and well-nourished.  HENT:  Head: Normocephalic and atraumatic.  Eyes: Conjunctivae are normal. Pupils are equal, round, and reactive to light.  Neck: Normal range of motion. Neck supple.  Cardiovascular: Normal rate, regular rhythm and normal heart sounds.   Respiratory: Effort normal and breath sounds normal.  GI: Soft. Bowel sounds are normal.  Musculoskeletal: Normal range of motion.  Neurological: He is alert and oriented to person, place, and time.  Skin: Skin is warm and dry.   Psychiatric:  As above.    ROS  Blood pressure (!) 129/92, pulse 94, temperature 99.1 F (37.3 C), resp. rate 18, height 5\' 7"  (1.702 m), weight 72.6 kg (160 lb).Body mass index is 25.06 kg/m.  General Appearance: Pleasant, calm and cooperative. Was on the phone prior to interview. Relates well. Not internally distracted  Eye Contact:  Better  Speech:  Spontaneous  Volume:  Normal  Mood:  Euthymic  Affect:  Full range and appropriate  Thought Process:  Linear  Orientation:  Full (Time, Place, and Person)  Thought Content:  More future oriented. No hallucination. No delusion. No preoccupation with violent thoughts.   Suicidal Thoughts:  No  Homicidal Thoughts:  No  Memory: did not assess at this time  Judgement:  Good  Insight:  Good  Psychomotor Activity:  Normal  Concentration:  Impaired   Recall:  Good  Fund of Knowledge: Good  Language:  Good  Akathisia:  No  Handed:    AIMS (if indicated):     Assets:  Desire for Improvement Physical Health Resilience  ADL's:  Impaired  Cognition:  Impaired,  Mild  Sleep:  Number of Hours: 3     Treatment Plan Summary: Patient is stable. He is not a danger to self or others. He is not psychotic or manic  Psychiatric: SUD ( THC, cocaine, methamphetamine, opiates) MDD Substance Induced Psychotic Disorder  Medical:  Psychosocial:  Recent end of his relationship  PLAN: 1. Continue current regimen 2. Continue to monitor mood, behavior and interaction with peers 3. Discharge tomorrow.   Artist Beach, MD 07/13/2016, 5:07 PMPatient ID: [name], male   DOB: Feb 23, 1981, 36 y.o.   MRN: [id number] Patient ID: [name], male   DOB: [birth year], 36 y.o.   MRN: [id number]

## 2016-07-13 NOTE — Progress Notes (Signed)
Recreation Therapy Notes  Date: 07/13/16 Time: 0930 Location: 300 Hall Dayroom  Group Topic: Stress Management  Goal Area(s) Addresses:  Patient will verbalize importance of using healthy stress management.  Patient will identify positive emotions associated with healthy stress management.   Intervention: Stress Management  Activity :  Self Forgiveness.  LRT introduced the stress management technique of meditation.  LRT played a meditation focused on self forgiveness allowing patients to participate in the technique.  Patients were to follow along as the meditation played to engage in the technique.  Education:  Stress Management, Discharge Planning.   Education Outcome: Acknowledges edcuation/In group clarification offered/Needs additional education  Clinical Observations/Feedback: Pt did not attend group.   Victorino Sparrow, LRT/CTRS         Victorino Sparrow A 07/13/2016 11:54 AM

## 2016-07-13 NOTE — Progress Notes (Signed)
  [facility] Adult Case Management Discharge Plan :  Will you be returning to the same living situation after discharge:  No. Pt accepted to [facility] [place] for tomorrow, 07/14/16 At discharge, do you have transportation home?: Yes,  [facility] [place] driver will pick you up at 11am on Saturday Do you have the ability to pay for your medications: Yes,  [employer] private insurance  Release of information consent forms completed and submitted to medical records by CSW.  Patient to Follow up at: [facility] [place] Follow up.   Why:  You have been accepted for treatment on Saturday. Driver from facility will pick you up at approximately 11:00AM on this date. Please make sure to have prescriptions and insurance card with you. Thank you.  Contact information: [place] [address] Phone: [phone] Fax: [phone]          Next level of care provider has access to [place] and Suicide Prevention discussed: Yes,  SPE completed with pt's fiance. SPI pamphlet and mobile Crisis information provided to pt.  Have you used any form of tobacco in the last 30 days? (Cigarettes, Smokeless Tobacco, Cigars, and/or Pipes): Yes  Has patient been referred to the Quitline?: Patient refused referral  Patient has been referred for addiction treatment: Yes  Vito Beg N Smart LCSW 07/13/2016, 10:20 AM

## 2016-07-13 NOTE — BHH Group Notes (Signed)
Patient did not attend AA group.

## 2016-07-13 NOTE — BHH Group Notes (Signed)
[facility] LCSW Group Therapy  07/13/2016 2:28 PM  Type of Therapy:  Group Therapy  Participation Level:  Did Not Attend-pt invited. Chose to remain in bed.   Summary of Progress/Problems: Feelings around Relapse. Group members discussed the meaning of relapse and shared personal stories of relapse, how it affected them and others, and how they perceived themselves during this time. Group members were encouraged to identify triggers, warning signs and coping skills used when facing the possibility of relapse. Social supports were discussed and explored in detail. Post Acute Withdrawal Syndrome (handout provided) was introduced and examined. Pt's were encouraged to ask questions, talk about key points associated with PAWS, and process this information in terms of relapse prevention.   [name] N Smart LCSW 07/13/2016, 2:28 PM

## 2016-07-13 NOTE — Progress Notes (Addendum)
Data. Patient denies SI/HI/AVH. Patient spent most of the shift in bed complaining of a migraine. MD notified and orders received and given. On his self assessment patient reports 4/10 for depression, 0/10 for hopelessness and 2/10 for anxiety. His goal for today is, "To go outside." Did get up in the later afternoon, stating that his headache was feeling, "better". Affect at that time was bright and he was noted joking and interacting with peers and staff. Action. Emotional support and encouragement offered. Education provided on medication, indications and side effect. Q 15 minute checks done for safety. Response. Safety on the unit maintained through 15 minute checks.  Medications taken as prescribed. Patient did not go to groups, but did go down to patio break. Remained calm and appropriate through out shift.

## 2016-07-13 NOTE — BHH Group Notes (Signed)
[facility] LCSW Aftercare Discharge Planning Group Note   07/13/2016 10:43 AM  Participation Quality:  DID NOT ATTEND. Invited. Chose to remain in bed.   Venisha Boehning N Smart LCSW

## 2016-07-14 MED ORDER — NICOTINE 21 MG/24HR TD PT24: 21.0000 mg | MEDICATED_PATCH | Freq: Every day | TRANSDERMAL | 0 refills | Status: DC

## 2016-07-14 MED ORDER — GABAPENTIN 300 MG PO CAPS: 600.0000 mg | ORAL_CAPSULE | Freq: Three times a day (TID) | ORAL | 0 refills | Status: DC

## 2016-07-14 MED ORDER — NAPROXEN 500 MG PO TABS
500.0000 mg | ORAL_TABLET | Freq: Once | ORAL | Status: AC
  Administered 2016-07-14: 500 mg via ORAL
  Filled 2016-07-14 (×2): qty 1

## 2016-07-14 MED ORDER — VILAZODONE HCL 40 MG PO TABS: 40.0000 mg | ORAL_TABLET | Freq: Every day | ORAL | 0 refills | Status: DC

## 2016-07-14 MED ORDER — TRAZODONE HCL 100 MG PO TABS: 100.0000 mg | ORAL_TABLET | Freq: Every evening | ORAL | 0 refills | Status: DC | PRN

## 2016-07-14 MED ORDER — RISPERIDONE 1 MG PO TABS: 1.0000 mg | ORAL_TABLET | Freq: Every day | ORAL | 0 refills | Status: DC

## 2016-07-14 MED ORDER — ONDANSETRON 4 MG PO TBDP
ORAL_TABLET | ORAL | Status: AC
  Filled 2016-07-14: qty 2

## 2016-07-14 MED ORDER — RISPERIDONE 0.5 MG PO TABS
0.5000 mg | ORAL_TABLET | Freq: Every day | ORAL | 0 refills | Status: DC
Start: 2016-07-14 — End: 2016-12-07

## 2016-07-14 MED ORDER — SULFAMETHOXAZOLE-TRIMETHOPRIM 800-160 MG PO TABS: 1.0000 | ORAL_TABLET | Freq: Two times a day (BID) | ORAL | 0 refills | Status: DC

## 2016-07-14 MED ORDER — CLONIDINE HCL 0.1 MG PO TABS: 0.1000 mg | ORAL_TABLET | Freq: Every day | ORAL | 1 refills | Status: DC

## 2016-07-14 MED ORDER — ONDANSETRON 4 MG PO TBDP
8.0000 mg | ORAL_TABLET | Freq: Three times a day (TID) | ORAL | Status: DC | PRN
  Administered 2016-07-14: 8 mg via ORAL

## 2016-07-14 NOTE — Progress Notes (Signed)
Patient ID: [name], male   DOB: 31-Jan-1981, 36 y.o.   MRN: [id number]  Pt currently presents with a flat affect and guarded, irritable behavior. Pt reports to writer that their goal is to "be discharged tomorrow." Pt awakes in the middle of the night with complaints of a migraine and nausea/vomiting. Pt sits in hot shower and takes as needed medications.   Pt provided with medications per providers orders. Pt's labs and vitals were monitored throughout the night. Pt given a 1:1 about emotional and mental status. Pt supported and encouraged to express concerns and questions. Pt educated on medications and substance withdrawal. Pt's belongings documented and placed behind curtain in search room for patient to take when discharged.   Pt's safety ensured with 15 minute and environmental checks. Pt currently denies SI/HI and A/V hallucinations. Pt verbally agrees to seek staff if SI/HI or A/VH occurs and to consult with staff before acting on any harmful thoughts. Pt complains of lingering headache this morning. Will continue POC.

## 2016-07-14 NOTE — Progress Notes (Signed)
Data. Patient denies SI/HI/AVH. Patient interacting well with staff and other patients. Affect was bright and mood cheerful. Patient laughing and joking with patients nd staff. He states he is, "looking forward to leaving." Action. Emotional support and encouragement offered. Education provided on medication, indications and side effect. Q 15 minute checks done for safety. Response. Safety on the unit maintained through 15 minute checks.  Medications taken as prescribed. Attended morning group. Remained calm and appropriate through out shift.  Pt. discharged to lobby.  Belongings sheet reviewed and signed by pt. and all belongings form locker #[id number] and from behind the curtain, sent home, including scripts. Paperwork reviewed and pt. able to verbalize understanding of education. Pt. in no current distress and ambulatory.

## 2016-07-14 NOTE — Discharge Summary (Signed)
Physician Discharge Summary Note  Patient:  [name] is an 36 y.o., male MRN:  [id number] DOB:  [birth year] Patient phone:  [phone] (home)  Patient address:   [address] [place] [address],  Total Time spent with patient: 30 minutes  Date of Admission:  07/09/2016 Date of Discharge: 07/14/2016  Reason for Admission: Per H&P assessment note-This is the second admission assessment in this [facility] for this 36 year old Caucasian male with hx of Major depression & Polysubstance dependence. He was a patient in this hospital in August of 2016 for worsening symptoms of depression. He received mood stabilization treatment & was discharged to follow-up care with Dr. Toy Care & for counseling sessions at the [facility]. This time around, [name] is being admitted to the hospital due to drug intoxication & worsening symptoms of depression. His UDS on admission was positive for Amphetamine & THC. During this assessment, [name] reports, "I walked-in to this hospital yesterday because I was a mess. I had just relapsed on Methamphetamine about a month ago. I was using it intravenously. I just want to stop, but I cannot stop on my own. I have messed up big time. My girlfriend is mad at me & would not have anything to do with me any more (sobbing). I have been talking to this guy named Deon who knows a treatment place in [place]. He has secured a bed for me there right now. I can go there now if you will allow me to leave today. I need to go there ASAP. I was sober from drugs x few months prior to my recent relapse. I'm feeling very depressed & disappointed in myself.  The last time I was in this hospital, it was for depression. 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 My sleep is always fragmented. I stay tired during the day, falling in & out of sleep". [name] presents with body aches, stomach aches, skin crawling & high anxiety level. He is tearful. He has used  buprenorphine treatment for opioid addiction.   Principal Problem: MDD (major depressive disorder) Discharge Diagnoses: Patient Active Problem List   Diagnosis Date Noted  . 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 Polysubstance (including opioids) dependence with physiological dependence ([place]) [F19.20] 07/09/2016  . MDD (major depressive disorder) [F32.9] 07/09/2016  . Encounter for well adult exam with abnormal findings [X83.38] 12/20/2015  . Bruises easily [R23.8] 12/20/2015  . Squamous cell carcinoma [C44.92] 05/19/2015  . Itching [L29.9] 05/19/2015  . Major depressive disorder, recurrent, severe without psychotic features ([place]) [F33.2]   . Anxiety disorder [F41.9] 12/05/2014  . Major depressive disorder, recurrent episode, severe ([place]) [F33.2] 12/05/2014  . Narcolepsy [G47.419] 10/27/2013    Past Psychiatric History:   Past Medical History:  Past Medical History:  Diagnosis Date  . Anemia   . Anxiety   . Arthritis   . Depression   . Hepatitis C   . Kidney stone   . Opiate dependence ([place])     Past Surgical History:  Procedure Laterality Date  . APPENDECTOMY    . left axillary node biopsy    . RADICAL NECK DISSECTION Left   . TONSILLECTOMY     Family History:  Family History  Problem Relation Age of Onset  . Kidney cancer Father     renal cell carcenoma  . Hodgkin's lymphoma Mother   . Colon cancer Paternal Grandfather     great  . Lung cancer Paternal Grandmother   .  Diabetes Maternal Grandmother   . Pancreatic cancer Maternal Grandmother   . Melanoma Maternal Grandfather    Family Psychiatric  History:  Social History:  History  Alcohol Use No     History  Drug Use  . Frequency: 7.0 times per week  . Types: Marijuana, Methamphetamines    Comment: stopped marijuana in Aug 2017    Social History   Social History  . Marital status: Single    Spouse name: N/A  . Number of children: 1  . Years of education: 61    Occupational History  . Mechanic     Social History Main Topics  . Smoking status: Current Some Day Smoker    Packs/day: 0.50    Years: 9.00    Types: Cigarettes  . Smokeless tobacco: Former Systems developer    Types: Chew    Quit date: 04/16/2008  . Alcohol use No  . Drug use: Yes    Frequency: 7.0 times per week    Types: Marijuana, Methamphetamines     Comment: stopped marijuana in Aug 2017  . Sexual activity: Not Asked   Other Topics Concern  . None   Social History Narrative   Fun: Play guitar and hang out with his daughter    Hospital Course:  Jasmin Trumbull was admitted for MDD (major depressive disorder) and crisis management.  Pt was treated discharged with the medications listed below under Medication List.  Medical problems were identified and treated as needed.  Home medications were restarted as appropriate.  Improvement was monitored by observation and [name] 's daily report of symptom reduction.  Emotional and mental status was monitored by daily self-inventory reports completed by [name] and clinical staff.         [name] was evaluated by the treatment team for stability and plans for continued recovery upon discharge. [name] 's motivation was an integral factor for scheduling further treatment. Employment, transportation, bed availability, health status, family support, and any pending legal issues were also considered during hospital stay. Pt was offered further treatment options upon discharge including but not limited to Residential, Intensive Outpatient, and Outpatient treatment.  [name] will follow up with the services as listed below under Follow Up Information.     Upon completion of this admission the patient was both mentally and medically stable for discharge denying suicidal/homicidal ideation, auditory/visual/tactile hallucinations, delusional thoughts and paranoia.     [name] responded well to treatment with Viibryd 40 mg, Celexa 40mg  and Trazodone 100 mg without adverse effects. Pt demonstrated improvement without reported or observed adverse effects to the point of stability appropriate for outpatient management. Pertinent labs include: Amphetamines and THC, for which outpatient follow-up is necessary for lab recheck as mentioned below. Reviewed CBC, CMP, BAL, and UDS; all unremarkable aside from noted exceptions.   Physical Findings: AIMS: Facial and Oral Movements Muscles of Facial Expression: None, normal Lips and Perioral Area: None, normal Jaw: None, normal Tongue: None, normal,Extremity Movements Upper (arms, wrists, hands, fingers): None, normal Lower (legs, knees, ankles, toes): None, normal, Trunk Movements Neck, shoulders, hips: None, normal, Overall Severity Severity of abnormal movements (highest score from questions above): None, normal Incapacitation due to abnormal movements: None, normal Patient's awareness of abnormal movements (rate only patient's report): No Awareness, Dental Status Current problems with teeth and/or dentures?: No Does patient usually wear dentures?: No  CIWA:  CIWA-Ar Total: 1 COWS:  COWS Total Score: 0  Musculoskeletal: Strength &  Muscle Tone: within normal limits Gait & Station: normal Patient leans: N/A  Psychiatric Specialty Exam: See SRA by MD  Physical Exam  ROS  Blood pressure (!) 132/96, pulse 87, temperature 99.6 F (37.6 C), temperature source Oral, resp. rate 18, height 5\' 7"  (1.702 m), weight 72.6 kg (160 lb).Body mass index is 25.06 kg/m.   Have you used any form of tobacco in the last 30 days? (Cigarettes, Smokeless Tobacco, Cigars, and/or Pipes): Yes  Has this patient used any form of tobacco in the last 30 days? (Cigarettes, Smokeless Tobacco, Cigars, and/or Pipes)  No  Blood Alcohol level:  Lab Results  Component Value Date   ETH <5 07/08/2016   ETH <5 29/79/8921     Metabolic Disorder Labs:  No results found for: HGBA1C, MPG No results found for: PROLACTIN Lab Results  Component Value Date   CHOL 125 01/10/2016   TRIG 66.0 01/10/2016   HDL 55.40 01/10/2016   CHOLHDL 2 01/10/2016   VLDL 13.2 01/10/2016   LDLCALC 57 01/10/2016    See Psychiatric Specialty Exam and Suicide Risk Assessment completed by Attending Physician prior to discharge.  Discharge destination:  Home  Is patient on multiple antipsychotic therapies at discharge:  No   Has Patient had three or more failed trials of antipsychotic monotherapy by history:  No  Recommended Plan for Multiple Antipsychotic Therapies: NA  Discharge Instructions    Diet - low sodium heart healthy    Complete by:  As directed    Discharge instructions    Complete by:  As directed    Take all medications as prescribed. Keep all follow-up appointments as scheduled.  Do not consume alcohol or use illegal drugs while on prescription medications. Report any adverse effects from your medications to your primary care provider promptly.  In the event of recurrent symptoms or worsening symptoms, call 911, a crisis hotline, or go to the nearest emergency department for evaluation.   Increase activity slowly    Complete by:  As directed      Allergies as of 07/14/2016      Reactions   Ceclor [cefaclor] Other (See Comments)   Does not recall, was a small child       Medication List    STOP taking these medications   acetaminophen 500 MG tablet Commonly known as:  TYLENOL   ADDERALL XR 30 MG 24 hr capsule Generic drug:  amphetamine-dextroamphetamine   ALPRAZolam 1 MG tablet Commonly known as:  XANAX   amphetamine-dextroamphetamine 30 MG tablet Commonly known as:  ADDERALL   citalopram 40 MG tablet Commonly known as:  CELEXA   ibuprofen 200 MG tablet Commonly known as:  ADVIL,MOTRIN   ondansetron 4 MG tablet Commonly known as:  ZOFRAN   pantoprazole 40 MG tablet Commonly known as:   PROTONIX     TAKE these medications     Indication  cloNIDine 0.1 MG tablet Commonly known as:  CATAPRES Take 1 tablet (0.1 mg total) by mouth daily before breakfast. Start taking on:  07/15/2016  Indication:  High Blood Pressure Disorder, Opiate Withdrawal   gabapentin 300 MG capsule Commonly known as:  NEURONTIN Take 2 capsules (600 mg total) by mouth 3 (three) times daily.  Indication:  Cocaine Dependence   nicotine 21 mg/24hr patch Commonly known as:  NICODERM CQ - dosed in mg/24 hours Place 1 patch (21 mg total) onto the skin daily.  Indication:  Nicotine Addiction   risperiDONE 0.5 MG tablet Commonly known as:  RISPERDAL Take 1 tablet (0.5 mg total) by mouth daily.  Indication:  mood stabilization   risperiDONE 1 MG tablet Commonly known as:  RISPERDAL Take 1 tablet (1 mg total) by mouth at bedtime.  Indication:  Mood control   sulfamethoxazole-trimethoprim 800-160 MG tablet Commonly known as:  BACTRIM DS,SEPTRA DS Take 1 tablet by mouth 2 (two) times daily.  Indication:  infection   traZODone 100 MG tablet Commonly known as:  DESYREL Take 1 tablet (100 mg total) by mouth at bedtime as needed for sleep.  Indication:  Trouble Sleeping   Vilazodone HCl 40 MG Tabs Commonly known as:  VIIBRYD Take 1 tablet (40 mg total) by mouth daily with supper. What changed:  when to take this  Indication:  Major Depressive Disorder      Follow-up [facility] Follow up.   Why:  You have been accepted for treatment on Saturday. Driver from facility will pick you up at approximately 11:00AM on this date. Please make sure to have prescriptions and insurance card with you. Thank you.  Contact information: [place] [address] Phone: [phone] Fax: [phone]          Follow-up recommendations:  Activity:  as tolerated Diet:  heart healthy  Comments: Take all medications as prescribed. Keep all follow-up appointments as  scheduled.  Do not consume alcohol or use illegal drugs while on prescription medications. Report any adverse effects from your medications to your primary care provider promptly.  In the event of recurrent symptoms or worsening symptoms, call 911, a crisis hotline, or go to the nearest emergency department for evaluation.   Signed: Derrill Center, NP 07/14/2016, 5:33 PM

## 2016-07-14 NOTE — BHH Suicide Risk Assessment (Signed)
[facility] Discharge Suicide Risk Assessment   Principal Problem:  Substance Induced Mood Disorder Discharge Diagnoses:  Patient Active Problem List   Diagnosis Date Noted  . 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 Polysubstance (including opioids) dependence with physiological dependence ([place]) [F19.20] 07/09/2016  . MDD (major depressive disorder) [F32.9] 07/09/2016  . Encounter for well adult exam with abnormal findings [M42.68] 12/20/2015  . Bruises easily [R23.8] 12/20/2015  . Squamous cell carcinoma [C44.92] 05/19/2015  . Itching [L29.9] 05/19/2015  . Major depressive disorder, recurrent, severe without psychotic features ([place]) [F33.2]   . Anxiety disorder [F41.9] 12/05/2014  . Major depressive disorder, recurrent episode, severe ([place]) [F33.2] 12/05/2014  . Narcolepsy [G47.419] 10/27/2013    Total Time spent with patient: 30 minutes  Musculoskeletal: Strength & Muscle Tone: within normal limits Gait & Station: normal Patient leans: N/A  Psychiatric Specialty Exam: Review of Systems  Constitutional: Negative.   HENT: Negative.   Eyes: Negative.   Respiratory: Negative.   Cardiovascular: Negative.   Gastrointestinal: Negative.   Genitourinary: Negative.   Musculoskeletal: Negative.   Skin: Negative.   Neurological: Negative.   Endo/Heme/Allergies: Negative.   Psychiatric/Behavioral: Negative for depression, hallucinations, memory loss, substance abuse and suicidal ideas. The patient is not nervous/anxious and does not have insomnia.     Blood pressure (!) 132/96, pulse 87, temperature 99.6 F (37.6 C), temperature source Oral, resp. rate 18, height 5\' 7"  (1.702 m), weight 72.6 kg (160 lb).Body mass index is 25.06 kg/m.  General Appearance: Neatly dressed, pleasant, engaging well and cooperative. Appropriate behavior. Not in any distress. Good relatedness. Not internally stimulated  Eye Contact::  Good  Speech:  Spontaneous, normal prosody. Normal tone  and rate.   Volume:  Normal  Mood:  Euthymic  Affect:  Appropriate and Full Range  Thought Process:  Goal Directed and Linear  Orientation:  Full (Time, Place, and Person)  Thought Content:  No delusional theme. No preoccupation with violent thoughts. No negative ruminations. No obsession.  No hallucination in any modality.   Suicidal Thoughts:  No  Homicidal Thoughts:  No  Memory:  Immediate;   Good Recent;   Good Remote;   Good  Judgement:  Good  Insight:  Good  Psychomotor Activity:  Normal  Concentration:  Good  Recall:  Good  Fund of Knowledge:Good  Language: Good  Akathisia:  No  Handed:    AIMS (if indicated):     Assets:  Communication Skills Desire for Improvement Financial Resources/Insurance Housing Intimacy Physical Health Social Support Vocational/Educational  Sleep:  Number of Hours: 4.25  Cognition: WNL  ADL's:  Intact   Clinical Assessment::   36 yo Caucasianmale, single, lives with is fiancee and their three yo daughter. Background history of SUD. Self presented requesting for detox. Reports daily use of cocaine and methamphetamine. UDS was positive for methamphetamine and THC. Patient was very tearful and distressed at presentation. He reports inability to sleep in days. He feels restless and depressed. Patient reports crawling sensation all over. He expressed thoughts of suicide if he does not get help. Patient has been managed here in the past for substance related issues. Says he was sober but relapsed about a month ago. His girlfriend has decided to end their relationship because of his drug use.  Patient has come off substances well. Headache has subsided. His family is supportive. Patient voluntarily plans to get into rehab today. He wants to get better and get back into his normal family life. His mood has  stabilized. He is no longer feeling down. No dysphoria. No suicidal thoughts. No thoughts of violence. No craving for substances. Says he is able to  think clearly. No anxiety. No evidence of mania. No evidence of psychosis.  Nursing staff reports that patient has been appropriate on the unit. Patient has been interacting well with peers. No behavioral issues. Patient has not voiced any suicidal thoughts. Patient has not been observed to be internally stimulated. Patient has been adherent with treatment recommendations. Patient has been tolerating their medication well.   Patient was discussed at team. Team members feels that patient is back to his baseline level of function. Team agrees with plan to discharge patient today.  Demographic Factors:  Male  Loss Factors: NA  Historical Factors: Impulsivity  Risk Reduction Factors:   Responsible for children under 29 years of age, Sense of responsibility to family, Employed, Living with another person, especially a relative, Positive social support, Positive therapeutic relationship and Positive coping skills or problem solving skills  Continued Clinical Symptoms:  As above  Cognitive Features That Contribute To Risk:  None    Suicide Risk:  Minimal: No identifiable suicidal ideation.   Patient is not having any thoughts of suicide at this time. Modifiable risk factors targeted during this admission includes depression and substance use. Demographical and historical risk factors cannot be modified. Patient is now engaging well. Patient is reliable and is future oriented. We have buffered patient's support structures. At this point, patient is at low risk of suicide. Patient is aware of the effects of psychoactive substances on decision making process. Patient has been provided with emergency contacts. Patient acknowledges to use resources provided if unforseen circumstances changes their current risk stratification.    [facility] Follow up.   Why:  You have been accepted for treatment on Saturday. Driver from facility will pick you up at approximately  11:00AM on this date. Please make sure to have prescriptions and insurance card with you. Thank you.  Contact information: [place] [address] Phone: [phone] Fax: [phone]          Plan Of Care/Follow-up recommendations:  1. Continue current psychotropic medications 2. Mental health and addiction follow up as arranged.     Artist [place], MD 07/14/2016, 9:37 AM

## 2016-07-14 NOTE — BHH Group Notes (Signed)
[facility] LCSW Group Therapy Note  07/14/2016 10:00 - 11:10 AM  Type of Therapy and Topic:  Group Therapy: Avoiding Self-Sabotaging and Enabling Behaviors  Participation Level:  Active  Participation Quality:  Attentive and Sharing  Affect:  Appropriate  Cognitive:  Alert and Oriented  Insight:  Developing/Improving  Engagement in Therapy:  Engaged   Therapeutic models used Cognitive Behavioral Therapy Person-Centered Therapy Motivational Interviewing   Summary of Patient Progress: The main focus of today's process group was to explain to the adolescent what "self-sabotage" means and use Motivational Interviewing to discuss what benefits, negative or positive, were involved in a self-identified self-sabotaging behavior. We then talked about reasons the patient may want to change the behavior and their current desire to change. 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  Sheilah Pigeon, LCSW

## 2016-12-03 ENCOUNTER — Encounter (HOSPITAL_COMMUNITY): Payer: Self-pay | Admitting: Emergency Medicine

## 2016-12-03 ENCOUNTER — Emergency Department (HOSPITAL_COMMUNITY)
Admission: EM | Admit: 2016-12-03 | Discharge: 2016-12-04 | Disposition: A | Discharge status: Other Acute Inpatient Hospital | Payer: BLUE CROSS/BLUE SHIELD | Attending: Emergency Medicine | Admitting: Emergency Medicine

## 2016-12-03 DIAGNOSIS — F17210 Nicotine dependence, cigarettes, uncomplicated: Secondary | ICD-10-CM | POA: Diagnosis not present

## 2016-12-03 DIAGNOSIS — F329 Major depressive disorder, single episode, unspecified: Secondary | ICD-10-CM | POA: Diagnosis present

## 2016-12-03 DIAGNOSIS — Z79899 Other long term (current) drug therapy: Secondary | ICD-10-CM | POA: Diagnosis not present

## 2016-12-03 DIAGNOSIS — F1520 Other stimulant dependence, uncomplicated: Secondary | ICD-10-CM | POA: Diagnosis present

## 2016-12-03 DIAGNOSIS — F339 Major depressive disorder, recurrent, unspecified: Secondary | ICD-10-CM | POA: Insufficient documentation

## 2016-12-03 LAB — CBC
HCT: 41.5 % (ref 39.0–52.0)
Hemoglobin: 15.2 g/dL (ref 13.0–17.0)
MCH: 30.8 pg (ref 26.0–34.0)
MCHC: 36.6 g/dL — AB (ref 30.0–36.0)
MCV: 84 fL (ref 78.0–100.0)
PLATELETS: 373 10*3/uL (ref 150–400)
RBC: 4.94 MIL/uL (ref 4.22–5.81)
RDW: 12.2 % (ref 11.5–15.5)
WBC: 8.1 10*3/uL (ref 4.0–10.5)

## 2016-12-03 LAB — COMPREHENSIVE METABOLIC PANEL
ALK PHOS: 49 U/L (ref 38–126)
ALT: 37 U/L (ref 17–63)
ANION GAP: 9 (ref 5–15)
AST: 43 U/L — ABNORMAL HIGH (ref 15–41)
Albumin: 4.5 g/dL (ref 3.5–5.0)
BUN: 15 mg/dL (ref 6–20)
CALCIUM: 8.9 mg/dL (ref 8.9–10.3)
CO2: 26 mmol/L (ref 22–32)
CREATININE: 0.98 mg/dL (ref 0.61–1.24)
Chloride: 102 mmol/L (ref 101–111)
Glucose, Bld: 100 mg/dL — ABNORMAL HIGH (ref 65–99)
Potassium: 3.4 mmol/L — ABNORMAL LOW (ref 3.5–5.1)
SODIUM: 137 mmol/L (ref 135–145)
Total Bilirubin: 1.4 mg/dL — ABNORMAL HIGH (ref 0.3–1.2)
Total Protein: 7.5 g/dL (ref 6.5–8.1)

## 2016-12-03 LAB — RAPID URINE DRUG SCREEN, HOSP PERFORMED
Amphetamines: POSITIVE — AB
Barbiturates: NOT DETECTED
Benzodiazepines: NOT DETECTED
COCAINE: NOT DETECTED
OPIATES: NOT DETECTED
TETRAHYDROCANNABINOL: POSITIVE — AB

## 2016-12-03 LAB — SALICYLATE LEVEL

## 2016-12-03 LAB — ACETAMINOPHEN LEVEL

## 2016-12-03 LAB — ETHANOL

## 2016-12-03 MED ORDER — ALUM & MAG HYDROXIDE-SIMETH 200-200-20 MG/5ML PO SUSP: 30.0000 mL | Freq: Four times a day (QID) | ORAL | Status: DC | PRN

## 2016-12-03 MED ORDER — ACETAMINOPHEN 325 MG PO TABS: 650.0000 mg | ORAL_TABLET | ORAL | Status: DC | PRN

## 2016-12-03 MED ORDER — ONDANSETRON HCL 4 MG PO TABS: 4.0000 mg | ORAL_TABLET | Freq: Three times a day (TID) | ORAL | Status: DC | PRN

## 2016-12-03 NOTE — ED Notes (Signed)
Report called waiting on MD to fill out [facility] to transport patient.

## 2016-12-03 NOTE — ED Notes (Signed)
Bed: WTR5 Expected date:  Expected time:  Means of arrival:  Comments: 

## 2016-12-03 NOTE — ED Notes (Signed)
Pt presents with sad affect. Pt reports depression. Pt endorsing passive SI. Denies HI/AVH. Encouragement and support provided. Special checks q 15 mins in place for safety, Video monitoring in place. Will continue to monitor.
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
## 2016-12-03 NOTE — BH Assessment (Addendum)
[place] Assessment Progress Note  Per Hampton Abbot, MD, this pt requires psychiatric hospitalization at this time.  Letitia Libra, RN, [facility] has assigned pt to [facility] Rm [id number]; they will be ready to receive pt at 20:00.  Pt has signed Voluntary Admission and Consent for Treatment, as well as Consent to Release Information to Chucky May, MD, his most recent psychiatrist, and to Agapito Games, the mother of his child, and signed forms have been faxed to [facility].  Pt's nurse, Caryl Pina, has been notified, and agrees to send original paperwork along with pt via B[place]y Pries, and to call report to [phone].  At pt's request, this writer called Ms Joanne Chars to notify her of pt's disposition.  Jalene Mullet, [facility] Triage Specialist [phone]

## 2016-12-03 NOTE — ED Triage Notes (Signed)
Patient complaining of wanting to kill himself. Patient states that he wanted to jump out of his car while driving. Patient states he has been doing meth for the past week. He states he is not high right now per patient.

## 2016-12-03 NOTE — ED Provider Notes (Signed)
[facility] DEPT Provider Note   CSN: [id number] Arrival date & time: 12/03/16  0327     History   Chief Complaint Chief Complaint  Patient presents with  . Suicidal  . Depression    HPI [name] is a 36 y.o. male.  The history is provided by the patient. No language interpreter was used.  Depression  This is a recurrent problem. The current episode started more than 1 week ago. The problem occurs constantly. The problem has been rapidly worsening. Pertinent negatives include no chest pain, no abdominal pain, no headaches and no shortness of breath. Nothing aggravates the symptoms. Nothing relieves the symptoms. He has tried nothing for the symptoms. The treatment provided no relief.    Past Medical History:  Diagnosis Date  . Anemia   . Anxiety   . Arthritis   . Depression   . Hepatitis C   . Kidney stone   . Opiate dependence [facility])     Patient Active Problem List   Diagnosis Date Noted  . 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 Polysubstance (including opioids) dependence with physiological dependence ([place]) 07/09/2016  . MDD (major depressive disorder) 07/09/2016  . Encounter for well adult exam with abnormal findings 12/20/2015  . Bruises easily 12/20/2015  . Squamous cell carcinoma 05/19/2015  . Itching 05/19/2015  . Major depressive disorder, recurrent, severe without psychotic features ([place])   . Anxiety disorder 12/05/2014  . Major depressive disorder, recurrent episode, severe ([place]) 12/05/2014  . 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 APPENDECTOMY    . left axillary node biopsy    . RADICAL NECK DISSECTION Left   . TONSILLECTOMY         Home Medications    Prior to Admission medications   Medication Sig Start Date End Date Taking? Authorizing Provider  cloNIDine (CATAPRES) 0.1 MG tablet Take 1 tablet (0.1 mg total) by mouth daily before breakfast. Patient not taking: Reported  on 12/03/2016 07/15/16   Derrill Center, NP  gabapentin (NEURONTIN) 300 MG capsule Take 2 capsules (600 mg total) by mouth 3 (three) times daily. Patient not taking: Reported on 12/03/2016 07/14/16   Derrill Center, NP  nicotine (NICODERM CQ - DOSED IN MG/24 HOURS) 21 mg/24hr patch Place 1 patch (21 mg total) onto the skin daily. Patient not taking: Reported on 12/03/2016 07/14/16   Derrill Center, NP  risperiDONE (RISPERDAL) 0.5 MG tablet Take 1 tablet (0.5 mg total) by mouth daily. Patient not taking: Reported on 12/03/2016 07/14/16   Derrill Center, NP  risperiDONE (RISPERDAL) 1 MG tablet Take 1 tablet (1 mg total) by mouth at bedtime. Patient not taking: Reported on 12/03/2016 07/14/16   Derrill Center, NP  sulfamethoxazole-trimethoprim (BACTRIM DS,SEPTRA DS) 800-160 MG tablet Take 1 tablet by mouth 2 (two) times daily. Patient not taking: Reported on 12/03/2016 07/14/16   Derrill Center, NP  traZODone (DESYREL) 100 MG tablet Take 1 tablet (100 mg total) by mouth at bedtime as needed for sleep. Patient not taking: Reported on 12/03/2016 07/14/16   Derrill Center, NP  Vilazodone HCl (VIIBRYD) 40 MG TABS Take 1 tablet (40 mg total) by mouth daily with supper. Patient not taking: Reported on 12/03/2016 07/14/16   Derrill Center, NP    Family History Family History  Problem Relation Age of Onset  . Kidney cancer Father        renal cell carcenoma  . Hodgkin's  lymphoma Mother   . Colon cancer Paternal Grandfather        great  . Lung cancer Paternal Grandmother   . Diabetes Maternal Grandmother   . Pancreatic cancer Maternal Grandmother   . Melanoma Maternal Grandfather     Social History Social History  Substance Use Topics  . Smoking status: Current Some Day Smoker    Packs/day: 0.50    Years: 9.00    Types: Cigarettes  . Smokeless tobacco: Former Systems developer    Types: Chew    Quit date: 04/16/2008  . Alcohol use No     Allergies   Ceclor [cefaclor]   Review of Systems Review of  Systems  Respiratory: Negative for shortness of breath.   Cardiovascular: Negative for chest pain.  Gastrointestinal: Negative for abdominal pain.  Neurological: Negative for headaches.  Psychiatric/Behavioral: Positive for depression. Negative for self-injury.  All other systems reviewed and are negative.    Physical Exam Updated Vital Signs BP 119/83 (BP Location: Right Arm)   Pulse 87   Temp (!) 97.4 F (36.3 C) (Oral)   Resp 18   SpO2 100%   Physical Exam  Constitutional: He appears well-developed and well-nourished. No distress.  HENT:  Head: Normocephalic and atraumatic.  Nose: Nose normal.  Eyes: Pupils are equal, round, and reactive to light. Conjunctivae are normal.  Neck: Normal range of motion. Neck supple.  Cardiovascular: Normal rate, regular rhythm, normal heart sounds and intact distal pulses.   Pulmonary/Chest: Effort normal and breath sounds normal. No respiratory distress. He has no wheezes. He has no rales.  Abdominal: Soft. Bowel sounds are normal. He exhibits no mass. There is no tenderness. There is no rebound and no guarding.  Musculoskeletal: Normal range of motion.  Neurological: He is alert.  Skin: Skin is warm and dry. Capillary refill takes less than 2 seconds.  Psychiatric: He has a normal mood and affect.     ED Treatments / Results   Vitals:   12/03/16 0336  BP: 119/83  Pulse: 87  Resp: 18  Temp: (!) 97.4 F (36.3 C)  SpO2: 100%    Labs (all labs ordered are listed, but only abnormal results are displayed)  Results for orders placed or performed during the hospital encounter of 12/03/16  Comprehensive metabolic panel  Result Value Ref Range   Sodium 137 135 - 145 mmol/L   Potassium 3.4 (L) 3.5 - 5.1 mmol/L   Chloride 102 101 - 111 mmol/L   CO2 26 22 - 32 mmol/L   Glucose, Bld 100 (H) 65 - 99 mg/dL   BUN 15 6 - 20 mg/dL   Creatinine, Ser 0.98 0.61 - 1.24 mg/dL   Calcium 8.9 8.9 - 10.3 mg/dL   Total Protein 7.5 6.5 - 8.1 g/dL     Albumin 4.5 3.5 - 5.0 g/dL   AST 43 (H) 15 - 41 U/L   ALT 37 17 - 63 U/L   Alkaline Phosphatase 49 38 - 126 U/L   Total Bilirubin 1.4 (H) 0.3 - 1.2 mg/dL   GFR calc non Af Amer >60 >60 mL/min   GFR calc Af Amer >60 >60 mL/min   Anion gap 9 5 - 15  Ethanol  Result Value Ref Range   Alcohol, Ethyl (B) <5 <5 mg/dL  Salicylate level  Result Value Ref Range   Salicylate Lvl <0.2 2.8 - 30.0 mg/dL  Acetaminophen level  Result Value Ref Range   Acetaminophen (Tylenol), Serum <10 (L) 10 - 30 ug/mL  cbc  Result Value Ref Range   WBC 8.1 4.0 - 10.5 K/uL   RBC 4.94 4.22 - 5.81 MIL/uL   Hemoglobin 15.2 13.0 - 17.0 g/dL   HCT 41.5 39.0 - 52.0 %   MCV 84.0 78.0 - 100.0 fL   MCH 30.8 26.0 - 34.0 pg   MCHC 36.6 (H) 30.0 - 36.0 g/dL   RDW 12.2 11.5 - 15.5 %   Platelets 373 150 - 400 K/uL  Rapid urine drug screen (hospital performed)  Result Value Ref Range   Opiates NONE DETECTED NONE DETECTED   Cocaine NONE DETECTED NONE DETECTED   Benzodiazepines NONE DETECTED NONE DETECTED   Amphetamines POSITIVE (A) NONE DETECTED   Tetrahydrocannabinol POSITIVE (A) NONE DETECTED   Barbiturates NONE DETECTED NONE DETECTED   No results found.   Radiology No results found.  Procedures Procedures (including critical care time)  Final Clinical Impressions(s) / ED Diagnoses  Depression: medically cleared by me disposition by tts   Nalea Salce, MD 12/03/16 7741

## 2016-12-03 NOTE — ED Notes (Signed)
Patient admits to [facility] with a plan to jump out of his car. Patent currently denies HI and AVH at this time. Plan of care discussed with patient. Patient voices no complaints or concerns at this time. Encouragement and support provided and safety maintain. Q 15 min safety checks in place and video monitoring.

## 2016-12-03 NOTE — Progress Notes (Signed)
12/03/16 1353:  LRT introduced self to pt and offered activities, pt declined.   Victorino Sparrow, LRT/CTRS

## 2016-12-03 NOTE — ED Notes (Signed)
Pt has poor eye contact and a flat/sad affect. Pt says that he is tired of living . He reports that one of his stressors is conflicts with his 34 year old daughter's mother. He has a hx of sobriety for five years. Pt said that he wanted "to get back to that spot."

## 2016-12-04 ENCOUNTER — Encounter (HOSPITAL_COMMUNITY): Payer: Self-pay | Admitting: *Deleted

## 2016-12-04 ENCOUNTER — Inpatient Hospital Stay (HOSPITAL_COMMUNITY)
Admission: AD | Admit: 2016-12-04 | Discharge: 2016-12-07 | DRG: 885 | Disposition: A | Discharge status: Home / Self Care | Payer: BLUE CROSS/BLUE SHIELD | Attending: Psychiatry | Admitting: Psychiatry

## 2016-12-04 DIAGNOSIS — F17210 Nicotine dependence, cigarettes, uncomplicated: Secondary | ICD-10-CM

## 2016-12-04 DIAGNOSIS — Z6379 Other stressful life events affecting family and household: Secondary | ICD-10-CM | POA: Diagnosis not present

## 2016-12-04 DIAGNOSIS — F1920 Other psychoactive substance dependence, uncomplicated: Secondary | ICD-10-CM | POA: Diagnosis present

## 2016-12-04 DIAGNOSIS — F419 Anxiety disorder, unspecified: Secondary | ICD-10-CM | POA: Diagnosis present

## 2016-12-04 DIAGNOSIS — R443 Hallucinations, unspecified: Secondary | ICD-10-CM | POA: Diagnosis not present

## 2016-12-04 DIAGNOSIS — F1211 Cannabis abuse, in remission: Secondary | ICD-10-CM | POA: Diagnosis not present

## 2016-12-04 DIAGNOSIS — R45851 Suicidal ideations: Secondary | ICD-10-CM | POA: Diagnosis present

## 2016-12-04 DIAGNOSIS — F1520 Other stimulant dependence, uncomplicated: Secondary | ICD-10-CM

## 2016-12-04 DIAGNOSIS — F332 Major depressive disorder, recurrent severe without psychotic features: Secondary | ICD-10-CM | POA: Diagnosis present

## 2016-12-04 DIAGNOSIS — G4700 Insomnia, unspecified: Secondary | ICD-10-CM

## 2016-12-04 DIAGNOSIS — F39 Unspecified mood [affective] disorder: Secondary | ICD-10-CM | POA: Diagnosis not present

## 2016-12-04 DIAGNOSIS — F1290 Cannabis use, unspecified, uncomplicated: Secondary | ICD-10-CM | POA: Diagnosis not present

## 2016-12-04 DIAGNOSIS — F1190 Opioid use, unspecified, uncomplicated: Secondary | ICD-10-CM | POA: Diagnosis not present

## 2016-12-04 HISTORY — DX: Personal history of urinary calculi: Z87.442

## 2016-12-04 MED ORDER — ALUM & MAG HYDROXIDE-SIMETH 200-200-20 MG/5ML PO SUSP: 30.0000 mL | ORAL | Status: DC | PRN

## 2016-12-04 MED ORDER — ACETAMINOPHEN 325 MG PO TABS: 650.0000 mg | ORAL_TABLET | Freq: Four times a day (QID) | ORAL | Status: DC | PRN

## 2016-12-04 MED ORDER — MAGNESIUM HYDROXIDE 400 MG/5ML PO SUSP: 30.0000 mL | Freq: Every day | ORAL | Status: DC | PRN

## 2016-12-04 MED ORDER — ENSURE ENLIVE PO LIQD
237.0000 mL | Freq: Two times a day (BID) | ORAL | Status: DC
  Administered 2016-12-05 – 2016-12-06 (×4): 237 mL via ORAL

## 2016-12-04 MED ORDER — HYDROXYZINE HCL 25 MG PO TABS
25.0000 mg | ORAL_TABLET | Freq: Four times a day (QID) | ORAL | Status: DC | PRN
  Filled 2016-12-04: qty 20

## 2016-12-04 MED ORDER — QUETIAPINE FUMARATE 50 MG PO TABS
50.0000 mg | ORAL_TABLET | Freq: Every day | ORAL | Status: DC
  Administered 2016-12-04: 50 mg via ORAL
  Filled 2016-12-04 (×3): qty 1

## 2016-12-04 MED ORDER — TRAZODONE HCL 50 MG PO TABS
50.0000 mg | ORAL_TABLET | Freq: Every evening | ORAL | Status: DC | PRN
  Filled 2016-12-04: qty 14

## 2016-12-04 MED ORDER — GABAPENTIN 100 MG PO CAPS
100.0000 mg | ORAL_CAPSULE | Freq: Two times a day (BID) | ORAL | Status: DC
  Administered 2016-12-04 – 2016-12-07 (×6): 100 mg via ORAL
  Filled 2016-12-04: qty 1
  Filled 2016-12-04: qty 28
  Filled 2016-12-04 (×4): qty 1
  Filled 2016-12-04: qty 28
  Filled 2016-12-04: qty 1
  Filled 2016-12-04 (×2): qty 28
  Filled 2016-12-04 (×2): qty 1

## 2016-12-04 NOTE — Tx Team (Signed)
Initial Treatment Plan 12/04/2016 3:09 AM [name] [id number]    PATIENT STRESSORS: Financial difficulties Legal issue Marital or family conflict Medication change or noncompliance Substance abuse   PATIENT STRENGTHS: Ability for insight Average or above average intelligence Capable of independent living General fund of knowledge   PATIENT IDENTIFIED PROBLEMS: Depression  Substance abuse  Risk for self harm  Conflict with significant other    "Get stabilized and start back on my medications"  "Get into a treatment program"         DISCHARGE CRITERIA:  Adequate post-discharge living arrangements Improved stabilization in mood, thinking, and/or behavior Motivation to continue treatment in a less acute level of care Need for constant or close observation no longer present Verbal commitment to aftercare and medication compliance Withdrawal symptoms are absent or subacute and managed without 24-hour nursing intervention  PRELIMINARY DISCHARGE PLAN: Attend aftercare/continuing care group Outpatient therapy Placement in alternative living arrangements  PATIENT/FAMILY INVOLVEMENT: This treatment plan has been presented to and reviewed with the patient, [name], and/or family memberD.  The patient and family have been given the opportunity to ask questions and make suggestions.  Ronney Asters, RN 12/04/2016, 3:09 AM
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
## 2016-12-04 NOTE — [facility] Counselor (Signed)
Adult Comprehensive Assessment  Patient ID: [name], male   DOB: 11-Nov-1980, 36 y.o.   MRN: [id number]  Information Source: Information source: Patient  Current Stressors:  Educational / Learning stressors: None reported  Employment / Job issues: Currently unemployed  Family Relationships: Conflictual relationships with some family members  Museum/gallery curator / Lack of resources (include bankruptcy): None reported  Housing / Lack of housing: Currently homeless  Physical health (include injuries & life threatening diseases): None reported  Social relationships: Few social supports  Substance abuse: Meth and heroin use daily  Bereavement / Loss: None reported   Living/Environment/Situation:  Living Arrangements: Non-relatives/Friends Living conditions (as described by patient or guardian): Pt was living in a halfway house and relapsed. Pt is unable to return to his halfway house due to relapse so now pt is currently homeless. How long has patient lived in current situation?: A few days  What is atmosphere in current home: Temporary  Family History:  Marital status: Long term relationship Long term relationship, how long?: Since 2001  What types of issues is patient dealing with in the relationship?: Pt's frequent relapses  Does patient have children?: Yes How many children?: 1 (Daughter ) How is patient's relationship with their children?: good relationship with daughter; close  Childhood History:  By whom was/is the patient raised?: Mother Description of patient's relationship with caregiver when they were a child: Mother was an addict, she introduced him to drugs Patient's description of current relationship with people who raised him/her: Pt reports having a good relationship with his mother currently  Does patient have siblings?: Yes Number of Siblings: 3 Description of patient's current relationship with siblings: good relationship with siblings Did patient suffer any  verbal/emotional/physical/sexual abuse as a child?: No Did patient suffer from severe childhood neglect?: No Has patient ever been sexually abused/assaulted/raped as an adolescent or adult?: No Was the patient ever a victim of a crime or a disaster?: No Witnessed domestic violence?: No Has patient been effected by domestic violence as an adult?: No  Education:  Highest grade of school patient has completed: GED Currently a Ship broker?: No  Employment/Work Situation:   Employment situation: Unemployed What is the longest time patient has a held a job?: 5 years 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 Pt states that he has a 4 mo period of sobriety and recently relapsed due to stress. Alcohol/Substance Abuse Treatment Hx: Past Tx, Inpatient If yes, describe treatment: [facility]  Social Support System:   Heritage manager System: Poor Describe Community Support System: Pt states that his support system is weak because he has burned a lot of bridges due to his drug use  Type of faith/religion: None  How does patient's faith help to cope with current illness?: NA  Leisure/Recreation:   Leisure and Hobbies: play guitar, learning about space  Strengths/Needs:   What things does the patient do well?: Playing guitar  In what areas does patient struggle / problems for patient: Substance use   Discharge Plan:   Does patient have access to transportation?: Yes Will patient be returning to same living situation after discharge?: No  Plan for living situation after discharge: Pt is hoping to discharge to a residential  treatment program  Currently receiving community mental health services: No If no, would patient like referral for services when discharged?: Yes (What county?) ([place]) Does patient have financial barriers related to discharge medications?: Yes Patient description of barriers related to discharge medications: Limited resources   Summary/Recommendations:     Patient is a 36 yo male who presented to the hospital with depression and substance use. Pt had a 4 month period of sobriety and relapsed prior to this hospitalization. Pt reports daily meth use and daily heroin use. Pt's primary diagnosis is Major Depressive Disorder with psychosis, Substance Induced Mood Disorder, and Polysubstance Use Disorder. Primary triggers for admission include relapsing on substances, relationship issues, and being kicked out of his halfway house due to relpasing. During the time of the assessment pt was lethargic, and presented with a depressed and tearful affect. Pt is agreeable to [facility] and [facility] for inpatient residential treatment. Pt's supports include his girlfriend and some friends. Patient will benefit from crisis stabilization, medication evaluation, group therapy and pyschoeducation, in addition to case management for discharge planning. At discharge, it is recommended that pt remain compliant with the established discharge plan and continue treatment.   Georga Kaufmann, MSW, Latanya Presser  12/04/2016

## 2016-12-04 NOTE — Progress Notes (Signed)
D: Pt was at nurse's station with visitor upon initial approach.  He reports he had a "good" visit.  Describes his day as "still kind of rough" and did not elaborate.  His goal is "working towards getting treatment."  Pt discussed how he may go to [facility] but he plans to discuss [facility] as an option with his Chief Technology Officer.  Pt presents with depressed affect and mood.  Pt denies SI/HI, denies hallucinations, denies pain.  Pt has been visible in milieu at times; few interactions with peers.    A: Introduced self to pt.  Actively listened to pt and provided support and encouragement. Medication administered per order.  Q15 minute safety checks maintained.  R: Pt is safe on the unit.  Pt is compliant with medication.  Pt verbally contracts for safety.  Will continue to monitor and assess.

## 2016-12-04 NOTE — H&P (Signed)
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 MDD (major depressive disorder), recurrent severe, without psychosis ([place]) [F33.2] 12/04/2016  . Amphetamine use disorder, severe ([place]) [F15.20] 07/10/2016  . Polysubstance (including opioids) dependence with physiological dependence ([place]) [F19.20] 07/09/2016  . MDD (major depressive disorder) [F32.9] 07/09/2016  . Encounter for well adult exam with abnormal findings [L93.79] 12/20/2015  . Bruises easily [R23.8] 12/20/2015  . Squamous cell carcinoma [C44.92] 05/19/2015  . Itching [L29.9] 05/19/2015  . Major depressive disorder, recurrent, severe without psychotic features ([place]) [F33.2]   . Anxiety disorder [F41.9] 12/05/2014  . Major depressive disorder, recurrent episode, severe ([place]) [F33.2] 12/05/2014  . 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 male who presents to the ED voluntarily. Pt reports increased depression and suicidal thoughts onset 3 days ago. Pt tearful throughout the assessment and states that he had thoughts of jumping out of his car while he was driving in order to commit suicide. Pt states he has attempted suicide as a teenager but denies any recent attempts. Pt reports he is prescribed psych meds but states he stopped taking them about 3 days ago when his symptoms began. Pt states he is dealing with relationship issues with his child's mother and "just feelings exhausted and discouraged about everything." Pt reports that he felt "freaked out" when he thought about suicide, so he came to the hospital. Pt reports possible substance  induced psychosis and reported to this writer that he thought his friend was riding with him in his car because he saw him in the seat and was talking to him but states that no one was in the car. Pt reports he has been up for several days and recently relapsed on drugs. Pt reports he has been sober many times in the past and the longest period of sobriety was 4.5 years. Pt unable to contract for safety and states he feels he is ready to receive help. Pt denies HI and denies a hx of aggression to others.  On evaluation: [name] is awake, alert and oriented. Patient was evaluated by MD and NP. Patient continues to reports suicidal ideation due to a recent relapse. patient is able to contract for safety. Denies auditory or visual hallucination and does not appear to be responding to internal stimuli.Patient reports he hasn't been  medication compliant. Reports he was previous prescribed Vybird, Ativan and trazodone.  States his depression 10/10. Patient states "I feeling not myself today" Reports poor appetite and is not resting well.  Patient validates the information that was provided in the HPI. Support, encouragement and reassurance was provided.  Associated Signs/Symptoms: Depression Symptoms:  depressed mood, feelings of worthlessness/guilt, suicidal thoughts with specific plan, (Hypo) Manic Symptoms:  Distractibility, Irritable Mood, Anxiety Symptoms:  Excessive Worry, Psychotic Symptoms:  Hallucinations: None PTSD Symptoms: Avoidance:  Decreased Interest/Participation Total Time spent with patient: 30 minutes  Past Psychiatric History:   Is the patient at risk to self? Yes.    Has the patient been a risk to self in the past 6 months? Yes.    Has the patient been a risk to self within the distant past? Yes.    Is the patient  a risk to others? No.  Has the patient been a risk to others in the past 6 months? No.  Has the patient been a risk to others within the distant past?  No.   Prior Inpatient Therapy:   Prior Outpatient Therapy:    Alcohol Screening: 1. How often do you have a drink containing alcohol?: Monthly or less 2. How many drinks containing alcohol do you have on a typical day when you are drinking?: 1 or 2 3. How often do you have six or more drinks on one occasion?: Never Preliminary Score: 0 9. Have you or someone else been injured as a result of your drinking?: No 10. Has a relative or friend or a doctor or another health worker been concerned about your drinking or suggested you cut down?: No Alcohol Use Disorder Identification Test Final Score (AUDIT): 1 Brief Intervention: AUDIT score less than 7 or less-screening does not suggest unhealthy drinking-brief intervention not indicated Substance Abuse History in the last 12 months:  Yes.   Consequences of Substance Abuse: NA Previous Psychotropic Medications: YES Psychological Evaluations: YES Past Medical History:  Past Medical History:  Diagnosis Date  . Anemia   . Anxiety   . Arthritis   . Depression   . Hepatitis C   . History of kidney stones   . Kidney stone   . Opiate dependence (HCC)     Past Surgical History:  Procedure Laterality Date  . APPENDECTOMY    . left axillary node biopsy    . RADICAL NECK DISSECTION Left   . TONSILLECTOMY     Family History:  Family History  Problem Relation Age of Onset  . Kidney cancer Father        renal cell carcenoma  . Hodgkin's lymphoma Mother   . Colon cancer Paternal Grandfather        great  . Lung cancer Paternal Grandmother   . Diabetes Maternal Grandmother   . Pancreatic cancer Maternal Grandmother   . Melanoma Maternal Grandfather    Family Psychiatric  History:  Tobacco Screening: Have you used any form of tobacco in the last 30 days? (Cigarettes, Smokeless Tobacco, Cigars, and/or Pipes): Yes Tobacco use, Select all that apply: 5 or more cigarettes per day Are you interested in Tobacco Cessation Medications?: No,  patient refused Counseled patient on smoking cessation including recognizing danger situations, developing coping skills and basic information about quitting provided: Refused/Declined practical counseling Social History:  History  Alcohol Use  . Yes    Comment: "rarely"     History  Drug Use  . Frequency: 7.0 times per week  . Types: Marijuana, Methamphetamines    Comment: stopped marijuana in Aug 2017    Additional Social History:      Pain Medications: See home med list Prescriptions: See home med list Over the Counter: See home med list History of alcohol / drug use?: Yes Longest period of sobriety (when/how long): 4.5 yrs Negative Consequences of Use: Personal relationships, Financial Name of Substance 1: ETH 1 - Age of First Use: teens  1 - Amount (size/oz): 2 beers 1 - Frequency: occasionally 1 - Duration: ongoing 1 - Last Use / Amount: 12/02/16 Name of Substance 2: Meth 2 - Age of First Use: teens 2 - Amount (size/oz): 0.5 gram 2 - Frequency: weekly 2 - Duration: ongoing 2 - Last Use / Amount: 12/01/16 Name of Substance 3: THC 3 - Age of First Use: teens 3 - Amount (size/oz): 1 joint 3 -   Frequency: weekly 3 - Duration: ongoing 3 - Last Use / Amount: 11/29/16 Name of Substance 4: heroin 4 - Age of First Use: teens 4 - Amount (size/oz): 0.25 gram 4 - Frequency: once or twice a month 4 - Duration: ongoing 4 - Last Use / Amount: Some time in July            Allergies:   Allergies  Allergen Reactions  . Ceclor [Cefaclor] Other (See Comments)    Does not recall, was a small child    Lab Results:  Results for orders placed or performed during the hospital encounter of 12/03/16 (from the past 48 hour(s))  Rapid urine drug screen (hospital performed)     Status: Abnormal   Collection Time: 12/03/16  3:56 AM  Result Value Ref Range   Opiates NONE DETECTED NONE DETECTED   Cocaine NONE DETECTED NONE DETECTED   Benzodiazepines NONE DETECTED NONE DETECTED    Amphetamines POSITIVE (A) NONE DETECTED   Tetrahydrocannabinol POSITIVE (A) NONE DETECTED   Barbiturates NONE DETECTED NONE DETECTED    Comment:        DRUG SCREEN FOR MEDICAL PURPOSES ONLY.  IF CONFIRMATION IS NEEDED FOR ANY PURPOSE, NOTIFY LAB WITHIN 5 DAYS.        LOWEST DETECTABLE LIMITS FOR URINE DRUG SCREEN Drug Class       Cutoff (ng/mL) Amphetamine      1000 Barbiturate      200 Benzodiazepine   200 Tricyclics       300 Opiates          300 Cocaine          300 THC              50   Comprehensive metabolic panel     Status: Abnormal   Collection Time: 12/03/16  3:57 AM  Result Value Ref Range   Sodium 137 135 - 145 mmol/L   Potassium 3.4 (L) 3.5 - 5.1 mmol/L   Chloride 102 101 - 111 mmol/L   CO2 26 22 - 32 mmol/L   Glucose, Bld 100 (H) 65 - 99 mg/dL   BUN 15 6 - 20 mg/dL   Creatinine, Ser 0.98 0.61 - 1.24 mg/dL   Calcium 8.9 8.9 - 10.3 mg/dL   Total Protein 7.5 6.5 - 8.1 g/dL   Albumin 4.5 3.5 - 5.0 g/dL   AST 43 (H) 15 - 41 U/L   ALT 37 17 - 63 U/L   Alkaline Phosphatase 49 38 - 126 U/L   Total Bilirubin 1.4 (H) 0.3 - 1.2 mg/dL   GFR calc non Af Amer >60 >60 mL/min   GFR calc Af Amer >60 >60 mL/min    Comment: (NOTE) The eGFR has been calculated using the CKD EPI equation. This calculation has not been validated in all clinical situations. eGFR's persistently <60 mL/min signify possible Chronic Kidney Disease.    Anion gap 9 5 - 15  Ethanol     Status: None   Collection Time: 12/03/16  3:57 AM  Result Value Ref Range   Alcohol, Ethyl (B) <5 <5 mg/dL    Comment:        LOWEST DETECTABLE LIMIT FOR SERUM ALCOHOL IS 5 mg/dL FOR MEDICAL PURPOSES ONLY   Salicylate level     Status: None   Collection Time: 12/03/16  3:57 AM  Result Value Ref Range   Salicylate Lvl <7.0 2.8 - 30.0 mg/dL  Acetaminophen level     Status: Abnormal     Collection Time: 12/03/16  3:57 AM  Result Value Ref Range   Acetaminophen (Tylenol), Serum <10 (L) 10 - 30 ug/mL    Comment:         THERAPEUTIC CONCENTRATIONS VARY SIGNIFICANTLY. A RANGE OF 10-30 ug/mL MAY BE AN EFFECTIVE CONCENTRATION FOR MANY PATIENTS. HOWEVER, SOME ARE BEST TREATED AT CONCENTRATIONS OUTSIDE THIS RANGE. ACETAMINOPHEN CONCENTRATIONS >150 ug/mL AT 4 HOURS AFTER INGESTION AND >50 ug/mL AT 12 HOURS AFTER INGESTION ARE OFTEN ASSOCIATED WITH TOXIC REACTIONS.   cbc     Status: Abnormal   Collection Time: 12/03/16  3:57 AM  Result Value Ref Range   WBC 8.1 4.0 - 10.5 K/uL   RBC 4.94 4.22 - 5.81 MIL/uL   Hemoglobin 15.2 13.0 - 17.0 g/dL   HCT 41.5 39.0 - 52.0 %   MCV 84.0 78.0 - 100.0 fL   MCH 30.8 26.0 - 34.0 pg   MCHC 36.6 (H) 30.0 - 36.0 g/dL   RDW 12.2 11.5 - 15.5 %   Platelets 373 150 - 400 K/uL    Blood Alcohol level:  Lab Results  Component Value Date   ETH <5 12/03/2016   ETH <5 00/17/4944    Metabolic Disorder Labs:  No results found for: HGBA1C, MPG No results found for: PROLACTIN Lab Results  Component Value Date   CHOL 125 01/10/2016   TRIG 66.0 01/10/2016   HDL 55.40 01/10/2016   CHOLHDL 2 01/10/2016   VLDL 13.2 01/10/2016   LDLCALC 57 01/10/2016    Current Medications: Current Facility-Administered Medications  Medication Dose Route Frequency Provider Last Rate Last Dose  . acetaminophen (TYLENOL) tablet 650 mg  650 mg Oral Q6H PRN Ethelene Hal, NP      . alum & mag hydroxide-simeth (MAALOX/MYLANTA) 200-200-20 MG/5ML suspension 30 mL  30 mL Oral Q4H PRN Ethelene Hal, NP      . feeding supplement (ENSURE ENLIVE) (ENSURE ENLIVE) liquid 237 mL  237 mL Oral BID BM Patriciaann Clan E, PA-C      . hydrOXYzine (ATARAX/VISTARIL) tablet 25 mg  25 mg Oral Q6H PRN Ethelene Hal, NP      . magnesium hydroxide (MILK OF MAGNESIA) suspension 30 mL  30 mL Oral Daily PRN Ethelene Hal, NP      . traZODone (DESYREL) tablet 50 mg  50 mg Oral QHS PRN Ethelene Hal, NP       PTA Medications: Prescriptions Prior to Admission  Medication  Sig Dispense Refill Last Dose  . cloNIDine (CATAPRES) 0.1 MG tablet Take 1 tablet (0.1 mg total) by mouth daily before breakfast. (Patient not taking: Reported on 12/03/2016) 30 tablet 1 Not Taking at Unknown time  . gabapentin (NEURONTIN) 300 MG capsule Take 2 capsules (600 mg total) by mouth 3 (three) times daily. (Patient not taking: Reported on 12/03/2016) 90 capsule 0 Not Taking at Unknown time  . nicotine (NICODERM CQ - DOSED IN MG/24 HOURS) 21 mg/24hr patch Place 1 patch (21 mg total) onto the skin daily. (Patient not taking: Reported on 12/03/2016) 28 patch 0 Not Taking at Unknown time  . risperiDONE (RISPERDAL) 0.5 MG tablet Take 1 tablet (0.5 mg total) by mouth daily. (Patient not taking: Reported on 12/03/2016) 30 tablet 0 Not Taking at Unknown time  . risperiDONE (RISPERDAL) 1 MG tablet Take 1 tablet (1 mg total) by mouth at bedtime. (Patient not taking: Reported on 12/03/2016) 30 tablet 0 Not Taking at Unknown time  . sulfamethoxazole-trimethoprim (BACTRIM DS,SEPTRA DS) 800-160 MG tablet  Take 1 tablet by mouth 2 (two) times daily. (Patient not taking: Reported on 12/03/2016) 14 tablet 0 Completed Course at Unknown time  . traZODone (DESYREL) 100 MG tablet Take 1 tablet (100 mg total) by mouth at bedtime as needed for sleep. (Patient not taking: Reported on 12/03/2016) 30 tablet 0 Not Taking at Unknown time  . Vilazodone HCl (VIIBRYD) 40 MG TABS Take 1 tablet (40 mg total) by mouth daily with supper. (Patient not taking: Reported on 12/03/2016) 30 tablet 0 Not Taking at Unknown time    Musculoskeletal: Strength & Muscle Tone: within normal limits Gait & Station: normal Patient leans: N/A  Psychiatric Specialty Exam: Physical Exam  Vitals reviewed. Constitutional: He is oriented to person, place, and time. He appears well-developed.  Cardiovascular: Normal rate.   Neurological: He is alert and oriented to person, place, and time.  Skin: Skin is warm.  Psychiatric: He has a normal mood and  affect. His behavior is normal.    Review of Systems  Genitourinary: Negative for flank pain.  Psychiatric/Behavioral: Positive for depression, hallucinations and suicidal ideas.    Blood pressure 138/82, pulse 66, temperature 99 F (37.2 C), temperature source Oral, resp. rate 20, height 5' 7" (1.702 m), weight 75.3 kg (166 lb), SpO2 99 %.Body mass index is 26 kg/m.  General Appearance: Casual and Guarded  Eye Contact:  Minimal  Speech:  Clear and Coherent  Volume:  Decreased  Mood:  Anxious, Depressed and Dysphoric  Affect:  Blunt, Depressed and Flat  Thought Process:  Coherent  Orientation:  Full (Time, Place, and Person)  Thought Content:  Hallucinations: None and Rumination  Suicidal Thoughts:  Yes.  with intent/plan  Homicidal Thoughts:  No  Memory:  Immediate;   Fair Recent;   Fair Remote;   Fair  Judgement:  Fair  Insight:  Fair  Psychomotor Activity:  Restlessness  Concentration:  Concentration: Fair  Recall:  Fair  Fund of Knowledge:  Fair  Language:  Good  Akathisia:  No  Handed:  Right  AIMS (if indicated):     Assets:  Communication Skills Desire for Improvement Social Support Talents/Skills  ADL's:  Intact  Cognition:  WNL  Sleep:  Number of Hours: 3.25 (Early morning admission)     I agree with current treatment plan on 12/04/2016, Patient seen face-to-face for psychiatric evaluation follow-up, chart reviewed and case discussed with the MD Kumar.  Reviewed the information documented and agree with the treatment plan.  Treatment Plan Summary: Daily contact with patient to assess and evaluate symptoms and progress in treatment and Medication management   Restarted Neurontin 100 mg PO BID and Seroquel 50mg  PO QHS  for mood stabilization.- with titration  Continue with Trazodone 100 mg for insomnia (PRN)  Will continue to monitor vitals ,medication compliance and treatment side effects while patient is here.  Reviewed labs: BAL -, UDS - positive for  Amphetamine and THC  A1C, Lipid panel, TSH, Prolactin and EKG was order CSW will start working on disposition.  Patient to participate in therapeutic milieu   Observation Level/Precautions:  15 minute checks  Laboratory:  CBC Chemistry Profile UDS UA  Psychotherapy:  Individual and group session  Medications:  See above  Consultations:  Psychiatry  Discharge Concerns: Safety, stabilization, and risk of access to medication and medication stabilization    Estimated LOS: 5-7days  Other:     Physician Treatment Plan for Primary Diagnosis: <principal problem not specified> Long Term Goal(s): Improvement in symptoms so as   ready for discharge  Short Term Goals: Ability to verbalize feelings will improve, Ability to demonstrate self-control will improve, Ability to identify and develop effective coping behaviors will improve and Ability to maintain clinical measurements within normal limits will improve  Physician Treatment Plan for Secondary Diagnosis: Active Problems:   MDD (major depressive disorder), recurrent severe, without psychosis (HCC)  Long Term Goal(s): Improvement in symptoms so as ready for discharge  Short Term Goals: Ability to verbalize feelings will improve, Ability to disclose and discuss suicidal ideas, Ability to demonstrate self-control will improve and Ability to identify triggers associated with substance abuse/mental health issues will improve  I certify that inpatient services furnished can reasonably be expected to improve the patient's condition.    Tanika N Lewis, NP 8/21/201810:44 AM 

## 2016-12-04 NOTE — Progress Notes (Addendum)
Patient ID: [name], male   DOB: Aug 22, 1980, 36 y.o.   MRN: [id number] D) Pt seclusive to self, isolative to room majority of this day. Pt has been negative for groups despite prompting. Pt feeling hopeless and helpless related to relapse. Pt stated that he relapsed while in sober living. Pt worried he says about the relationship with mother of his daughter saying that she has not been supportive in the past. Pt is positive for intermittent s.i. Verbally contracts for safety. EKG completed and placed on chart. A) Level 3 obs for safety, support and encouragement provided. Med ed reinforced. Encourage participation in tx and developing healthy coping skills. R) Cooperative.

## 2016-12-04 NOTE — Progress Notes (Signed)
NUTRITION ASSESSMENT  Pt identified as at risk on the Malnutrition Screen Tool  INTERVENTION: 1. Supplements: Continue Ensure Enlive po BID, each supplement provides 350 kcal and 20 grams of protein  NUTRITION DIAGNOSIS: Unintentional weight loss related to sub-optimal intake as evidenced by pt report.   Goal: Pt to meet >/= 90% of their estimated nutrition needs.  Monitor:  PO intake  Assessment:  Pt admitted with depression and substance abuse(meth, heroin, THC). Pt now homeless. Pt with weight gain. Would still continue to provide Ensure supplements.  Height: Ht Readings from Last 1 Encounters:  12/04/16 5\' 7"  (1.702 m)    Weight: Wt Readings from Last 1 Encounters:  12/04/16 166 lb (75.3 kg)    Weight Hx: Wt Readings from Last 10 Encounters:  12/04/16 166 lb (75.3 kg)  07/09/16 160 lb (72.6 kg)  06/06/16 160 lb (72.6 kg)  05/08/16 165 lb (74.8 kg)  01/20/16 142 lb (64.4 kg)  01/10/16 142 lb (64.4 kg)  12/20/15 146 lb (66.2 kg)  05/27/15 167 lb (75.8 kg)  05/19/15 174 lb (78.9 kg)  12/05/14 160 lb (72.6 kg)    BMI:  Body mass index is 26 kg/m. Pt meets criteria for overweight based on current BMI.  Estimated Nutritional Needs: Kcal: 25-30 kcal/kg Protein: > 1 gram protein/kg Fluid: 1 ml/kcal  Diet Order: Diet regular Room service appropriate? No; Fluid consistency: Thin Pt is also offered choice of unit snacks mid-morning and mid-afternoon.  Pt is eating as desired.   Lab results and medications reviewed.   Damon Bibles, MS, RD, LDN Pager: [phone] After Hours Pager: [phone]

## 2016-12-04 NOTE — Progress Notes (Signed)
Vol admit, 36 yo caucasian male, who presented to [facility] from [facility] reporting increased depression with SI to jump out of a moving car.  Pt reports his depression became worse after he stopped taking his psych meds.  It scared him when he had thoughts of suicide, so he went to the ED.  He has been in conflict with the mother of his child who kicked him out of the home, so he is now homeless.  Pt states he has been using Meth and on occasion using heroin and THC.  He reports that he has been unable to sleep for several days.  Pt denies any major medical issues.  He was appropriate and cooperative with the admission process.  Paperwork was signed and search completed.  Pt was given a snack on admission as requested.  Pt states he was here in March.  He was re-oriented the unit.  Safety checks q15 minutes were initiated.

## 2016-12-04 NOTE — Plan of Care (Signed)
Problem: Safety: Goal: Periods of time without injury will increase Outcome: Progressing Pt has not harmed self or others tonight.  He denies SI/HI and verbally contracts for Probation officer.

## 2016-12-04 NOTE — Progress Notes (Signed)
Recreation Therapy Notes  Animal-Assisted Activity (AAA) Program Checklist/Progress Notes Patient Eligibility Criteria Checklist & Daily Group note for Rec TxIntervention  Date: 08.21.2018 Time: 2:45pm Location: [address]   AAA/T Program Assumption of Risk Form signed by Patient/ or Parent Legal Guardian Yes  Patient is free of allergies or sever asthma Yes  Patient reports no fear of animals Yes  Patient reports no history of cruelty to animals Yes  Patient understands his/her participation is voluntary Yes  Behavioral Response: Did not attend.   [name] Juma Oxley, LRT/CTRS       Narada Uzzle L 12/04/2016 3:12 PM

## 2016-12-04 NOTE — [facility] Group Notes (Signed)
[facility] Group Therapy 12/04/2016 1:15pm  Type of Therapy: Mental Health Association Presentation  Participation Level: Pt invited. Did not attend. Pt was in bed sleeping.   Kara Mead. Marshell Levan, MSW, [facility] 12/04/2016 3:37 PM

## 2016-12-04 NOTE — ED Notes (Signed)
Pt transported to [facility] by [facility] transportation service for continuation of specialized care. Belongings given to driver after patient signed for them. Pt left in no acute distress. 

## 2016-12-05 DIAGNOSIS — F39 Unspecified mood [affective] disorder: Secondary | ICD-10-CM

## 2016-12-05 DIAGNOSIS — F419 Anxiety disorder, unspecified: Secondary | ICD-10-CM

## 2016-12-05 DIAGNOSIS — F1190 Opioid use, unspecified, uncomplicated: Secondary | ICD-10-CM

## 2016-12-05 DIAGNOSIS — F1290 Cannabis use, unspecified, uncomplicated: Secondary | ICD-10-CM

## 2016-12-05 LAB — LIPID PANEL
Cholesterol: 104 mg/dL (ref 0–200)
HDL: 31 mg/dL — AB (ref >40)
LDL CALC: 56 mg/dL (ref 0–99)
Total CHOL/HDL Ratio: 3.4 RATIO
Triglycerides: 86 mg/dL (ref <150)
VLDL: 17 mg/dL (ref 0–40)

## 2016-12-05 LAB — TSH: TSH: 0.449 u[IU]/mL (ref 0.350–4.500)

## 2016-12-05 LAB — HEMOGLOBIN A1C
HEMOGLOBIN A1C: 5.5 % (ref 4.8–5.6)
MEAN PLASMA GLUCOSE: 111.15 mg/dL

## 2016-12-05 MED ORDER — QUETIAPINE FUMARATE 100 MG PO TABS
100.0000 mg | ORAL_TABLET | Freq: Every day | ORAL | Status: DC
  Administered 2016-12-05 – 2016-12-06 (×2): 100 mg via ORAL
  Filled 2016-12-05: qty 14
  Filled 2016-12-05: qty 1
  Filled 2016-12-05: qty 14
  Filled 2016-12-05 (×2): qty 1

## 2016-12-05 NOTE — Progress Notes (Signed)
Adult Psychoeducational Group Note  Date:  12/05/2016 Time:  12:21 AM  Group Topic/Focus:  Wrap-Up Group:   The focus of this group is to help patients review their daily goal of treatment and discuss progress on daily workbooks.  Participation Level:  Did Not Attend  Additional Comments:  Pt did not attend wrap-up group.   [name] 12/05/2016, 12:21 AM

## 2016-12-05 NOTE — Progress Notes (Signed)
Patient ID: [name], male   DOB: 1981-03-25, 36 y.o.   MRN: [id number] D) Pt has been isolative to room, seclusive to self throughout this morning. up for meals then out in the milieu after lunch. Pt guarded, superficial and minimizing. Negative for groups however did go outside for rec. Pt reports appetite remains poor. Pt inquiring about [facility]. Pt c/o "stomach cramping" but no n/v or diarrhea. Denies s.i. This shift. A) Level  3 obs for safety, support and encouragement provided. Med ed reinforced. Encourage pt to communicate with social work. R) Guarded.

## 2016-12-05 NOTE — Tx Team (Signed)
. Interdisciplinary Treatment and Diagnostic Plan Update 12/05/2016 Time of Session: 9:30am  [name]  MRN: [id number]  Principal Diagnosis: Major depressive disorder, recurrent, severe without psychotic features ([place])  Secondary Diagnoses: Principal Problem:   Major depressive disorder, recurrent, severe without psychotic features ([place]) Active Problems:   Polysubstance (including opioids) dependence with physiological dependence ([place])   Amphetamine use disorder, severe ([place])   Current Medications:  Current Facility-Administered Medications  Medication Dose Route Frequency Provider Last Rate Last Dose  . acetaminophen (TYLENOL) tablet 650 mg  650 mg Oral Q6H PRN Ethelene Hal, NP      . alum & mag hydroxide-simeth (MAALOX/MYLANTA) 200-200-20 MG/5ML suspension 30 mL  30 mL Oral Q4H PRN Ethelene Hal, NP      . feeding supplement (ENSURE ENLIVE) (ENSURE ENLIVE) liquid 237 mL  237 mL Oral BID BM Patriciaann Clan E, PA-C   237 mL at 12/05/16 1509  . gabapentin (NEURONTIN) capsule 100 mg  100 mg Oral BID Derrill Center, NP   100 mg at 12/05/16 0810  . hydrOXYzine (ATARAX/VISTARIL) tablet 25 mg  25 mg Oral Q6H PRN Ethelene Hal, NP      . magnesium hydroxide (MILK OF MAGNESIA) suspension 30 mL  30 mL Oral Daily PRN Ethelene Hal, NP      . QUEtiapine (SEROQUEL) tablet 100 mg  100 mg Oral QHS Money, Lowry Ram, FNP      . traZODone (DESYREL) tablet 50 mg  50 mg Oral QHS PRN Ethelene Hal, NP        PTA Medications: Prescriptions Prior to Admission  Medication Sig Dispense Refill Last Dose  . cloNIDine (CATAPRES) 0.1 MG tablet Take 1 tablet (0.1 mg total) by mouth daily before breakfast. (Patient not taking: Reported on 12/03/2016) 30 tablet 1 Not Taking at Unknown time  . gabapentin (NEURONTIN) 300 MG capsule Take 2 capsules (600 mg total) by mouth 3 (three) times daily. (Patient not taking: Reported on 12/03/2016) 90 capsule 0 Not Taking at  Unknown time  . nicotine (NICODERM CQ - DOSED IN MG/24 HOURS) 21 mg/24hr patch Place 1 patch (21 mg total) onto the skin daily. (Patient not taking: Reported on 12/03/2016) 28 patch 0 Not Taking at Unknown time  . risperiDONE (RISPERDAL) 0.5 MG tablet Take 1 tablet (0.5 mg total) by mouth daily. (Patient not taking: Reported on 12/03/2016) 30 tablet 0 Not Taking at Unknown time  . risperiDONE (RISPERDAL) 1 MG tablet Take 1 tablet (1 mg total) by mouth at bedtime. (Patient not taking: Reported on 12/03/2016) 30 tablet 0 Not Taking at Unknown time  . sulfamethoxazole-trimethoprim (BACTRIM DS,SEPTRA DS) 800-160 MG tablet Take 1 tablet by mouth 2 (two) times daily. (Patient not taking: Reported on 12/03/2016) 14 tablet 0 Completed Course at Unknown time  . traZODone (DESYREL) 100 MG tablet Take 1 tablet (100 mg total) by mouth at bedtime as needed for sleep. (Patient not taking: Reported on 12/03/2016) 30 tablet 0 Not Taking at Unknown time  . Vilazodone HCl (VIIBRYD) 40 MG TABS Take 1 tablet (40 mg total) by mouth daily with supper. (Patient not taking: Reported on 12/03/2016) 30 tablet 0 Not Taking at Unknown time    Treatment Modalities: Medication Management, Group therapy, Case management,  1 to 1 session with clinician, Psychoeducation, Recreational therapy.  Patient Stressors: Arts development officer issue Marital or family conflict Medication change or noncompliance Substance abuse Patient Strengths: Ability for insight Average or above average intelligence Capable of independent living  General fund of knowledge  Physician Treatment Plan for Primary Diagnosis: Major depressive disorder, recurrent, severe without psychotic features ([place]) Long Term Goal(s): Improvement in symptoms so as ready for discharge Short Term Goals: Ability to verbalize feelings will improve Ability to demonstrate self-control will improve Ability to identify and develop effective coping behaviors will  improve Ability to maintain clinical measurements within normal limits will improve Ability to verbalize feelings will improve Ability to disclose and discuss suicidal ideas Ability to demonstrate self-control will improve Ability to identify triggers associated with substance abuse/mental health issues will improve  Medication Management: Evaluate patient's response, side effects, and tolerance of medication regimen.  Therapeutic Interventions: 1 to 1 sessions, Unit Group sessions and Medication administration.  Evaluation of Outcomes: Progressing  Physician Treatment Plan for Secondary Diagnosis: Principal Problem:   Major depressive disorder, recurrent, severe without psychotic features ([place]) Active Problems:   Polysubstance (including opioids) dependence with physiological dependence ([facility])   Amphetamine use disorder, severe ([place])  Long Term Goal(s): Improvement in symptoms so as ready for discharge  Short Term Goals: Ability to verbalize feelings will improve Ability to demonstrate self-control will improve Ability to identify and develop effective coping behaviors will improve Ability to maintain clinical measurements within normal limits will improve Ability to verbalize feelings will improve Ability to disclose and discuss suicidal ideas Ability to demonstrate self-control will improve Ability to identify triggers associated with substance abuse/mental health issues will improve  Medication Management: Evaluate patient's response, side effects, and tolerance of medication regimen.  Therapeutic Interventions: 1 to 1 sessions, Unit Group sessions and Medication administration.  Evaluation of Outcomes: Progressing  RN Treatment Plan for Primary Diagnosis: Major depressive disorder, recurrent, severe without psychotic features ([place]) Long Term Goal(s): Knowledge of disease and therapeutic regimen to maintain health will improve  Short Term Goals: Ability to verbalize feelings  will improve and Compliance with prescribed medications will improve  Medication Management: RN will administer medications as ordered by provider, will assess and evaluate patient's response and provide education to patient for prescribed medication. RN will report any adverse and/or side effects to prescribing provider.  Therapeutic Interventions: 1 on 1 counseling sessions, Psychoeducation, Medication administration, Evaluate responses to treatment, Monitor vital signs and CBGs as ordered, Perform/monitor CIWA, COWS, AIMS and Fall Risk screenings as ordered, Perform wound care treatments as ordered.  Evaluation of Outcomes: Progressing  LCSW Treatment Plan for Primary Diagnosis: Major depressive disorder, recurrent, severe without psychotic features ([place]) Long Term Goal(s): Safe transition to appropriate next level of care at discharge, Engage patient in therapeutic group addressing interpersonal concerns. Short Term Goals: Engage patient in aftercare planning with referrals and resources, Increase emotional regulation, Facilitate patient progression through stages of change regarding substance use diagnoses and concerns, Identify triggers associated with mental health/substance abuse issues and Increase skills for wellness and recovery  Therapeutic Interventions: Assess for all discharge needs, 1 to 1 time with Social worker, Explore available resources and support systems, Assess for adequacy in community support network, Educate family and significant other(s) on suicide prevention, Complete Psychosocial Assessment, Interpersonal group therapy.  Evaluation of Outcomes: Progressing  Progress in Treatment: Attending groups: No Participating in groups: No Taking medication as prescribed: Yes, MD continues to assess for medication changes as needed Toleration medication: Yes, no side effects reported at this time Family/Significant other contact made: No, CSW attempting contact with pt's  girlfriend. Patient understands diagnosis: Developing insight  Discussing patient identified problems/goals with staff: Yes Medical problems stabilized or resolved: Yes Denies suicidal/homicidal  ideation: Yes Issues/concerns per patient self-inventory: None Other: N/A  New problem(s) identified: None identified at this time.   New Short Term/Long Term Goal(s): None identified at this time.   Discharge Plan or Barriers: Pt will likely discharge to [facility] or [facility] for residential treatment.  Reason for Continuation of Hospitalization:  Anxiety  Depression Medication stabilization Suicidal ideation Withdrawal symptoms  Estimated Length of Stay: 1-3 days; Estimated discharge date 12/08/16  Attendees: Patient: 12/05/2016 4:41 PM  Physician: Dr. Dwyane Dee; Dr. Shea Evans 12/05/2016 4:41 PM  Nursing: Trinna Post RN; [place], RN 12/05/2016 4:41 PM  RN Care Manager: Lars Pinks, RN 12/05/2016 4:41 PM  Social Worker: Matthew Saras, [place] 12/05/2016 4:41 PM  Recreational Therapist:  12/05/2016 4:41 PM  Other: Lindell Spar, NP 12/05/2016 4:41 PM  Other:  12/05/2016 4:41 PM  Other: 12/05/2016 4:41 PM  Scribe for Treatment Team: Georga Kaufmann, MSW,LCSWA 12/05/2016 4:41 PM

## 2016-12-05 NOTE — Progress Notes (Signed)
[facility] MD Progress Note  12/05/2016 6:16 PM [name]  MRN:  [id number]   Subjective:  Patient reports feeling better today, but still has some mild depression. He denies any SI/HI/AVH and contracts for safety on the unit. He reports that he was on Seroquel 150 mg PO QHS for stability in the past. He states that the Neurontin and Trazodone have both helped as well.   Objective: Patient is pleasant and cooperative. Since he reports a history of Seroquel 150 mg use for stability, I will increase it to 100mg  PO QHS for now and possibly titrate as needed.   Principal Problem: Major depressive disorder, recurrent, severe without psychotic features ([place]) Diagnosis:   Patient Active Problem List   Diagnosis Date Noted  . Amphetamine use disorder, severe ([place]) [F15.20] 07/10/2016  . Polysubstance (including opioids) dependence with physiological dependence ([place]) [F19.20] 07/09/2016  . Encounter for well adult exam with abnormal findings [X10.62] 12/20/2015  . Bruises easily [R23.8] 12/20/2015  . Squamous cell carcinoma [C44.92] 05/19/2015  . Itching [L29.9] 05/19/2015  . Major depressive disorder, recurrent, severe without psychotic features ([place]) [F33.2]   . Anxiety disorder [F41.9] 12/05/2014  . 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 Anemia   . Anxiety   . Arthritis   . Depression   . Hepatitis C   . History of kidney stones   . Kidney stone   . Opiate dependence ([place])     Past Surgical History:  Procedure Laterality Date  . APPENDECTOMY    . left axillary node biopsy    . RADICAL NECK DISSECTION Left   . TONSILLECTOMY     Family History:  Family History  Problem Relation Age of Onset  . Kidney cancer Father        renal cell carcenoma  . Hodgkin's lymphoma Mother   . Colon cancer Paternal Grandfather        great  . Lung  cancer Paternal Grandmother   . Diabetes Maternal Grandmother   . Pancreatic cancer Maternal Grandmother   . Melanoma Maternal Grandfather    Family Psychiatric  History: See H&P Social History:  History  Alcohol Use  . Yes    Comment: "rarely"     History  Drug Use  . Frequency: 7.0 times per week  . Types: Marijuana, Methamphetamines    Comment: stopped marijuana in Aug 2017    Social History   Social History  . Marital status: Single    Spouse name: N/A  . Number of children: 1  . Years of education: 9   Occupational History  . Mechanic     Social History Main Topics  . Smoking status: Current Some Day Smoker    Packs/day: 0.50    Years: 9.00    Types: Cigarettes  . Smokeless tobacco: Former Systems developer    Types: Chew    Quit date: 04/16/2008  . Alcohol use Yes     Comment: "rarely"  . Drug use: Yes    Frequency: 7.0 times per week    Types: Marijuana, Methamphetamines     Comment: stopped marijuana in Aug 2017  . Sexual activity: Not Currently   Other Topics Concern  . None   Social History Narrative   Fun: Dietitian and hang out with his daughter   Additional Social History:    Pain Medications: See home  med list Prescriptions: See home med list Over the Counter: See home med list History of alcohol / drug use?: Yes Longest period of sobriety (when/how long): 4.5 yrs Negative Consequences of Use: Personal relationships, Financial Name of Substance 1: ETH 1 - Age of First Use: teens  1 - Amount (size/oz): 2 beers 1 - Frequency: occasionally 1 - Duration: ongoing 1 - Last Use / Amount: 12/02/16 Name of Substance 2: Meth 2 - Age of First Use: teens 2 - Amount (size/oz): 0.5 gram 2 - Frequency: weekly 2 - Duration: ongoing 2 - Last Use / Amount: 12/01/16 Name of Substance 3: THC 3 - Age of First Use: teens 3 - Amount (size/oz): 1 joint 3 - Frequency: weekly 3 - Duration: ongoing 3 - Last Use / Amount: 11/29/16 Name of Substance 4: heroin 4 - Age  of First Use: teens 4 - Amount (size/oz): 0.25 gram 4 - Frequency: once or twice a month 4 - Duration: ongoing 4 - Last Use / Amount: Some time in July            Sleep: Good  Appetite:  Good  Current Medications: Current Facility-Administered Medications  Medication Dose Route Frequency Provider Last Rate Last Dose  . acetaminophen (TYLENOL) tablet 650 mg  650 mg Oral Q6H PRN Ethelene Hal, NP      . alum & mag hydroxide-simeth (MAALOX/MYLANTA) 200-200-20 MG/5ML suspension 30 mL  30 mL Oral Q4H PRN Ethelene Hal, NP      . feeding supplement (ENSURE ENLIVE) (ENSURE ENLIVE) liquid 237 mL  237 mL Oral BID BM Patriciaann Clan E, PA-C   237 mL at 12/05/16 1509  . gabapentin (NEURONTIN) capsule 100 mg  100 mg Oral BID Derrill Center, NP   100 mg at 12/05/16 1713  . hydrOXYzine (ATARAX/VISTARIL) tablet 25 mg  25 mg Oral Q6H PRN Ethelene Hal, NP      . magnesium hydroxide (MILK OF MAGNESIA) suspension 30 mL  30 mL Oral Daily PRN Ethelene Hal, NP      . QUEtiapine (SEROQUEL) tablet 100 mg  100 mg Oral QHS Dat Derksen, Lowry Ram, FNP      . traZODone (DESYREL) tablet 50 mg  50 mg Oral QHS PRN Ethelene Hal, NP        Lab Results:  Results for orders placed or performed during the hospital encounter of 12/04/16 (from the past 48 hour(s))  Lipid panel     Status: Abnormal   Collection Time: 12/05/16  6:18 AM  Result Value Ref Range   Cholesterol 104 0 - 200 mg/dL   Triglycerides 86 <150 mg/dL   HDL 31 (L) >40 mg/dL   Total CHOL/HDL Ratio 3.4 RATIO   VLDL 17 0 - 40 mg/dL   LDL Cholesterol 56 0 - 99 mg/dL    Comment:        Total Cholesterol/HDL:CHD Risk Coronary Heart Disease Risk Table                     Men   Women  1/2 Average Risk   3.4   3.3  Average Risk       5.0   4.4  2 X Average Risk   9.6   7.1  3 X Average Risk  23.4   11.0        Use the calculated Patient Ratio above and the CHD Risk Table to determine the patient's CHD Risk.  ATP III CLASSIFICATION (LDL):  <100     mg/dL   Optimal  100-129  mg/dL   Near or Above                    Optimal  130-159  mg/dL   Borderline  160-189  mg/dL   High  >190     mg/dL   Very High Performed at [facility] [address], [place], [place] [address]   Hemoglobin A1c     Status: None   Collection Time: 12/05/16  6:18 AM  Result Value Ref Range   Hgb A1c MFr Bld 5.5 4.8 - 5.6 %    Comment: (NOTE) Pre diabetes:          5.7%-6.4% Diabetes:              >6.4% Glycemic control for   <7.0% adults with diabetes    Mean Plasma Glucose 111.15 mg/dL    Comment: Performed at [facility] [address], [place], [place] [address]  TSH     Status: None   Collection Time: 12/05/16  6:18 AM  Result Value Ref Range   TSH 0.449 0.350 - 4.500 uIU/mL    Comment: Performed by a 3rd Generation assay with a functional sensitivity of <=0.01 uIU/mL. Performed at [facility], [place] [address] [place], [place] [address]     Blood Alcohol level:  Lab Results  Component Value Date   [facility] <5 12/03/2016   ETH <5 53/29/9242    Metabolic Disorder Labs: Lab Results  Component Value Date   HGBA1C 5.5 12/05/2016   MPG 111.15 12/05/2016   No results found for: PROLACTIN Lab Results  Component Value Date   CHOL 104 12/05/2016   TRIG 86 12/05/2016   HDL 31 (L) 12/05/2016   CHOLHDL 3.4 12/05/2016   VLDL 17 12/05/2016   LDLCALC 56 12/05/2016   LDLCALC 57 01/10/2016    Physical Findings: AIMS: Facial and Oral Movements Muscles of Facial Expression: None, normal Lips and Perioral Area: None, normal Jaw: None, normal Tongue: None, normal,Extremity Movements Upper (arms, wrists, hands, fingers): None, normal Lower (legs, knees, ankles, toes): None, normal, Trunk Movements Neck, shoulders, hips: None, normal, Overall Severity Severity of abnormal movements (highest score from questions above): None, normal Incapacitation due to abnormal  movements: None, normal Patient's awareness of abnormal movements (rate only patient's report): No Awareness, Dental Status Current problems with teeth and/or dentures?: No Does patient usually wear dentures?: No  CIWA:  CIWA-Ar Total: 3 COWS:  COWS Total Score: 2  Musculoskeletal: Strength & Muscle Tone: within normal limits Gait & Station: normal Patient leans: N/A  Psychiatric Specialty Exam: Physical Exam  Nursing note and vitals reviewed. Constitutional: He is oriented to person, place, and time. He appears well-developed and well-nourished.  Cardiovascular: Normal rate.   Respiratory: Effort normal.  Musculoskeletal: Normal range of motion.  Neurological: He is alert and oriented to person, place, and time.  Skin: Skin is warm.    Review of Systems  Constitutional: Negative.   HENT: Negative.   Eyes: Negative.   Respiratory: Negative.   Cardiovascular: Negative.   Gastrointestinal: Negative.   Genitourinary: Negative.   Musculoskeletal: Negative.   Skin: Negative.   Neurological: Negative.   Endo/Heme/Allergies: Negative.     Blood pressure 117/81, pulse 71, temperature 97.9 F (36.6 C), temperature source Oral, resp. rate 16, height 5\' 7"  (1.702 m), weight 75.3 kg (166 lb), SpO2 99 %.Body  mass index is 26 kg/m.  General Appearance: Casual  Eye Contact:  Good  Speech:  Clear and Coherent and Normal Rate  Volume:  Normal  Mood:  Depressed  Affect:  Flat  Thought Process:  Coherent and Descriptions of Associations: Intact  Orientation:  Full (Time, Place, and Person)  Thought Content:  WDL  Suicidal Thoughts:  No  Homicidal Thoughts:  No  Memory:  Immediate;   Good Recent;   Good  Judgement:  Good  Insight:  Good  Psychomotor Activity:  Normal  Concentration:  Concentration: Good  Recall:  Good  Fund of Knowledge:  Good  Language:  Good  Akathisia:  No  Handed:  Right  AIMS (if indicated):     Assets:  Desire for Improvement Financial  Resources/Insurance Social Support  ADL's:  Intact  Cognition:  WNL  Sleep:  Number of Hours: 6     Treatment Plan Summary: Daily contact with patient to assess and evaluate symptoms and progress in treatment, Medication management and Plan is to:  -Increase Seroquel 100 mg PO QHS for mood stability -Continue Gabapentin 100 mg PO BID for mood stability -Continue Trazodone 50 mg PO QHS PRN for insomnia -Continue Vistari 25 mg PO Q6H PRN for anxiety -Encourage group therapy treatment  Lewis Shock, FNP 12/05/2016, 6:16 PM

## 2016-12-05 NOTE — Progress Notes (Signed)
D: Pt was in bed in his room upon initial approach.  Pt presents with depressed affect and mood.  He describes his day as "pretty good" but then reports "I'm just super depressed."  His goal is to "go to NA" and pt did attend group tonight.  Pt denies SI/HI, denies hallucinations, denies pain.  Pt has been visible in milieu interacting with peers  appropriately.    A: Introduced self to pt.  Actively listened to pt and offered support and encouragement. Medication administered per order.  Encouraged pt to attend groups.  Q15 minute safety checks maintained.  R: Pt is safe on the unit.  Pt is compliant with medication.  He is receptive to staff's encouragement.  Pt verbally contracts for safety.  Will continue to monitor and assess.

## 2016-12-05 NOTE — Progress Notes (Signed)
Per pt request, CSW sent a letter stating that pt is currently in the hospital to [facility] via fax number [phone].  Georga Kaufmann, MSW, [place]

## 2016-12-05 NOTE — [facility] Group Notes (Signed)
[facility] LCSW Group Therapy 12/05/2016 1:15pm  Type of Therapy and Topic: Group Therapy: Avoiding Self-Sabotaging and Enabling Behaviors   Participation Level: Pt invited. Did not attend.   Matthew Saras, MSW, LCSWA 12/05/2016 4:11 PM

## 2016-12-05 NOTE — Plan of Care (Signed)
Problem: Role Relationship: Goal: Ability to demonstrate positive changes in social behaviors and relationships will improve Outcome: Progressing With encouragement, pt left his room and attended evening group.  He then was seen interacting with peers.

## 2016-12-06 LAB — PROLACTIN: Prolactin: 12 ng/mL (ref 4.0–15.2)

## 2016-12-06 MED ORDER — NICOTINE POLACRILEX 2 MG MT GUM
2.0000 mg | CHEWING_GUM | OROMUCOSAL | Status: DC | PRN
  Administered 2016-12-06: 2 mg via ORAL
  Filled 2016-12-06: qty 1

## 2016-12-06 NOTE — Plan of Care (Signed)
Problem: Activity: Goal: Sleeping patterns will improve Outcome: Progressing Pt reports he slept well last night.  Slept 6.75 hours according to flowsheet.

## 2016-12-06 NOTE — Progress Notes (Signed)
D: Pt was at the nurse's station upon initial approach.  Pt presents with appropriate affect and mood.  Pt was concerned that he may not be able to visit with his daughter.  [facility] MHT went with pt to visit with daughter and pt's girlfriend in the cafeteria for 30 minutes.  He reports he had a "good" visit.  Goal was to "find a place to go" and pt is going to "[facility] in [place], I discharge tomorrow."  Pt reports he feels safe to discharge.  Pt denies SI/HI, denies hallucinations, denies pain.  Pt has been visible in milieu interacting with peers and staff appropriately.  Pt attended evening group.    A: Met with pt 1:1.  Actively listened to pt and offered support and encouragement. Medication administered per order.  Q15 minute safety checks maintained.  R: Pt is safe on the unit.  Pt is compliant with medication.  Pt verbally contracts for safety.  Will continue to monitor and assess.

## 2016-12-06 NOTE — Progress Notes (Signed)
D:Pt rates depression as a 4 and anxiety as a 2 on 0-10 scale with 10 being the most. Pt was in bed this morning sleepy following breakfast. He came to group later in the day. Pt is looking for after discharge placement.  A:Offered support, encouragement and 15 minute checks. R:Pt denies si and hi. Safety maintained on the unit.

## 2016-12-06 NOTE — [facility] Group Notes (Signed)
[facility] LCSW Group Therapy  12/06/2016 3:17 PM  Type of Therapy:  Group Therapy  Participation Level:  Active  Participation Quality:  Attentive  Affect:  Appropriate  Cognitive:  Alert and Oriented  Insight:  Engaged  Engagement in Therapy:  Engaged  Modes of Intervention:  Confrontation, Discussion, Education, Socialization and Support  Summary of Progress/Problems: Today's Topic: Overcoming Obstacles. Patients identified one short term goal and potential obstacles in reaching this goal. Patients processed barriers involved in overcoming these obstacles. Patients identified steps necessary for overcoming these obstacles and explored motivation (internal and external) for facing these difficulties head on.    [name] N Smart LCSW 12/06/2016, 3:17 PM

## 2016-12-06 NOTE — Progress Notes (Signed)
[facility] referral made.   Maxie Better, MSW, LCSW Clinical Social Worker 12/06/2016 10:54 AM

## 2016-12-06 NOTE — Progress Notes (Signed)
[facility] Group Notes:  (Nursing/MHT/Case Management/Adjunct)  Date:  12/06/2016  Time:  2030 Type of Therapy:  wrap up group  Participation Level:  Active  Participation Quality:  Appropriate, Attentive, Sharing and Supportive  Affect:  Anxious and Appropriate  Cognitive:  Appropriate  Insight:  Improving  Engagement in Group:  Engaged  Modes of Intervention:  Clarification, Education and Support  Summary of Progress/Problems:Pt enjoyed his visit with is 82.30 year old daughter and her mother "I guess my girlfriend". Pt shared that if he could change any one thing about his life it would be his substance abuse. Pt reports being grateful for his supportive family and the treatment center in [place] he plans on discharging to.   Shellia Cleverly 12/06/2016, 10:32 PM

## 2016-12-06 NOTE — Plan of Care (Signed)
Problem: Health Behavior/Discharge Planning: Goal: Identification of resources available to assist in meeting health care needs will improve Outcome: Progressing Pt is working with his Education officer, museum to find aftercare placement.

## 2016-12-06 NOTE — Progress Notes (Signed)
CSW spoke with Deon in admissions at [facility] to confirm that pt has been accepted for treatment tomorrow (12/07/16). A driver from that facility will pick up patient at approximately 1:00PM. 30 day prescription required-they can fill at a pharmacy once at facility.  Maxie Better, MSW, LCSW Clinical Social Worker 12/06/2016 3:28 PM

## 2016-12-06 NOTE — Progress Notes (Signed)
[facility] MD Progress Note  12/06/2016 1:00 PM [name]  MRN:  [id number]   Subjective:  Patient states that he feels depressed in the mornings and he knows that he is worrying about things he can't take care of right now. One problem is that his tools are in [place] at his old job. His car is broke down, and he has no where to live.He reports that he moved to [facility] in [place] from [place], but he has no friends. He can move back in with his "baby's momma" if he goes through rehab. "I want to go to [facility] or [facility] for residential."   Objective: Patient is future oriented and wants to get clean so he can live with his child. He denies any SI/HI/AVH and contracts for safety. He will continue his current medications. He has not been attending all groups, but agrees to attend more. He denies any side effects from the medications  Principal Problem: Major depressive disorder, recurrent, severe without psychotic features ([place]) Diagnosis:   Patient Active Problem List   Diagnosis Date Noted  . 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 Polysubstance (including opioids) dependence with physiological dependence ([place]) [F19.20] 07/09/2016  . Encounter for well adult exam with abnormal findings [I14.43] 12/20/2015  . Bruises easily [R23.8] 12/20/2015  . Squamous cell carcinoma [C44.92] 05/19/2015  . Itching [L29.9] 05/19/2015  . Major depressive disorder, recurrent, severe without psychotic features ([place]) [F33.2]   . Anxiety disorder [F41.9] 12/05/2014  . 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 Anemia   . Anxiety   . Arthritis   . Depression   . Hepatitis C   . History of kidney stones   . Kidney stone   . Opiate dependence ([place])     Past Surgical History:  Procedure Laterality Date  . APPENDECTOMY    . left axillary node biopsy     . RADICAL NECK DISSECTION Left   . TONSILLECTOMY     Family History:  Family History  Problem Relation Age of Onset  . Kidney cancer Father        renal cell carcenoma  . Hodgkin's lymphoma Mother   . Colon cancer Paternal Grandfather        great  . Lung cancer Paternal Grandmother   . Diabetes Maternal Grandmother   . Pancreatic cancer Maternal Grandmother   . Melanoma Maternal Grandfather    Family Psychiatric  History: See H&P Social History:  History  Alcohol Use  . Yes    Comment: "rarely"     History  Drug Use  . Frequency: 7.0 times per week  . Types: Marijuana, Methamphetamines    Comment: stopped marijuana in Aug 2017    Social History   Social History  . Marital status: Single    Spouse name: N/A  . Number of children: 1  . Years of education: 19   Occupational History  . Mechanic     Social History Main Topics  . Smoking status: Current Some Day Smoker    Packs/day: 0.50    Years: 9.00    Types: Cigarettes  . Smokeless tobacco: Former Systems developer    Types: Chew    Quit date: 04/16/2008  . Alcohol use Yes     Comment: "rarely"  . Drug use: Yes    Frequency: 7.0 times per week  Types: Marijuana, Methamphetamines     Comment: stopped marijuana in Aug 2017  . Sexual activity: Not Currently   Other Topics Concern  . None   Social History Narrative   Fun: Dietitian and hang out with his daughter   Additional Social History:    Pain Medications: See home med list Prescriptions: See home med list Over the Counter: See home med list History of alcohol / drug use?: Yes Longest period of sobriety (when/how long): 4.5 yrs Negative Consequences of Use: Personal relationships, Financial Name of Substance 1: ETH 1 - Age of First Use: teens  1 - Amount (size/oz): 2 beers 1 - Frequency: occasionally 1 - Duration: ongoing 1 - Last Use / Amount: 12/02/16 Name of Substance 2: Meth 2 - Age of First Use: teens 2 - Amount (size/oz): 0.5 gram 2 -  Frequency: weekly 2 - Duration: ongoing 2 - Last Use / Amount: 12/01/16 Name of Substance 3: THC 3 - Age of First Use: teens 3 - Amount (size/oz): 1 joint 3 - Frequency: weekly 3 - Duration: ongoing 3 - Last Use / Amount: 11/29/16 Name of Substance 4: heroin 4 - Age of First Use: teens 4 - Amount (size/oz): 0.25 gram 4 - Frequency: once or twice a month 4 - Duration: ongoing 4 - Last Use / Amount: Some time in July            Sleep: Good  Appetite:  Good  Current Medications: Current Facility-Administered Medications  Medication Dose Route Frequency Provider Last Rate Last Dose  . acetaminophen (TYLENOL) tablet 650 mg  650 mg Oral Q6H PRN Ethelene Hal, NP      . alum & mag hydroxide-simeth (MAALOX/MYLANTA) 200-200-20 MG/5ML suspension 30 mL  30 mL Oral Q4H PRN Ethelene Hal, NP      . feeding supplement (ENSURE ENLIVE) (ENSURE ENLIVE) liquid 237 mL  237 mL Oral BID BM Patriciaann Clan E, PA-C   237 mL at 12/06/16 0919  . gabapentin (NEURONTIN) capsule 100 mg  100 mg Oral BID Derrill Center, NP   100 mg at 12/06/16 4097  . hydrOXYzine (ATARAX/VISTARIL) tablet 25 mg  25 mg Oral Q6H PRN Ethelene Hal, NP      . magnesium hydroxide (MILK OF MAGNESIA) suspension 30 mL  30 mL Oral Daily PRN Ethelene Hal, NP      . QUEtiapine (SEROQUEL) tablet 100 mg  100 mg Oral QHS Kaiyden Simkin B, FNP   100 mg at 12/05/16 2123  . traZODone (DESYREL) tablet 50 mg  50 mg Oral QHS PRN Ethelene Hal, NP        Lab Results:  Results for orders placed or performed during the hospital encounter of 12/04/16 (from the past 48 hour(s))  Lipid panel     Status: Abnormal   Collection Time: 12/05/16  6:18 AM  Result Value Ref Range   Cholesterol 104 0 - 200 mg/dL   Triglycerides 86 <150 mg/dL   HDL 31 (L) >40 mg/dL   Total CHOL/HDL Ratio 3.4 RATIO   VLDL 17 0 - 40 mg/dL   LDL Cholesterol 56 0 - 99 mg/dL    Comment:        Total Cholesterol/HDL:CHD Risk Coronary  Heart Disease Risk Table                     Men   Women  1/2 Average Risk   3.4   3.3  Average Risk  5.0   4.4  2 X Average Risk   9.6   7.1  3 X Average Risk  23.4   11.0        Use the calculated Patient Ratio above and the CHD Risk Table to determine the patient's CHD Risk.        ATP III CLASSIFICATION (LDL):  <100     mg/dL   Optimal  100-129  mg/dL   Near or Above                    Optimal  130-159  mg/dL   Borderline  160-189  mg/dL   High  >190     mg/dL   Very High Performed at [facility] [address], [place], [place] [address]   Hemoglobin A1c     Status: None   Collection Time: 12/05/16  6:18 AM  Result Value Ref Range   Hgb A1c MFr Bld 5.5 4.8 - 5.6 %    Comment: (NOTE) Pre diabetes:          5.7%-6.4% Diabetes:              >6.4% Glycemic control for   <7.0% adults with diabetes    Mean Plasma Glucose 111.15 mg/dL    Comment: Performed at [facility] [address] [place], [place] [address]  TSH     Status: None   Collection Time: 12/05/16  6:18 AM  Result Value Ref Range   TSH 0.449 0.350 - 4.500 uIU/mL    Comment: Performed by a 3rd Generation assay with a functional sensitivity of <=0.01 uIU/mL. Performed at [facility], [place] [address] [place], [place] [address]   Prolactin     Status: None   Collection Time: 12/05/16  6:18 AM  Result Value Ref Range   Prolactin 12.0 4.0 - 15.2 ng/mL    Comment: (NOTE) Performed At: [facility] [place], [place] [id number] Lindon Romp MD [id number] Performed at [facility], [place] [address], [place], [place] [address]     Blood Alcohol level:  Lab Results  Component Value Date   [facility] <5 12/03/2016   ETH <5 15/40/0867    Metabolic Disorder Labs: Lab Results  Component Value Date   HGBA1C 5.5 12/05/2016   MPG 111.15 12/05/2016   Lab Results  Component Value Date   PROLACTIN 12.0 12/05/2016   Lab Results   Component Value Date   CHOL 104 12/05/2016   TRIG 86 12/05/2016   HDL 31 (L) 12/05/2016   CHOLHDL 3.4 12/05/2016   VLDL 17 12/05/2016   LDLCALC 56 12/05/2016   LDLCALC 57 01/10/2016    Physical Findings: AIMS: Facial and Oral Movements Muscles of Facial Expression: None, normal Lips and Perioral Area: None, normal Jaw: None, normal Tongue: None, normal,Extremity Movements Upper (arms, wrists, hands, fingers): None, normal Lower (legs, knees, ankles, toes): None, normal, Trunk Movements Neck, shoulders, hips: None, normal, Overall Severity Severity of abnormal movements (highest score from questions above): None, normal Incapacitation due to abnormal movements: None, normal Patient's awareness of abnormal movements (rate only patient's report): No Awareness, Dental Status Current problems with teeth and/or dentures?: No Does patient usually wear dentures?: No  CIWA:  CIWA-Ar Total: 3 COWS:  COWS Total Score: 2  Musculoskeletal: Strength & Muscle Tone: within normal limits Gait & Station: normal Patient leans: N/A  Psychiatric Specialty Exam: Physical Exam  Nursing note and vitals reviewed.  Constitutional: He is oriented to person, place, and time. He appears well-developed and well-nourished.  Cardiovascular: Normal rate.   Respiratory: Effort normal.  Musculoskeletal: Normal range of motion.  Neurological: He is alert and oriented to person, place, and time.  Skin: Skin is warm.    Review of Systems  Constitutional: Negative.   HENT: Negative.   Eyes: Negative.   Respiratory: Negative.   Cardiovascular: Negative.   Gastrointestinal: Negative.   Genitourinary: Negative.   Musculoskeletal: Negative.   Skin: Negative.   Neurological: Negative.   Endo/Heme/Allergies: Negative.     Blood pressure 115/84, pulse 78, temperature 99.1 F (37.3 C), temperature source Oral, resp. rate 20, height 5\' 7"  (1.702 m), weight 75.3 kg (166 lb), SpO2 99 %.Body mass index is 26  kg/m.  General Appearance: Disheveled  Eye Contact:  Fair  Speech:  Clear and Coherent and Normal Rate  Volume:  Normal  Mood:  Depressed  Affect:  Flat  Thought Process:  Coherent and Descriptions of Associations: Intact  Orientation:  Full (Time, Place, and Person)  Thought Content:  WDL  Suicidal Thoughts:  No  Homicidal Thoughts:  No  Memory:  Immediate;   Good Recent;   Good  Judgement:  Fair  Insight:  Good  Psychomotor Activity:  Normal  Concentration:  Concentration: Good  Recall:  Good  Fund of Knowledge:  Good  Language:  Good  Akathisia:  No  Handed:  Right  AIMS (if indicated):     Assets:  Desire for Improvement Housing Social Support  ADL's:  Intact  Cognition:  WNL  Sleep:  Number of Hours: 6.75     Treatment Plan Summary: Daily contact with patient to assess and evaluate symptoms and progress in treatment, Medication management and Plan is to:  -Continue Seroquel 100 mg PO QHS for mood stability -Continue Gabapentin 100 mg PO BID for mood stability -Continue Trazodone 50 mg PO QHS PRN for insomnia -Continue Vistaril 25 mg PO Q^H PRN for anxiety -Encourage group therapy participation -SW staff to refer to [facility] or [facility] residential  Lewis Shock, FNP 12/06/2016, 1:00 PM

## 2016-12-06 NOTE — BHH Suicide Risk Assessment (Signed)
[name] INPATIENT:  Family/Significant Other Suicide Prevention Education  Suicide Prevention Education:  Education Completed; Thayer Ohm (pt's girlfriend) [phone] has been identified by the patient as the family member/significant other with whom the patient will be residing, and identified as the person(s) who will aid the patient in the event of a mental health crisis (suicidal ideations/suicide attempt).  With written consent from the patient, the family member/significant other has been provided the following suicide prevention education, prior to the and/or following the discharge of the patient.  The suicide prevention education provided includes the following:  Suicide risk factors  Suicide prevention and interventions  National Suicide Hotline telephone number  [facility] assessment telephone number  [facility] Emergency Assistance [place] and/or Residential Mobile Crisis Unit telephone number  Request made of family/significant other to:  Remove weapons (e.g., guns, rifles, knives), all items previously/currently identified as safety concern.    Remove drugs/medications (over-the-counter, prescriptions, illicit drugs), all items previously/currently identified as a safety concern.  The family member/significant other verbalizes understanding of the suicide prevention education information provided.  The family member/significant other agrees to remove the items of safety concern listed above.  Kimber Relic Smart LCSW 12/06/2016, 3:44 PM

## 2016-12-07 MED ORDER — QUETIAPINE FUMARATE 100 MG PO TABS: 100.0000 mg | ORAL_TABLET | Freq: Every day | ORAL | 0 refills | Status: AC

## 2016-12-07 MED ORDER — TRAZODONE HCL 50 MG PO TABS: 50.0000 mg | ORAL_TABLET | Freq: Every evening | ORAL | 0 refills | Status: DC | PRN

## 2016-12-07 MED ORDER — GABAPENTIN 100 MG PO CAPS: 100.0000 mg | ORAL_CAPSULE | Freq: Two times a day (BID) | ORAL | 0 refills | Status: DC

## 2016-12-07 MED ORDER — HYDROXYZINE HCL 25 MG PO TABS: 25.0000 mg | ORAL_TABLET | Freq: Four times a day (QID) | ORAL | 0 refills | Status: DC | PRN
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
## 2016-12-07 NOTE — Progress Notes (Signed)
Adult Psychoeducational Group Note  Date:  12/07/2016 Time:  10:52 AM  Group Topic/Focus:  Healthy Communication:   The focus of this group is to discuss communication, barriers to communication, as well as healthy ways to communicate with others.  Participation Level:  Active  Participation Quality:  Appropriate  Affect:  Appropriate  Cognitive:  Alert  Insight: Good  Engagement in Group:  Engaged  Modes of Intervention:  Activity  Additional Comments:  Pt did participate in all group activities today.  Jihan Rudy R Morrigan Wickens 12/07/2016, 10:52 AM

## 2016-12-07 NOTE — Progress Notes (Signed)
  [facility] Adult Case Management Discharge Plan :  Will you be returning to the same living situation after discharge:  No. Pt is discharging to a residential treatment facility. At discharge, do you have transportation home?: Yes,  [facility] staff will transport. Do you have the ability to pay for your medications: Yes,  pt has insurance.  Release of information consent forms completed and in the chart;  Patient's signature needed at discharge.  Patient to Follow up at: Follow-up [facility] Follow up on 12/07/2016.   Why:  You have been accepted for admission on Friday. Driver from facility will pick you up at approximately 1PM. Please make sure to have 30 day prescription with you (provided by the hospital). Thank you.  Contact information: [address] [place], [place] [address] Phone: [phone] ([facility]-admissions) Fax: [phone]          Next level of care provider has access to [facility] and Suicide Prevention discussed: Yes,  with pt and with pt's girlfriend.  Have you used any form of tobacco in the last 30 days? (Cigarettes, Smokeless Tobacco, Cigars, and/or Pipes): Yes  Has patient been referred to the Quitline?: Patient refused referral  Patient has been referred for addiction treatment: Yes  Georga Kaufmann, MSW, LCSWA 12/07/2016, 9:37 AM

## 2016-12-07 NOTE — Discharge Summary (Signed)
Physician Discharge Summary Note  Patient:  [name] is an 36 y.o., male MRN:  [id number] DOB:  03/09/1981 Patient phone:  [phone] (home)  Patient address:   [address] [place] [id number],  Total Time spent with patient: 30 minutes  Date of Admission:  12/04/2016 Date of Discharge: 12/07/16  Reason for Admission:  Worsening depression with SI  Principal Problem: Major depressive disorder, recurrent, severe without psychotic features [facility]) Discharge Diagnoses: Patient Active Problem List   Diagnosis Date Noted  . Amphetamine use disorder, severe ([place]) [F15.20] 07/10/2016  . Polysubstance (including opioids) dependence with physiological dependence ([place]) [F19.20] 07/09/2016  . Encounter for well adult exam with abnormal findings [L27.51] 12/20/2015  . Bruises easily [R23.8] 12/20/2015  . Squamous cell carcinoma [C44.92] 05/19/2015  . Itching [L29.9] 05/19/2015  . Major depressive disorder, recurrent, severe without psychotic features ([place]) [F33.2]   . Anxiety disorder [F41.9] 12/05/2014  . 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 Anemia   . Anxiety   . Arthritis   . Depression   . Hepatitis C   . History of kidney stones   . Kidney stone   . Opiate dependence ([place])     Past Surgical History:  Procedure Laterality Date  . APPENDECTOMY    . left axillary node biopsy    . RADICAL NECK DISSECTION Left   . TONSILLECTOMY     Family History:  Family History  Problem Relation Age of Onset  . Kidney cancer Father        renal cell carcenoma  . Hodgkin's lymphoma Mother   . Colon cancer Paternal Grandfather        great  . Lung cancer Paternal Grandmother   . Diabetes Maternal Grandmother   . Pancreatic cancer Maternal Grandmother   . Melanoma Maternal Grandfather    Family Psychiatric  History:  Social History:  History  Alcohol Use  . Yes    Comment:  "rarely"     History  Drug Use  . Frequency: 7.0 times per week  . Types: Marijuana, Methamphetamines    Comment: stopped marijuana in Aug 2017    Social History   Social History  . Marital status: Single    Spouse name: N/A  . Number of children: 1  . Years of education: 46   Occupational History  . Mechanic     Social History Main Topics  . Smoking status: Current Some Day Smoker    Packs/day: 0.50    Years: 9.00    Types: Cigarettes  . Smokeless tobacco: Former Systems developer    Types: Chew    Quit date: 04/16/2008  . Alcohol use Yes     Comment: "rarely"  . Drug use: Yes    Frequency: 7.0 times per week    Types: Marijuana, Methamphetamines     Comment: stopped marijuana in Aug 2017  . Sexual activity: Not Currently   Other Topics Concern  . None   Social History Narrative   Fun: Play guitar and hang out with his daughter    Hospital Course:  Per tele assessment [name] an 36 y.o.malewho presents to the ED voluntarily. Pt reports increased depression and suicidal thoughts onset 3 days ago. Pt tearful throughout the assessment and states that he had thoughts of jumping out of his car while he was driving in order to commit suicide. Pt states he has attempted suicide as  a teenager but denies any recent attempts. Pt reports he is prescribed psych meds but states he stopped taking them about 3 days ago when his symptoms began. Pt states he is dealing with relationship issues with his child's mother and "just feelings exhausted and discouraged about everything." Pt reports that he felt "freaked out" when he thought about suicide, so he came to the hospital. Pt reports possible substance induced psychosis and reported to this writer that he thought his friend was riding with him in his car because he saw him in the seat and was talking to him but states that no one was in the car. Pt reports he has been up for several days and recently relapsed on drugs. Pt reports he  has been sober many times in the past and the longest period of sobriety was 4.5 years. Pt unable to contract for safety and states he feels he is ready to receive help. Pt denies HI and denies a hx of aggression to others. On initial [facility] evaluation 12/04/16: [name] is awake, alert and oriented. Patient was evaluated by MD and NP. Patient continues to reports suicidal ideation due to a recent relapse. patient is able to contract for safety. Denies auditory or visual hallucination and does not appear to be responding to internal stimuli.Patient reports he hasn't been  medication compliant. Reports he was previous prescribed Vybird, Ativan and trazodone.  States his depression 10/10. Patient states "I feeling not myself today" Reports poor appetite and is not resting well.  Patient validates the information that was provided in the HPI. Support, encouragement and reassurance was provided.  Patient remained depressed and was started on Seroquel 100 mg PO QHS and Neurontin 100 mg PO BID for mood control. He reported that he could live with his "baby's momma" if he went through rehab. He was accepted at a facility in [place] and the facility arranged to pick him up around 1 pm. Patient was provided with prescriptions and he agreed to continue rehab and will go to follow up appointments after rehab. He agreed to be compliant with medications and treatments. He denied any SI/HI/AVH and contracted for safety.  Physical Findings: AIMS: Facial and Oral Movements Muscles of Facial Expression: None, normal Lips and Perioral Area: None, normal Jaw: None, normal Tongue: None, normal,Extremity Movements Upper (arms, wrists, hands, fingers): None, normal Lower (legs, knees, ankles, toes): None, normal, Trunk Movements Neck, shoulders, hips: None, normal, Overall Severity Severity of abnormal movements (highest score from questions above): None, normal Incapacitation due to abnormal movements: None,  normal Patient's awareness of abnormal movements (rate only patient's report): No Awareness, Dental Status Current problems with teeth and/or dentures?: No Does patient usually wear dentures?: No  CIWA:  CIWA-Ar Total: 3 COWS:  COWS Total Score: 2  Musculoskeletal: Strength & Muscle Tone: within normal limits Gait & Station: normal Patient leans: N/A  Psychiatric Specialty Exam: Physical Exam  Nursing note and vitals reviewed. Constitutional: He is oriented to person, place, and time. He appears well-developed and well-nourished.  Cardiovascular: Normal rate.   Respiratory: Effort normal.  Musculoskeletal: Normal range of motion.  Neurological: He is alert and oriented to person, place, and time.  Skin: Skin is warm.    Review of Systems  Constitutional: Negative.   HENT: Negative.   Eyes: Negative.   Respiratory: Negative.   Cardiovascular: Negative.   Gastrointestinal: Negative.   Genitourinary: Negative.   Musculoskeletal: Negative.   Skin: Negative.   Neurological: Negative.   Endo/Heme/Allergies:  Negative.     Blood pressure 115/84, pulse 78, temperature 99.1 F (37.3 C), temperature source Oral, resp. rate 20, height 5\' 7"  (1.702 m), weight 75.3 kg (166 lb), SpO2 99 %.Body mass index is 26 kg/m.  General Appearance: Casual  Eye Contact:  Good  Speech:  Clear and Coherent and Normal Rate  Volume:  Normal  Mood:  Euthymic  Affect:  Appropriate  Thought Process:  Coherent and Descriptions of Associations: Intact  Orientation:  Full (Time, Place, and Person)  Thought Content:  Logical  Suicidal Thoughts:  No  Homicidal Thoughts:  No  Memory:  Immediate;   Good Recent;   Good  Judgement:  Good  Insight:  Good  Psychomotor Activity:  Normal  Concentration:  Concentration: Good  Recall:  Good  Fund of Knowledge:  Good  Language:  Good  Akathisia:  No  Handed:  Right  AIMS (if indicated):     Assets:  Communication Skills Desire for  Improvement Housing Social Support Transportation  ADL's:  Intact  Cognition:  WNL  Sleep:  Number of Hours: 4.25     Have you used any form of tobacco in the last 30 days? (Cigarettes, Smokeless Tobacco, Cigars, and/or Pipes): Yes  Has this patient used any form of tobacco in the last 30 days? (Cigarettes, Smokeless Tobacco, Cigars, and/or Pipes) Yes, Yes, A prescription for an FDA-approved tobacco cessation medication was offered at discharge and the patient refused  Blood Alcohol level:  Lab Results  Component Value Date   [facility] <5 12/03/2016   ETH <5 88/50/2774    Metabolic Disorder Labs:  Lab Results  Component Value Date   HGBA1C 5.5 12/05/2016   MPG 111.15 12/05/2016   Lab Results  Component Value Date   PROLACTIN 12.0 12/05/2016   Lab Results  Component Value Date   CHOL 104 12/05/2016   TRIG 86 12/05/2016   HDL 31 (L) 12/05/2016   CHOLHDL 3.4 12/05/2016   VLDL 17 12/05/2016   LDLCALC 56 12/05/2016   LDLCALC 57 01/10/2016    See Psychiatric Specialty Exam and Suicide Risk Assessment completed by Attending Physician prior to discharge.  Discharge destination:  Home  Is patient on multiple antipsychotic therapies at discharge:  No   Has Patient had three or more failed trials of antipsychotic monotherapy by history:  No  Recommended Plan for Multiple Antipsychotic Therapies: NA   Allergies as of 12/07/2016      Reactions   Ceclor [cefaclor] Other (See Comments)   Does not recall, was a small child       Medication List    STOP taking these medications   cloNIDine 0.1 MG tablet Commonly known as:  CATAPRES   nicotine 21 mg/24hr patch Commonly known as:  NICODERM CQ - dosed in mg/24 hours   risperiDONE 0.5 MG tablet Commonly known as:  RISPERDAL   risperiDONE 1 MG tablet Commonly known as:  RISPERDAL   sulfamethoxazole-trimethoprim 800-160 MG tablet Commonly known as:  BACTRIM DS,SEPTRA DS   Vilazodone HCl 40 MG Tabs Commonly known as:   VIIBRYD     TAKE these medications     Indication  gabapentin 100 MG capsule Commonly known as:  NEURONTIN Take 1 capsule (100 mg total) by mouth 2 (two) times daily. What changed:  medication strength  how much to take  when to take this  Indication:  mood stability   hydrOXYzine 25 MG tablet Commonly known as:  ATARAX/VISTARIL Take 1 tablet (25 mg total) by  mouth every 6 (six) hours as needed for anxiety.  Indication:  Feeling Anxious   QUEtiapine 100 MG tablet Commonly known as:  SEROQUEL Take 1 tablet (100 mg total) by mouth at bedtime.  Indication:  mood stability   traZODone 50 MG tablet Commonly known as:  DESYREL Take 1 tablet (50 mg total) by mouth at bedtime as needed for sleep. What changed:  medication strength  how much to take  Indication:  [facility] Follow up on 12/07/2016.   Why:  You have been accepted for admission on Friday. Driver from facility will pick you up at approximately 1PM. Please make sure to have 30 day prescription with you (provided by the hospital). Thank you.  Contact information: [address] [place], [place] [address] Phone: [phone] ([facility]-admissions) Fax: [phone]          Follow-up recommendations:  Continue activity as tolerated. Continue diet as recommended by your PCP. Ensure to keep all appointments with outpatient providers.  Comments:  Patient is instructed prior to discharge to: Take all medications as prescribed by his/her mental healthcare provider. Report any adverse effects and or reactions from the medicines to his/her outpatient provider promptly. Patient has been instructed & cautioned: To not engage in alcohol and or illegal drug use while on prescription medicines. In the event of worsening symptoms, patient is instructed to call the crisis hotline, 911 and or go to the nearest ED for appropriate evaluation and treatment of  symptoms. To follow-up with his/her primary care provider for your other medical issues, concerns and or health care needs.    Signed: Lowry Ram Money, FNP 12/07/2016, 7:10 AM

## 2016-12-07 NOTE — Progress Notes (Signed)
Pt completed his daily assessment this morning an on it he wrote  He deneid SI today and he rated his depression, hopelessness and anxeity " 4/2/1", respectively. EW ( dc nurse) completed dc teaching and gave pt cc of all dc paperwork ( AVS, SRA, SSP and transition record). ALL belongigns returned to pt and he is accompanied to exit.

## 2017-03-15 ENCOUNTER — Encounter (HOSPITAL_COMMUNITY): Payer: Self-pay | Admitting: Emergency Medicine

## 2017-03-15 ENCOUNTER — Emergency Department (HOSPITAL_COMMUNITY)
Admission: EM | Admit: 2017-03-15 | Discharge: 2017-03-15 | Disposition: A | Discharge status: Home / Self Care | Payer: BLUE CROSS/BLUE SHIELD | Attending: Emergency Medicine | Admitting: Emergency Medicine

## 2017-03-15 DIAGNOSIS — Z85828 Personal history of other malignant neoplasm of skin: Secondary | ICD-10-CM | POA: Insufficient documentation

## 2017-03-15 DIAGNOSIS — T401X1A Poisoning by heroin, accidental (unintentional), initial encounter: Secondary | ICD-10-CM | POA: Diagnosis present

## 2017-03-15 DIAGNOSIS — F17210 Nicotine dependence, cigarettes, uncomplicated: Secondary | ICD-10-CM | POA: Insufficient documentation

## 2017-03-15 DIAGNOSIS — Z79899 Other long term (current) drug therapy: Secondary | ICD-10-CM | POA: Insufficient documentation

## 2017-03-15 MED ORDER — NALOXONE HCL 4 MG/0.1ML NA LIQD
1.0000 | Freq: Once | NASAL | Status: AC
  Administered 2017-03-15: 1 via NASAL
  Filled 2017-03-15: qty 4

## 2017-03-15 MED ORDER — ONDANSETRON HCL 4 MG/2ML IJ SOLN
4.0000 mg | Freq: Once | INTRAMUSCULAR | Status: AC
  Administered 2017-03-15: 4 mg via INTRAVENOUS
  Filled 2017-03-15: qty 2

## 2017-03-15 NOTE — ED Notes (Signed)
Pt verbalizes understanding of d/c instructions. Pt received Narcan for home use upon d/c. Pt ambulatory at d/c with all belongings.

## 2017-03-15 NOTE — ED Provider Notes (Signed)
[facility] EMERGENCY DEPARTMENT Provider Note   CSN: [id number] Arrival date & time: 03/15/17  1756     History   Chief Complaint Chief Complaint  Patient presents with  . Drug Overdose    HPI [name] is a 36 y.o. male.  HPI 36 year old male with a history of opiate dependence and meth abuse presenting after an accidental heroin overdose.  He states that he relapsed several weeks ago and has been using meth heavily IV and has been using IV heroin to "bring him back down".  He had done meth earlier in the day and his friend was driving him to rehab and they stopped at a grocery store and the patient went into the bathroom and accidentally overdosed on heroin.  He was found unresponsive but breathing.  EMS gave 1.5 mg of Narcan and the patient slowly became more alert and oriented.  He denies SI.  He states that he was trying to come down from meth and the overdose was accidental.  He states he has not overdosed in a long time.  Plans to go to rehab at the next available time.  Denies any headache, chest pain, abdominal pain, vomiting, diarrhea.  Past Medical History:  Diagnosis Date  . Anemia   . Anxiety   . Arthritis   . Depression   . Hepatitis C   . History of kidney stones   . Kidney stone   . Opiate dependence [facility])     Patient Active Problem List   Diagnosis Date Noted  . Amphetamine use disorder, severe ([place]) 07/10/2016  . Polysubstance (including opioids) dependence with physiological dependence ([place]) 07/09/2016  . Encounter for well adult exam with abnormal findings 12/20/2015  . Bruises easily 12/20/2015  . Squamous cell carcinoma 05/19/2015  . Itching 05/19/2015  . Major depressive disorder, recurrent, severe without psychotic features ([place])   . Anxiety disorder 12/05/2014  . 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 APPENDECTOMY    . left axillary node biopsy    . RADICAL NECK  DISSECTION Left   . TONSILLECTOMY         Home Medications    Prior to Admission medications   Medication Sig Start Date End Date Taking? Authorizing Provider  gabapentin (NEURONTIN) 100 MG capsule Take 1 capsule (100 mg total) by mouth 2 (two) times daily. 12/07/16   Money, Lowry Ram, FNP  hydrOXYzine (ATARAX/VISTARIL) 25 MG tablet Take 1 tablet (25 mg total) by mouth every 6 (six) hours as needed for anxiety. 12/07/16   Money, Lowry Ram, FNP  QUEtiapine (SEROQUEL) 100 MG tablet Take 1 tablet (100 mg total) by mouth at bedtime. 12/07/16   Money, Lowry Ram, FNP  traZODone (DESYREL) 50 MG tablet Take 1 tablet (50 mg total) by mouth at bedtime as needed for sleep. 12/07/16   Money, Lowry Ram, FNP    Family History Family History  Problem Relation Age of Onset  . Kidney cancer Father        renal cell carcenoma  . Hodgkin's lymphoma Mother   . Colon cancer Paternal Grandfather        great  . Lung cancer Paternal Grandmother   . Diabetes Maternal Grandmother   . Pancreatic cancer Maternal Grandmother   . Melanoma Maternal Grandfather     Social History Social History   Tobacco Use  . Smoking status: Current Some Day Smoker    Packs/day: 0.50    Years:  9.00    Pack years: 4.50    Types: Cigarettes  . Smokeless tobacco: Former Systems developer    Types: Chew    Quit date: 04/16/2008  Substance Use Topics  . Alcohol use: Yes    Comment: "rarely"  . Drug use: Yes    Frequency: 7.0 times per week    Types: Marijuana, Methamphetamines, IV    Comment: meth and heroin use today     Allergies   Ceclor [cefaclor]   Review of Systems Review of Systems  Constitutional: Negative for chills and fever.  HENT: Negative for ear pain and sore throat.   Eyes: Negative for pain and visual disturbance.  Respiratory: Negative for cough and shortness of breath.   Cardiovascular: Negative for chest pain and palpitations.  Gastrointestinal: Negative for abdominal pain and vomiting.  Genitourinary:  Negative for dysuria and hematuria.  Musculoskeletal: Negative for arthralgias and back pain.  Skin: Negative for color change and rash.  Neurological: Negative for seizures and syncope.  All other systems reviewed and are negative.    Physical Exam Updated Vital Signs BP 109/69   Pulse 87   Temp (!) 97.5 F (36.4 C) (Oral)   Resp 16   SpO2 96%   Physical Exam  Constitutional: He appears well-developed and well-nourished.  HENT:  Head: Normocephalic and atraumatic.  Eyes: Conjunctivae are normal.  Neck: Neck supple.  Cardiovascular: Normal rate, regular rhythm, normal heart sounds, intact distal pulses and normal pulses.  No murmur heard. Pulmonary/Chest: Effort normal and breath sounds normal. No respiratory distress.  Abdominal: Soft. There is no tenderness.  Musculoskeletal: He exhibits no edema.  Neurological: He is alert.  Skin: Skin is warm and dry. Capillary refill takes less than 2 seconds.  Multiple track marks in b/l UE.  Psychiatric: He has a normal mood and affect.  Nursing note and vitals reviewed.    ED Treatments / Results  Labs (all labs ordered are listed, but only abnormal results are displayed) Labs Reviewed - No data to display  EKG  EKG Interpretation  Date/Time:  Friday March 15 2017 18:09:30 EST Ventricular Rate:  92 PR Interval:    QRS Duration: 97 QT Interval:  409 QTC Calculation: 506 R Axis:   107 Text Interpretation:  Sinus rhythm Right axis deviation Prolonged QT interval No STEMI.  Confirmed by Nanda Quinton [id number]) on 03/15/2017 6:18:43 PM       Radiology No results found.  Procedures Procedures (including critical care time)  Medications Ordered in ED Medications  ondansetron (ZOFRAN) injection 4 mg (4 mg Intravenous Given 03/15/17 2029)  naloxone (NARCAN) nasal spray 4 mg/0.1 mL (1 spray Nasal Provided for home use 03/15/17 2119)     Initial Impression / Assessment and Plan / ED Course  I have reviewed the triage  vital signs and the nursing notes.  Pertinent labs & imaging results that were available during my care of the patient were reviewed by me and considered in my medical decision making (see chart for details).     36 year old male presenting after an accidental heroin overdose.  Denies suicidal ideation and states this was accidental.  On arrival he is alert and oriented x3.  ABCs intact.  Equal breath sounds bilaterally.  Acting appropriately.  Is afebrile and hemodynamically stable.  No clinical signs of infection.  EKG showed a sinus rhythm with no ischemic change.  Patient will be monitored to ensure that he does not become altered after the Narcan wears off.  Patient was  observed for approximately 3 hours and has remained appropriately alert and oriented and hemodynamically stable. Pt given lazarus kit. States he plans to go to rehab tomorrow. Return precautions given.  Final Clinical Impressions(s) / ED Diagnoses   Final diagnoses:  Accidental overdose of heroin, initial encounter [facility])    ED Discharge Orders    None       Domingos Riggi [place], MD 03/15/17 2255    Margette Fast, MD 03/16/17 [phone]

## 2017-03-15 NOTE — ED Triage Notes (Signed)
Per [facility]: Pt to ED following overdose of heroin and meth. Pt was being driven by a friend to rehab center - pt is IV drug user. Pt stated he needed to use the restroom so they stopped at a gas station and as patient came out of the bathroom, he passed out in the front seat of the car - friend stated he couldn't find a pulse and patient was not breathing. Pt's friend started CPR and upon EMS arrival, pt had pulse but still apneic. EMS assisted with respirations and started 20g. LFA, gave 0.5mg  Narcan and patient immediately woke up breathing spontaneously. Patient A&O x 3, apologetic, c/o feeling cold. Admits to shooting amphetamines and heroin into R hand. EMS VS: 102/72, HR 98 NSR, CBG 186, 97% RA.

## 2017-03-15 NOTE — ED Notes (Signed)
MD at bedside. 

## 2017-03-15 NOTE — ED Notes (Signed)
Pt ambulated independently with a steady gait. 

## 2017-03-17 ENCOUNTER — Emergency Department (HOSPITAL_COMMUNITY): Payer: BLUE CROSS/BLUE SHIELD

## 2017-03-17 ENCOUNTER — Emergency Department (HOSPITAL_COMMUNITY)
Admission: EM | Admit: 2017-03-17 | Discharge: 2017-03-18 | Disposition: A | Discharge status: Home / Self Care | Payer: BLUE CROSS/BLUE SHIELD | Attending: Emergency Medicine | Admitting: Emergency Medicine

## 2017-03-17 ENCOUNTER — Encounter (HOSPITAL_COMMUNITY): Payer: Self-pay | Admitting: Emergency Medicine

## 2017-03-17 DIAGNOSIS — R45851 Suicidal ideations: Secondary | ICD-10-CM | POA: Diagnosis not present

## 2017-03-17 DIAGNOSIS — R05 Cough: Secondary | ICD-10-CM | POA: Diagnosis not present

## 2017-03-17 DIAGNOSIS — F1920 Other psychoactive substance dependence, uncomplicated: Secondary | ICD-10-CM | POA: Diagnosis not present

## 2017-03-17 DIAGNOSIS — F419 Anxiety disorder, unspecified: Secondary | ICD-10-CM | POA: Diagnosis not present

## 2017-03-17 DIAGNOSIS — F329 Major depressive disorder, single episode, unspecified: Secondary | ICD-10-CM | POA: Diagnosis not present

## 2017-03-17 DIAGNOSIS — F1520 Other stimulant dependence, uncomplicated: Secondary | ICD-10-CM | POA: Diagnosis present

## 2017-03-17 DIAGNOSIS — Z79899 Other long term (current) drug therapy: Secondary | ICD-10-CM | POA: Insufficient documentation

## 2017-03-17 DIAGNOSIS — F17210 Nicotine dependence, cigarettes, uncomplicated: Secondary | ICD-10-CM | POA: Insufficient documentation

## 2017-03-17 DIAGNOSIS — Z85828 Personal history of other malignant neoplasm of skin: Secondary | ICD-10-CM | POA: Diagnosis not present

## 2017-03-17 DIAGNOSIS — F1910 Other psychoactive substance abuse, uncomplicated: Secondary | ICD-10-CM | POA: Diagnosis present

## 2017-03-17 DIAGNOSIS — F1414 Cocaine abuse with cocaine-induced mood disorder: Secondary | ICD-10-CM

## 2017-03-17 LAB — CBC WITH DIFFERENTIAL/PLATELET
Basophils Absolute: 0 10*3/uL (ref 0.0–0.1)
Basophils Relative: 0 %
EOS PCT: 1 %
Eosinophils Absolute: 0.1 10*3/uL (ref 0.0–0.7)
HEMATOCRIT: 43.3 % (ref 39.0–52.0)
Hemoglobin: 15.5 g/dL (ref 13.0–17.0)
LYMPHS ABS: 1.5 10*3/uL (ref 0.7–4.0)
LYMPHS PCT: 17 %
MCH: 30.8 pg (ref 26.0–34.0)
MCHC: 35.8 g/dL (ref 30.0–36.0)
MCV: 85.9 fL (ref 78.0–100.0)
MONO ABS: 0.6 10*3/uL (ref 0.1–1.0)
Monocytes Relative: 7 %
Neutro Abs: 6.4 10*3/uL (ref 1.7–7.7)
Neutrophils Relative %: 75 %
PLATELETS: 386 10*3/uL (ref 150–400)
RBC: 5.04 MIL/uL (ref 4.22–5.81)
RDW: 13.1 % (ref 11.5–15.5)
WBC: 8.6 10*3/uL (ref 4.0–10.5)

## 2017-03-17 LAB — RAPID URINE DRUG SCREEN, HOSP PERFORMED
AMPHETAMINES: POSITIVE — AB
BENZODIAZEPINES: NOT DETECTED
Barbiturates: NOT DETECTED
COCAINE: POSITIVE — AB
Opiates: NOT DETECTED
Tetrahydrocannabinol: NOT DETECTED

## 2017-03-17 LAB — COMPREHENSIVE METABOLIC PANEL
ALBUMIN: 4.4 g/dL (ref 3.5–5.0)
ALT: 54 U/L (ref 17–63)
ANION GAP: 10 (ref 5–15)
AST: 72 U/L — AB (ref 15–41)
Alkaline Phosphatase: 51 U/L (ref 38–126)
BILIRUBIN TOTAL: 1.1 mg/dL (ref 0.3–1.2)
BUN: 16 mg/dL (ref 6–20)
CO2: 26 mmol/L (ref 22–32)
Calcium: 9.4 mg/dL (ref 8.9–10.3)
Chloride: 101 mmol/L (ref 101–111)
Creatinine, Ser: 0.86 mg/dL (ref 0.61–1.24)
GFR calc Af Amer: >60 mL/min (ref >60)
GLUCOSE: 90 mg/dL (ref 65–99)
POTASSIUM: 3.5 mmol/L (ref 3.5–5.1)
Sodium: 137 mmol/L (ref 135–145)
TOTAL PROTEIN: 8 g/dL (ref 6.5–8.1)

## 2017-03-17 LAB — ETHANOL

## 2017-03-17 MED ORDER — CLONIDINE HCL 0.1 MG PO TABS: 0.1000 mg | ORAL_TABLET | ORAL | Status: DC

## 2017-03-17 MED ORDER — GABAPENTIN 300 MG PO CAPS
300.0000 mg | ORAL_CAPSULE | Freq: Three times a day (TID) | ORAL | Status: DC
  Administered 2017-03-17 (×3): 300 mg via ORAL
  Filled 2017-03-17 (×3): qty 1

## 2017-03-17 MED ORDER — METHOCARBAMOL 500 MG PO TABS: 500.0000 mg | ORAL_TABLET | Freq: Three times a day (TID) | ORAL | Status: DC | PRN

## 2017-03-17 MED ORDER — CLONIDINE HCL 0.1 MG PO TABS
0.1000 mg | ORAL_TABLET | Freq: Four times a day (QID) | ORAL | Status: DC
  Administered 2017-03-17 (×3): 0.1 mg via ORAL
  Filled 2017-03-17 (×3): qty 1

## 2017-03-17 MED ORDER — HYDROXYZINE HCL 25 MG PO TABS: 25.0000 mg | ORAL_TABLET | Freq: Four times a day (QID) | ORAL | Status: DC | PRN

## 2017-03-17 MED ORDER — NAPROXEN 500 MG PO TABS: 500.0000 mg | ORAL_TABLET | Freq: Two times a day (BID) | ORAL | Status: DC | PRN

## 2017-03-17 MED ORDER — DICYCLOMINE HCL 20 MG PO TABS: 20.0000 mg | ORAL_TABLET | Freq: Four times a day (QID) | ORAL | Status: DC | PRN

## 2017-03-17 MED ORDER — CLONIDINE HCL 0.1 MG PO TABS: 0.1000 mg | ORAL_TABLET | Freq: Every day | ORAL | Status: DC

## 2017-03-17 MED ORDER — ONDANSETRON 4 MG PO TBDP: 4.0000 mg | ORAL_TABLET | Freq: Four times a day (QID) | ORAL | Status: DC | PRN

## 2017-03-17 MED ORDER — LOPERAMIDE HCL 2 MG PO CAPS: 2.0000 mg | ORAL_CAPSULE | ORAL | Status: DC | PRN

## 2017-03-17 NOTE — ED Notes (Signed)
Bed: [facility] Expected date:  Expected time:  Means of arrival:  Comments: 

## 2017-03-17 NOTE — ED Provider Notes (Signed)
[facility] DEPT Provider Note   CSN: [id number] Arrival date & time: 03/17/17  2376     History   Chief Complaint Chief Complaint  Patient presents with  . Suicidal    HPI Llewyn Heap is a 36 y.o. male.   HPI  36 year old male with a history of depression, anxiety, hepatitis C, polysubstance abuse and opiate dependence with overdose yesterday on the way to rehab requiring narcan and ED visit presents with concern for worsening anxiety, depression, suicidal ideations.  Reports he cannot stop doing drugs, and because of this he is feeling increasingly depressed and anxious.  Reports he did have a plan to go do something to get him in trouble and be shot by the police.  Reports he is been using heroin, methamphetamines and cocaine.  Denies homicidal ideations or hallucinations.  Reports has been off of his medications. Reports he has been injecting meth and heroin. Denies fevers. Reports some cough.    Past Medical History:  Diagnosis Date  . Anemia   . Anxiety   . Arthritis   . Depression   . Hepatitis C   . History of kidney stones   . Kidney stone   . Opiate dependence [facility])     Patient Active Problem List   Diagnosis Date Noted  . 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 Polysubstance (including opioids) dependence with physiological dependence ([place]) 07/09/2016  . Encounter for well adult exam with abnormal findings 12/20/2015  . Bruises easily 12/20/2015  . Squamous cell carcinoma 05/19/2015  . Itching 05/19/2015  . Major depressive disorder, recurrent, severe without psychotic features ([place])   . Anxiety disorder 12/05/2014  . 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 APPENDECTOMY    . left axillary node biopsy    . RADICAL NECK DISSECTION Left   . TONSILLECTOMY         Home Medications    Prior to Admission medications   Medication Sig Start Date End Date  Taking? Authorizing Provider  gabapentin (NEURONTIN) 100 MG capsule Take 1 capsule (100 mg total) by mouth 2 (two) times daily. 12/07/16  Yes Money, Lowry Ram, FNP  QUEtiapine (SEROQUEL) 100 MG tablet Take 1 tablet (100 mg total) by mouth at bedtime. 12/07/16  Yes Money, Lowry Ram, FNP  hydrOXYzine (ATARAX/VISTARIL) 25 MG tablet Take 1 tablet (25 mg total) by mouth every 6 (six) hours as needed for anxiety. 12/07/16   Money, Lowry Ram, FNP  traZODone (DESYREL) 50 MG tablet Take 1 tablet (50 mg total) by mouth at bedtime as needed for sleep. Patient not taking: Reported on 03/17/2017 12/07/16   Money, Lowry Ram, FNP    Family History Family History  Problem Relation Age of Onset  . Kidney cancer Father        renal cell carcenoma  . Hodgkin's lymphoma Mother   . Colon cancer Paternal Grandfather        great  . Lung cancer Paternal Grandmother   . Diabetes Maternal Grandmother   . Pancreatic cancer Maternal Grandmother   . Melanoma Maternal Grandfather     Social History Social History   Tobacco Use  . Smoking status: Current Some Day Smoker    Packs/day: 0.50    Years: 9.00    Pack years: 4.50    Types: Cigarettes  . Smokeless tobacco: Former Systems developer    Types: Chew    Quit date: 04/16/2008  Substance Use Topics  .  Alcohol use: Yes    Comment: "rarely"  . Drug use: Yes    Frequency: 7.0 times per week    Types: Marijuana, Methamphetamines, IV    Comment: meth and heroin use today     Allergies   Ceclor [cefaclor]   Review of Systems Review of Systems  Constitutional: Negative for fever.  HENT: Negative for sore throat.   Eyes: Negative for visual disturbance.  Respiratory: Positive for cough. Negative for shortness of breath.   Cardiovascular: Negative for chest pain.  Gastrointestinal: Negative for abdominal pain, nausea and vomiting.  Genitourinary: Negative for difficulty urinating.  Musculoskeletal: Negative for back pain and neck stiffness.  Skin: Negative for rash.    Neurological: Negative for syncope and headaches.     Physical Exam Updated Vital Signs BP 106/75 (BP Location: Left Arm)   Pulse 92   Temp (!) 97.5 F (36.4 C) (Oral)   Resp 18   Ht 5\' 7"  (1.702 m)   Wt 74.8 kg (165 lb)   SpO2 99%   BMI 25.84 kg/m   Physical Exam  Constitutional: He is oriented to person, place, and time. He appears well-developed and well-nourished. No distress.  HENT:  Head: Normocephalic and atraumatic.  Eyes: Conjunctivae and EOM are normal.  Neck: Normal range of motion.  Cardiovascular: Normal rate, regular rhythm, normal heart sounds and intact distal pulses. Exam reveals no gallop and no friction rub.  No murmur heard. Pulmonary/Chest: Effort normal and breath sounds normal. No respiratory distress. He has no wheezes. He has no rales.  Abdominal: Soft. He exhibits no distension. There is no tenderness. There is no guarding.  Musculoskeletal: He exhibits no edema.  Neurological: He is alert and oriented to person, place, and time.  Skin: Skin is warm and dry. He is not diaphoretic.  Multiple healing scabs over hands, arms   Psychiatric: His mood appears anxious. He is not actively hallucinating. He expresses suicidal ideation. He expresses no homicidal ideation. He expresses suicidal plans. He expresses no homicidal plans.  Nursing note and vitals reviewed.    ED Treatments / Results  Labs (all labs ordered are listed, but only abnormal results are displayed) Labs Reviewed  COMPREHENSIVE METABOLIC PANEL - Abnormal; Notable for the following components:      Result Value   AST 72 (*)    All other components within normal limits  RAPID URINE DRUG SCREEN, HOSP PERFORMED - Abnormal; Notable for the following components:   Cocaine POSITIVE (*)    Amphetamines POSITIVE (*)    All other components within normal limits  ETHANOL  CBC WITH DIFFERENTIAL/PLATELET    EKG  EKG Interpretation None       Radiology Dg Chest 2 View  Result Date:  03/17/2017 CLINICAL DATA:  Cough. EXAM: CHEST  2 VIEW COMPARISON:  06/06/2016 FINDINGS: The lungs are clear without focal pneumonia, edema, pneumothorax or pleural effusion. The cardiopericardial silhouette is within normal limits for size. The visualized bony structures of the thorax are intact. Telemetry leads overlie the chest. IMPRESSION: No active cardiopulmonary disease. Electronically Signed   By: Misty Stanley M.D.   On: 03/17/2017 10:02    Procedures Procedures (including critical care time)  Medications Ordered in ED Medications  gabapentin (NEURONTIN) capsule 300 mg (300 mg Oral Given 03/17/17 1228)  dicyclomine (BENTYL) tablet 20 mg (not administered)  hydrOXYzine (ATARAX/VISTARIL) tablet 25 mg (not administered)  loperamide (IMODIUM) capsule 2-4 mg (not administered)  methocarbamol (ROBAXIN) tablet 500 mg (not administered)  naproxen (NAPROSYN)  tablet 500 mg (not administered)  ondansetron (ZOFRAN-ODT) disintegrating tablet 4 mg (not administered)  cloNIDine (CATAPRES) tablet 0.1 mg (0 mg Oral Hold 03/17/17 1424)    Followed by  cloNIDine (CATAPRES) tablet 0.1 mg (not administered)    Followed by  cloNIDine (CATAPRES) tablet 0.1 mg (not administered)     Initial Impression / Assessment and Plan / ED Course  I have reviewed the triage vital signs and the nursing notes.  Pertinent labs & imaging results that were available during my care of the patient were reviewed by me and considered in my medical decision making (see chart for details).     36 year old male with a history of depression, anxiety, hepatitis C, polysubstance abuse and opiate dependence with overdose yesterday on the way to rehab requiring narcan and ED visit presents with concern for worsening anxiety, depression, suicidal ideation with plan to get in trouble and be shot by the police.  Chest x-ray shows no sign of pneumonia.  Labs are within normal limits.  Patient is medically cleared.   TTS evaluated.  Plan to reeval in AM. Home meds have been ordered.  Final Clinical Impressions(s) / ED Diagnoses   Final diagnoses:  Suicidal thoughts  Polysubstance abuse [facility])    ED Discharge Orders    None       Gareth Morgan, MD 03/17/17 [phone]

## 2017-03-17 NOTE — ED Notes (Signed)
Report to include situation, background, assessment and recommendations from Edie RN. Patient sleeping, respirations regular and unlabored. Q15 minute rounds and security camera observation to continue.   

## 2017-03-17 NOTE — ED Triage Notes (Signed)
Pt reports he does not feel well emotionally and has hx of harming himself when he gets like this. Endorses SI with plan to have cops shoot him or jump in front of a vehicle. No HI. Uses heroin and meth. Last use of heroin 2 days ago.

## 2017-03-17 NOTE — BH Assessment (Addendum)
Assessment Note  [name] is an 36 y.o. male who presented in the ED stating that he was suicidal with a plan / suicide by law enforcement.  Patient states that he is a substance user and he is abusing heroin and meth-amphetamine.  He states that he  was seen in the ED two days ago after overdosing on heroin on his way to a treatment center.  Today, he is presenting saying that his depression is the issue and he states that drugs are not his problem.  Patient has a court date on 03/20/17 for B&E.  Patient states that he has not eaten in two days and he states that he has been sleeping outside.  Patient states that he has been using drugs since he was 36 years old and states that he has been in and out of treatment centers and states that he was last in [facility] twice in October.  He states that he has been clean for 4.5 years in the past, but states that most recently that he has only been able to stay clean for 30-60 days at a time.  He states that he was on his way back to a treatment center, [facility], and states that he overdosed en-route. He was brought to the ED and discharged due to it having been an accidental overdose.  Patient denies any current withdrawal symptoms.  Patient states that he has a history of depression with prior treatment with a psychiatrist. He states that he has been taking Seroquel and Vibryd, but states that  he has been off his medications for the past month and using drugs.  Patient states: "I always become suicidal when I am using drugs." Patient states that he has two previous attempts as an adolescent.  He states that  He also has a history of self-mutilation, but states that he has not cut in a couple of months.  Patient is alert and oriented, depressed mood and has increased anxiety.  Patient denies any current HI/Psychosis.  He states that he is not eating or sleeping well, but states that he is living on the  streets.  Patient appears to be possibly seeking treatment for secondary gain due to his legal issues and his homelessness.   Diagnosis: Major Depressive Disorder Recurrent Severe F33.2/ Opioid Use Disorder Severe 304.00 and Amphetamine Use Disorder Severe 304.40  Past Medical History:  Past Medical History:  Diagnosis Date  . Anemia   . Anxiety   . Arthritis   . Depression   . Hepatitis C   . History of kidney stones   . Kidney stone   . Opiate dependence ([place])     Past Surgical History:  Procedure Laterality Date  . APPENDECTOMY    . left axillary node biopsy    . RADICAL NECK DISSECTION Left   . TONSILLECTOMY      Family History:  Family History  Problem Relation Age of Onset  . Kidney cancer Father        renal cell carcenoma  . Hodgkin's lymphoma Mother   . Colon cancer Paternal Grandfather        great  . Lung cancer Paternal Grandmother   . Diabetes Maternal Grandmother   . Pancreatic cancer Maternal Grandmother   . Melanoma Maternal Grandfather     Social History:  reports that he has been smoking cigarettes.  He has a 4.50 pack-year smoking history. He quit smokeless tobacco use about 8 years ago.  His smokeless tobacco use included chew. He reports that he drinks alcohol. He reports that he uses drugs. Drugs: Marijuana, Methamphetamines, and IV. Frequency: 7.00 times per week.  Additional Social History:  Alcohol / Drug Use Pain Medications: denies Prescriptions: denies Over the Counter: denies History of alcohol / drug use?: Yes Substance #1 Name of Substance 1: Heroin 1 - Age of First Use: 17 1 - Amount (size/oz): 1/4 gram 1 - Frequency: daily 1 - Duration: 2 weeks 1 - Last Use / Amount: 1/4 gram 2 dyas ago Substance #2 Name of Substance 2: Meth-amphetamine 2 - Age of First Use: 17 2 - Amount (size/oz): 2-3 grams 2 - Frequency: daily 2 - Duration: 2 weeks 2 - Last Use / Amount: 2-3 grams  CIWA: CIWA-Ar BP: 106/75 Pulse Rate: 92 COWS:     Allergies:  Allergies  Allergen Reactions  . Ceclor [Cefaclor] Other (See Comments)    Does not recall, was a small child     Home Medications:  (Not in a hospital admission)  OB/GYN Status:  No LMP for male patient.  General Assessment Data Location of Assessment: [facility] ED TTS Assessment: In system Is this a Tele or Face-to-Face Assessment?: Face-to-Face Is this an Initial Assessment or a Re-assessment for this encounter?: Initial Assessment Marital status: Single Maiden name: NA Is patient pregnant?: No Pregnancy Status: No Living Arrangements: Other (Comment)(homeless) Can pt return to current living arrangement?: Yes Admission Status: Involuntary Is patient capable of signing voluntary admission?: Yes Referral Source: MD Insurance type: Physicist, medical)     Crisis Care Plan Living Arrangements: Other (Comment)(homeless) Legal Guardian: Other:(none) Name of Psychiatrist: (Dr. Arlyss Queen) Name of Therapist: (none)  Education Status Is patient currently in school?: No Current Grade: (NA) Highest grade of school patient has completed: (GED) Name of school: (not assessed) Contact person: (NA)  Risk to self with the past 6 months Suicidal Ideation: Yes-Currently Present Has patient been a risk to self within the past 6 months prior to admission? : No Suicidal Intent: No-Not Currently/Within Last 6 Months Has patient had any suicidal intent within the past 6 months prior to admission? : No Is patient at risk for suicide?: Yes(homeless, unemployed) Suicidal Plan?: Yes-Currently Present Has patient had any suicidal plan within the past 6 months prior to admission? : No Specify Current Suicidal Plan: (Event organiser) Access to Means: Yes Specify Access to Suicidal Means: (Have police shoot him) What has been your use of drugs/alcohol within the last 12 months?: (daily use) Previous Attempts/Gestures: Yes How many times?: 2 Other Self Harm Risks: (none) Triggers for  Past Attempts: Other (Comment)(drug use) Intentional Self Injurious Behavior: Cutting Comment - Self Injurious Behavior: (none in a few months) Family Suicide History: No Recent stressful life event(s): Job Loss, Museum/gallery curator Problems, Legal Issues, Other (Comment) Persecutory voices/beliefs?: No Depression: Yes Depression Symptoms: Despondent, Isolating, Loss of interest in usual pleasures, Feeling worthless/self pity, Feeling angry/irritable Substance abuse history and/or treatment for substance abuse?: Yes Suicide prevention information given to non-admitted patients: Not applicable  Risk to Others within the past 6 months Homicidal Ideation: No Does patient have any lifetime risk of violence toward others beyond the six months prior to admission? : No Thoughts of Harm to Others: No Current Homicidal Intent: No Current Homicidal Plan: No Access to Homicidal Means: No Identified Victim: (none) History of harm to others?: No Assessment of Violence: On admission Violent Behavior Description: (none) Does patient have access to weapons?: No Criminal Charges Pending?: Yes Describe Pending Criminal  Charges: (B&E) Does patient have a court date: Yes(03/20/17) Court Date: 03/20/17 Is patient on probation?: No  Psychosis Hallucinations: None noted Delusions: None noted  Mental Status Report Appearance/Hygiene: Unremarkable Eye Contact: Good Motor Activity: Restlessness Speech: Unremarkable Level of Consciousness: Alert Mood: Depressed, Anxious Affect: Flat Anxiety Level: Moderate Thought Processes: Coherent Judgement: Impaired Orientation: Person, Place, Time, Situation Obsessive Compulsive Thoughts/Behaviors: Minimal  Cognitive Functioning Concentration: Decreased Memory: Recent Intact IQ: Average Insight: Poor Impulse Control: Poor Appetite: Good Weight Loss: (30 lbs) Sleep: Decreased Total Hours of Sleep: 3 Vegetative Symptoms: None  ADLScreening [facility] Assessment  Services) Patient's cognitive ability adequate to safely complete daily activities?: Yes Patient able to express need for assistance with ADLs?: Yes Independently performs ADLs?: Yes (appropriate for developmental age)  Prior Inpatient Therapy Prior Inpatient Therapy: Yes Prior Therapy Dates: (2 times in October) Prior Therapy Facilty/Provider(s): [facility] for [facility] Reason for Treatment: (drug use)  Prior Outpatient Therapy Prior Outpatient Therapy: No Prior Therapy Dates: NA Prior Therapy Facilty/Provider(s): (none) Reason for Treatment: (heroin and methamphetamine use) Does patient have an ACCT team?: No Does patient have Intensive In-House Services?  : No Does patient have [facility] services? : No Does patient have P4CC services?: No  ADL Screening (condition at time of admission) Patient's cognitive ability adequate to safely complete daily activities?: Yes Is the patient deaf or have difficulty hearing?: No Does the patient have difficulty seeing, even when wearing glasses/contacts?: No Does the patient have difficulty concentrating, remembering, or making decisions?: No Patient able to express need for assistance with ADLs?: Yes Does the patient have difficulty dressing or bathing?: No Independently performs ADLs?: Yes (appropriate for developmental age) Does the patient have difficulty walking or climbing stairs?: No Weakness of Legs: None Weakness of Arms/Hands: None         Values / Beliefs Cultural Requests During Hospitalization: None Spiritual Requests During Hospitalization: None Consults Spiritual Care Consult Needed: No Social Work Consult Needed: No Regulatory affairs officer (For Healthcare) Does Patient Have a Medical Advance Directive?: No Would patient like information on creating a medical advance directive?: No - Patient declined    Additional Information 1:1 In Past 12 Months?: No CIRT Risk: No Elopement Risk: No Does patient have medical  clearance?: Yes     Disposition: Per Waylan Boga, FNP, patient will be monitored and observed overnight for safety and to monitor his withdrawal potential.  He will be seen by the psychaitrist in the AM for possible discharge.  Peer support to see for tx options. Disposition Initial Assessment Completed for this Encounter: Yes Disposition of Patient: Pending Review with psychiatrist  On Site Evaluation by:   Reviewed with Physician:    Judeth Porch Yair Dusza 03/17/2017 11:10 AM

## 2017-03-17 NOTE — ED Notes (Signed)
Hourly rounding reveals patient sleeping in room. No complaints, stable, in no acute distress. Q15 minute rounds and monitoring via Security Cameras to continue. 

## 2017-03-17 NOTE — ED Notes (Signed)
Bed: [facility] Expected date:  Expected time:  Means of arrival:  Comments: TR4

## 2017-03-17 NOTE — ED Notes (Signed)
Pt oriented to room and unit.  Pt appears anxious and jittery.  Pt denies S/I , stating "I just dont feel good"  15 minute checks and video monitoring in place.

## 2017-03-17 NOTE — Patient Outreach (Signed)
ED Peer Support Specialist Patient Intake (Complete at intake & 30-60 Day Follow-up)  Name: [name]  MRN: [id number]  Age: 36 y.o.   Date of Admission: 03/17/2017  Intake: Initial Comments:      Primary Reason Admitted: Pt was being driven by a friend to rehab center - pt is IV drug user. Pt stated he needed to use the restroom so they stopped at a gas station and as patient came out of the bathroom, he passed out in the front seat of the car - friend stated he couldn't find a pulse and patient was not breathing. Pt's friend started CPR and upon EMS arrival, pt had pulse but still apneic. EMS assisted with respirations and started 20g. LFA, gave 0.5mg  Narcan and patient immediately woke up breathing spontaneously. Patient A&O x 3, apologetic, c/o feeling cold. Admits to shooting amphetamines and heroin into R hand. EMS VS: 102/72, HR 98 NSR, CBG 186, 97% RA.     Lab values: Alcohol/ETOH: Negative Positive UDS? Yes Amphetamines: No Barbiturates: No Benzodiazepines: Yes Cocaine: Yes Opiates: Yes Cannabinoids: No  Demographic information: Gender: Male Ethnicity:   Marital Status: Single Insurance Status: Other (comment)([employer] ) Ecologist (Work Neurosurgeon, Physicist, medical, etc.: Yes Lives with: Friend/Rommate Living situation: House/Apartment  Reported Patient History: Patient reported health conditions: Anxiety disorders, Depression Patient aware of HIV and hepatitis status: No  In past year, has patient visited ED for any reason? Yes  Number of ED visits: 2  Reason(s) for visit: same situation   In past year, has patient been hospitalized for any reason? No  Number of hospitalizations:    Reason(s) for hospitalization:    In past year, has patient been arrested? No  Number of arrests:    Reason(s) for arrest:    In past year, has patient been incarcerated? No  Number of incarcerations:    Reason(s) for  incarceration:    In past year, has patient received medication-assisted treatment? Yes, Opioid Treatment Programs (OPT), Yes, Office-based Opioid Treatment (OBOT)  In past year, patient received the following treatments: Individual therapy, Residential treatment (non-hospital)  In past year, has patient received any harm reduction services? Yes  Did this include any of the following? Purchasing syringes from a pharmacy, Making contact with an SEP  In past year, has patient received care from a mental health provider for diagnosis other than SUD? Yes([facility])  In past year, is this first time patient has overdosed? Yes  Number of past overdoses:    In past year, is this first time patient has been hospitalized for an overdose? Yes  Number of hospitalizations for overdose(s):    Is patient currently receiving treatment for a mental health diagnosis? Yes  Patient reports experiencing difficulty participating in SUD treatment: No    Most important reason(s) for this difficulty?    Has patient received prior services for treatment?    In past, patient has received services from following agencies:    Plan of Care:  Suggested follow up at these agencies/treatment centers: Other (comment)(inpatient services )  Other information: CPSS talked with Pt for motivational interviewing. CPSS discussed what concerns or issues that he has and what are his plans at the time. CPSS talked with Pt and made him aware that there are several facilities that he can seek proper help for his substance abuse. CPSS was made aware that Pt does not want help with finding a facility because he is to be checked into a  facility when he gets back to [place]. CPSS left contact information for Pt to call when he feels that he needs assistance in the community.    Aaron Edelman Artemis Koller, CPSS  03/17/2017 11:25 AM

## 2017-03-18 DIAGNOSIS — F17210 Nicotine dependence, cigarettes, uncomplicated: Secondary | ICD-10-CM

## 2017-03-18 DIAGNOSIS — F1920 Other psychoactive substance dependence, uncomplicated: Secondary | ICD-10-CM

## 2017-03-18 DIAGNOSIS — F1414 Cocaine abuse with cocaine-induced mood disorder: Secondary | ICD-10-CM | POA: Diagnosis present

## 2017-03-18 DIAGNOSIS — F1520 Other stimulant dependence, uncomplicated: Secondary | ICD-10-CM

## 2017-03-18 DIAGNOSIS — F1210 Cannabis abuse, uncomplicated: Secondary | ICD-10-CM | POA: Diagnosis not present

## 2017-03-18 MED ORDER — GABAPENTIN 300 MG PO CAPS: 300.0000 mg | ORAL_CAPSULE | Freq: Three times a day (TID) | ORAL | 0 refills | Status: DC

## 2017-03-18 NOTE — ED Notes (Signed)
Pt discharged ambulatory after RX reviewed.  Pt was happy,calm, and cooperative.  Pt had plan to reach out to NA for help getting clean.  All belongings were returned to patient.

## 2017-03-18 NOTE — ED Notes (Signed)
Pts mom called to say that this patient is not ready for discharge and she states he is suicidal.  Pt is in hallway laughing at this time with other patients.  Pt told us he was ready for discharge and has a place to go.

## 2017-03-18 NOTE — ED Notes (Signed)
Hourly rounding reveals patient sleeping in room. No complaints, stable, in no acute distress. Q15 minute rounds and monitoring via Security Cameras to continue. 

## 2017-03-18 NOTE — Consult Note (Signed)
[facility] Consult   Reason for Consult:  Cocaine and meth abuse with suicidal ideations Referring Physician:  EDP Patient Identification: [name] MRN:  [id number] Principal Diagnosis: Cocaine abuse with cocaine-induced mood disorder [facility]) Diagnosis:   Patient Active Problem List   Diagnosis Date Noted  . Cocaine abuse with cocaine-induced mood disorder [facility]) [F14.14] 03/18/2017    Priority: High  . Amphetamine use disorder, severe ([place]) [F15.20] 07/10/2016    Priority: High  . Polysubstance dependence including opioid type drug without complication, episodic abuse ([place]) [F19.20] 07/09/2016    Priority: High  . Encounter for well adult exam with abnormal findings [N27.78] 12/20/2015  . Bruises easily [R23.8] 12/20/2015  . Squamous cell carcinoma [C44.92] 05/19/2015  . Itching [L29.9] 05/19/2015  . Anxiety disorder [F41.9] 12/05/2014  . 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 male patient does not warrant admission.  HPI:  36 yo male who presented to the ED for drug withdrawal and suicidal ideations.  He reports he has suicidal ideations when he is going through detox.  He was using meth to get high and heroin to come down.  He has rehab set-up but has to go to court on Wednesday for a misdemeanor.  No suicidal/homicidal ideations,hallucinations, or withdrawal symptoms.  He wants to leave and return to [place] to go to court.  Stable for discharge.  Past Psychiatric History: substance abuse  Risk to Self: None Risk to Others: Homicidal Ideation: No Thoughts of Harm to Others: No Current Homicidal Intent: No Current Homicidal Plan: No Access to Homicidal Means: No Identified Victim: (none) History of harm to others?: No Assessment of Violence: On admission Violent Behavior Description: (none) Does patient have access to weapons?: No Criminal Charges  Pending?: Yes Describe Pending Criminal Charges: (B&E) Does patient have a court date: Yes(03/20/17) Court Date: 03/20/17 Prior Inpatient Therapy: Prior Inpatient Therapy: Yes Prior Therapy Dates: (2 times in October) Prior Therapy Facilty/Provider(s): [facility] Reason for Treatment: (drug use) Prior Outpatient Therapy: Prior Outpatient Therapy: No Prior Therapy Dates: NA Prior Therapy Facilty/Provider(s): (none) Reason for Treatment: (heroin and methamphetamine use) Does patient have an ACCT team?: No Does patient have Intensive In-House Services?  : No Does patient have [facility] services? : No Does patient have P4CC services?: No  Past Medical History:  Past Medical History:  Diagnosis Date  . Anemia   . Anxiety   . Arthritis   . Depression   . Hepatitis C   . History of kidney stones   . Kidney stone   . Opiate dependence ([place])     Past Surgical History:  Procedure Laterality Date  . APPENDECTOMY    . left axillary node biopsy    . RADICAL NECK DISSECTION Left   . TONSILLECTOMY     Family History:  Family History  Problem Relation Age of Onset  . Kidney cancer Father        renal cell carcenoma  . Hodgkin's lymphoma Mother   . Colon cancer Paternal Grandfather        great  . Lung cancer Paternal Grandmother   . Diabetes Maternal Grandmother   . Pancreatic cancer Maternal Grandmother   . Melanoma Maternal Grandfather    Family Psychiatric  History: none Social History:  Social History   Substance and Sexual Activity  Alcohol Use Yes   Comment: "rarely"     Social History  Substance and Sexual Activity  Drug Use Yes  . Frequency: 7.0 times per week  . Types: Marijuana, Methamphetamines, IV   Comment: meth and heroin use today    Social History   Socioeconomic History  . Marital status: Single    Spouse name: None  . Number of children: 1  . Years of education: 22  . Highest education level: None  Social Needs  . Financial  resource strain: None  . Food insecurity - worry: None  . Food insecurity - inability: None  . Transportation needs - medical: None  . Transportation needs - non-medical: None  Occupational History  . Occupation: Dealer   Tobacco Use  . Smoking status: Current Some Day Smoker    Packs/day: 0.50    Years: 9.00    Pack years: 4.50    Types: Cigarettes  . Smokeless tobacco: Former Systems developer    Types: [place] date: 04/16/2008  Substance and Sexual Activity  . Alcohol use: Yes    Comment: "rarely"  . Drug use: Yes    Frequency: 7.0 times per week    Types: Marijuana, Methamphetamines, IV    Comment: meth and heroin use today  . Sexual activity: Not Currently  Other Topics Concern  . None  Social History Narrative   Fun: Play guitar and hang out with his daughter   Additional Social History:    Allergies:   Allergies  Allergen Reactions  . Ceclor [Cefaclor] Other (See Comments)    Does not recall, was a small child     Labs:  Results for orders placed or performed during the hospital encounter of 03/17/17 (from the past 48 hour(s))  Urine rapid drug screen (hosp performed)     Status: Abnormal   Collection Time: 03/17/17  9:34 AM  Result Value Ref Range   Opiates NONE DETECTED NONE DETECTED   Cocaine POSITIVE (A) NONE DETECTED   Benzodiazepines NONE DETECTED NONE DETECTED   Amphetamines POSITIVE (A) NONE DETECTED   Tetrahydrocannabinol NONE DETECTED NONE DETECTED   Barbiturates NONE DETECTED NONE DETECTED    Comment:        DRUG SCREEN FOR MEDICAL PURPOSES ONLY.  IF CONFIRMATION IS NEEDED FOR ANY PURPOSE, NOTIFY LAB WITHIN 5 DAYS.        LOWEST DETECTABLE LIMITS FOR URINE DRUG SCREEN Drug Class       Cutoff (ng/mL) Amphetamine      1000 Barbiturate      200 Benzodiazepine   409 Tricyclics       735 Opiates          300 Cocaine          300 THC              50   Comprehensive metabolic panel     Status: Abnormal   Collection Time: 03/17/17 10:00 AM   Result Value Ref Range   Sodium 137 135 - 145 mmol/L   Potassium 3.5 3.5 - 5.1 mmol/L   Chloride 101 101 - 111 mmol/L   CO2 26 22 - 32 mmol/L   Glucose, Bld 90 65 - 99 mg/dL   BUN 16 6 - 20 mg/dL   Creatinine, Ser 0.86 0.61 - 1.24 mg/dL   Calcium 9.4 8.9 - 10.3 mg/dL   Total Protein 8.0 6.5 - 8.1 g/dL   Albumin 4.4 3.5 - 5.0 g/dL   AST 72 (H) 15 - 41 U/L   ALT 54 17 - 63 U/L  Alkaline Phosphatase 51 38 - 126 U/L   Total Bilirubin 1.1 0.3 - 1.2 mg/dL   GFR calc non Af Amer >60 >60 mL/min   GFR calc Af Amer >60 >60 mL/min    Comment: (NOTE) The eGFR has been calculated using the CKD EPI equation. This calculation has not been validated in all clinical situations. eGFR's persistently <60 mL/min signify possible Chronic Kidney Disease.    Anion gap 10 5 - 15  Ethanol     Status: None   Collection Time: 03/17/17 10:00 AM  Result Value Ref Range   Alcohol, Ethyl (B) <10 <10 mg/dL    Comment:        LOWEST DETECTABLE LIMIT FOR SERUM ALCOHOL IS 10 mg/dL FOR MEDICAL PURPOSES ONLY   CBC with Diff     Status: None   Collection Time: 03/17/17 10:00 AM  Result Value Ref Range   WBC 8.6 4.0 - 10.5 K/uL   RBC 5.04 4.22 - 5.81 MIL/uL   Hemoglobin 15.5 13.0 - 17.0 g/dL   HCT 43.3 39.0 - 52.0 %   MCV 85.9 78.0 - 100.0 fL   MCH 30.8 26.0 - 34.0 pg   MCHC 35.8 30.0 - 36.0 g/dL   RDW 13.1 11.5 - 15.5 %   Platelets 386 150 - 400 K/uL   Neutrophils Relative % 75 %   Neutro Abs 6.4 1.7 - 7.7 K/uL   Lymphocytes Relative 17 %   Lymphs Abs 1.5 0.7 - 4.0 K/uL   Monocytes Relative 7 %   Monocytes Absolute 0.6 0.1 - 1.0 K/uL   Eosinophils Relative 1 %   Eosinophils Absolute 0.1 0.0 - 0.7 K/uL   Basophils Relative 0 %   Basophils Absolute 0.0 0.0 - 0.1 K/uL    Current Facility-Administered Medications  Medication Dose Route Frequency Provider Last Rate Last Dose  . cloNIDine (CATAPRES) tablet 0.1 mg  0.1 mg Oral QID Patrecia Pour, NP   0.1 mg at 03/17/17 2202   Followed by  .  [START ON 03/19/2017] cloNIDine (CATAPRES) tablet 0.1 mg  0.1 mg Oral BH-qamhs Consuela Widener, Asa Saunas, NP       Followed by  . [START ON 03/21/2017] cloNIDine (CATAPRES) tablet 0.1 mg  0.1 mg Oral QAC breakfast Patrecia Pour, NP      . dicyclomine (BENTYL) tablet 20 mg  20 mg Oral Q6H PRN Patrecia Pour, NP      . gabapentin (NEURONTIN) capsule 300 mg  300 mg Oral TID Patrecia Pour, NP   300 mg at 03/17/17 2203  . hydrOXYzine (ATARAX/VISTARIL) tablet 25 mg  25 mg Oral Q6H PRN Patrecia Pour, NP      . loperamide (IMODIUM) capsule 2-4 mg  2-4 mg Oral PRN Patrecia Pour, NP      . methocarbamol (ROBAXIN) tablet 500 mg  500 mg Oral Q8H PRN Patrecia Pour, NP      . naproxen (NAPROSYN) tablet 500 mg  500 mg Oral BID PRN Patrecia Pour, NP      . ondansetron (ZOFRAN-ODT) disintegrating tablet 4 mg  4 mg Oral Q6H PRN Patrecia Pour, NP       Current Outpatient Medications  Medication Sig Dispense Refill  . gabapentin (NEURONTIN) 100 MG capsule Take 1 capsule (100 mg total) by mouth 2 (two) times daily. 60 capsule 0  . QUEtiapine (SEROQUEL) 100 MG tablet Take 1 tablet (100 mg total) by mouth at bedtime. 30 tablet 0  . hydrOXYzine (  ATARAX/VISTARIL) 25 MG tablet Take 1 tablet (25 mg total) by mouth every 6 (six) hours as needed for anxiety. 30 tablet 0  . traZODone (DESYREL) 50 MG tablet Take 1 tablet (50 mg total) by mouth at bedtime as needed for sleep. (Patient not taking: Reported on 03/17/2017) 30 tablet 0    Musculoskeletal: Strength & Muscle Tone: within normal limits Gait & Station: normal Patient leans: N/A  Psychiatric Specialty Exam: Physical Exam  Constitutional: He is oriented to person, place, and time. He appears well-developed and well-nourished.  HENT:  Head: Normocephalic.  Neck: Normal range of motion.  Respiratory: Effort normal.  Musculoskeletal: Normal range of motion.  Neurological: He is alert and oriented to person, place, and time.  Psychiatric: He has a normal mood  and affect. His speech is normal and behavior is normal. Judgment and thought content normal. Cognition and memory are normal.    Review of Systems  Psychiatric/Behavioral: Positive for substance abuse.  All other systems reviewed and are negative.   Blood pressure 97/67, pulse 70, temperature 97.7 F (36.5 C), temperature source Oral, resp. rate 18, height _0  (1.702 m), weight 74.8 kg (165 lb), SpO2 98 %.Body mass index is 25.84 kg/m.  General Appearance: Casual  Eye Contact:  Good  Speech:  Normal Rate  Volume:  Normal  Mood:  Euthymic  Affect:  Congruent  Thought Process:  Coherent and Descriptions of Associations: Intact  Orientation:  Full (Time, Place, and Person)  Thought Content:  WDL and Logical  Suicidal Thoughts:  No  Homicidal Thoughts:  No  Memory:  Immediate;   Good Recent;   Good Remote;   Good  Judgement:  Fair  Insight:  Fair  Psychomotor Activity:  Normal  Concentration:  Concentration: Good and Attention Span: Good  Recall:  Good  Fund of Knowledge:  Good  Language:  Good  Akathisia:  No  Handed:  Right  AIMS (if indicated):     Assets:  Leisure Time Physical Health Resilience Social Support  ADL's:  Intact  Cognition:  WNL  Sleep:        Treatment Plan Summary: Daily contact with patient to assess and evaluate symptoms and progress in treatment, Medication management and Plan cocaine abuse with cocaine induced mood disorder:  -Crisis stabilization -Medication management:  Gabapentin 300 mg TID started along with the Clonidine Opiate withdrawal protocol -Individual and substance abuse counseling -Peer support referral  Disposition: No evidence of imminent risk to self or others at present.    Waylan Boga, NP 03/18/2017 10:23 AM

## 2017-03-18 NOTE — BH Assessment (Signed)
[facility] Assessment Progress Note  Per Hampton Abbot, MD, this pt does not require psychiatric hospitalization at this time.  Pt is to be discharged from [facility].  He has reported to Peer Support that he has made arrangements to follow up with an unspecified substance abuse treatment provider in the [place] area.  Pt's nurse, Nena Jordan, has been notified.  Jalene Mullet, [facility] Triage Specialist [phone]

## 2017-03-18 NOTE — [facility] Suicide Risk Assessment (Signed)
Suicide Risk Assessment  Discharge Assessment   [facility] Discharge Suicide Risk Assessment   Principal Problem: Cocaine abuse with cocaine-induced mood disorder [facility]) Discharge Diagnoses:  Patient Active Problem List   Diagnosis Date Noted  . Cocaine abuse with cocaine-induced mood disorder [facility]) [F14.14] 03/18/2017    Priority: High  . 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 Polysubstance dependence including opioid type drug without complication, episodic abuse ([place]) [F19.20] 07/09/2016    Priority: High  . Encounter for well adult exam with abnormal findings [U72.53] 12/20/2015  . Bruises easily [R23.8] 12/20/2015  . Squamous cell carcinoma [C44.92] 05/19/2015  . Itching [L29.9] 05/19/2015  . Anxiety disorder [F41.9] 12/05/2014  . 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 He appears well-developed and well-nourished.  HENT:  Head: Normocephalic.  Neck: Normal range of motion.  Respiratory: Effort normal.  Musculoskeletal: Normal range of motion.  Neurological: He is alert and oriented to person, place, and time.  Psychiatric: He has a normal mood and affect. His speech is normal and behavior is normal. Judgment and thought content normal. Cognition and memory are normal.    Review of Systems  Psychiatric/Behavioral: Positive for substance abuse.  All other systems reviewed and are negative.   Blood pressure 97/67, pulse 70, temperature 97.7 F (36.5 C), temperature source Oral, resp. rate 18, height 5\' 7"  (1.702 m), weight 74.8 kg (165 lb), SpO2 98 %.Body mass index is 25.84 kg/m.  General Appearance: Casual  Eye Contact:  Good  Speech:  Normal Rate  Volume:  Normal  Mood:  Euthymic   Affect:  Congruent  Thought Process:  Coherent and Descriptions of Associations: Intact  Orientation:  Full (Time, Place, and Person)  Thought Content:  WDL and Logical  Suicidal Thoughts:  No  Homicidal Thoughts:  No  Memory:  Immediate;   Good Recent;   Good Remote;   Good  Judgement:  Fair  Insight:  Fair  Psychomotor Activity:  Normal  Concentration:  Concentration: Good and Attention Span: Good  Recall:  Good  Fund of Knowledge:  Good  Language:  Good  Akathisia:  No  Handed:  Right  AIMS (if indicated):     Assets:  Leisure Time Physical Health Resilience Social Support  ADL's:  Intact  Cognition:  WNL  Sleep:      Mental Status Per Nursing Assessment::   On Admission:   cocaine and meth withdrawal with suicidal ideations  Demographic Factors:  Male and Caucasian  Loss Factors: Legal issues  Historical Factors: NA  Risk Reduction Factors:   Sense of responsibility to family, Living with another person, especially a relative and Positive social support  Continued Clinical Symptoms:  None  Cognitive Features That Contribute To Risk:  None    Suicide Risk:  Minimal: No identifiable suicidal ideation.  Patients presenting with no risk factors but with morbid ruminations; may be classified as minimal risk based on the severity of the depressive symptoms    Plan Of Care/Follow-up recommendations:  Activity:  as tolerated Diet:  heart healthy diet  LORD, JAMISON, NP 03/18/2017, 11:58 AM

## 2017-10-09 ENCOUNTER — Emergency Department (HOSPITAL_COMMUNITY)
Admission: EM | Admit: 2017-10-09 | Discharge: 2017-10-09 | Disposition: A | Discharge status: Home / Self Care | Payer: BLUE CROSS/BLUE SHIELD | Attending: Emergency Medicine | Admitting: Emergency Medicine

## 2017-10-09 ENCOUNTER — Encounter (HOSPITAL_COMMUNITY): Payer: Self-pay

## 2017-10-09 ENCOUNTER — Emergency Department (HOSPITAL_COMMUNITY): Payer: BLUE CROSS/BLUE SHIELD

## 2017-10-09 ENCOUNTER — Other Ambulatory Visit: Payer: Self-pay

## 2017-10-09 DIAGNOSIS — Z85828 Personal history of other malignant neoplasm of skin: Secondary | ICD-10-CM | POA: Insufficient documentation

## 2017-10-09 DIAGNOSIS — L03114 Cellulitis of left upper limb: Secondary | ICD-10-CM | POA: Diagnosis not present

## 2017-10-09 DIAGNOSIS — R609 Edema, unspecified: Secondary | ICD-10-CM | POA: Diagnosis not present

## 2017-10-09 DIAGNOSIS — R509 Fever, unspecified: Secondary | ICD-10-CM | POA: Diagnosis not present

## 2017-10-09 DIAGNOSIS — F1910 Other psychoactive substance abuse, uncomplicated: Secondary | ICD-10-CM | POA: Diagnosis not present

## 2017-10-09 DIAGNOSIS — F17210 Nicotine dependence, cigarettes, uncomplicated: Secondary | ICD-10-CM | POA: Insufficient documentation

## 2017-10-09 DIAGNOSIS — Z79899 Other long term (current) drug therapy: Secondary | ICD-10-CM | POA: Diagnosis not present

## 2017-10-09 DIAGNOSIS — M79602 Pain in left arm: Secondary | ICD-10-CM | POA: Diagnosis present

## 2017-10-09 LAB — BASIC METABOLIC PANEL
ANION GAP: 10 (ref 5–15)
BUN: 11 mg/dL (ref 6–20)
CHLORIDE: 99 mmol/L (ref 98–111)
CO2: 27 mmol/L (ref 22–32)
Calcium: 9.2 mg/dL (ref 8.9–10.3)
Creatinine, Ser: 0.96 mg/dL (ref 0.61–1.24)
GFR calc non Af Amer: >60 mL/min (ref >60)
Glucose, Bld: 91 mg/dL (ref 70–99)
POTASSIUM: 3 mmol/L — AB (ref 3.5–5.1)
SODIUM: 136 mmol/L (ref 135–145)

## 2017-10-09 LAB — CBC WITH DIFFERENTIAL/PLATELET
BASOS ABS: 0.1 10*3/uL (ref 0.0–0.1)
Basophils Relative: 1 %
EOS ABS: 0.1 10*3/uL (ref 0.0–0.7)
EOS PCT: 1 %
HCT: 38.9 % — ABNORMAL LOW (ref 39.0–52.0)
HEMOGLOBIN: 14.3 g/dL (ref 13.0–17.0)
LYMPHS ABS: 1.7 10*3/uL (ref 0.7–4.0)
LYMPHS PCT: 21 %
MCH: 31 pg (ref 26.0–34.0)
MCHC: 36.8 g/dL — ABNORMAL HIGH (ref 30.0–36.0)
MCV: 84.4 fL (ref 78.0–100.0)
Monocytes Absolute: 0.9 10*3/uL (ref 0.1–1.0)
Monocytes Relative: 11 %
NEUTROS PCT: 66 %
Neutro Abs: 5.6 10*3/uL (ref 1.7–7.7)
PLATELETS: 359 10*3/uL (ref 150–400)
RBC: 4.61 MIL/uL (ref 4.22–5.81)
RDW: 12.8 % (ref 11.5–15.5)
WBC: 8.4 10*3/uL (ref 4.0–10.5)

## 2017-10-09 MED ORDER — SODIUM CHLORIDE 0.9 % IV BOLUS
1000.0000 mL | Freq: Once | INTRAVENOUS | Status: AC
  Administered 2017-10-09: 1000 mL via INTRAVENOUS

## 2017-10-09 MED ORDER — CLINDAMYCIN HCL 150 MG PO CAPS: 300.0000 mg | ORAL_CAPSULE | Freq: Three times a day (TID) | ORAL | 0 refills | Status: AC

## 2017-10-09 MED ORDER — KETOROLAC TROMETHAMINE 15 MG/ML IJ SOLN
15.0000 mg | Freq: Once | INTRAMUSCULAR | Status: AC
  Administered 2017-10-09: 15 mg via INTRAVENOUS
  Filled 2017-10-09: qty 1

## 2017-10-09 MED ORDER — CLINDAMYCIN PHOSPHATE 600 MG/50ML IV SOLN
600.0000 mg | Freq: Once | INTRAVENOUS | Status: AC
  Administered 2017-10-09: 600 mg via INTRAVENOUS
  Filled 2017-10-09: qty 50

## 2017-10-09 MED ORDER — IOHEXOL 300 MG/ML  SOLN
75.0000 mL | Freq: Once | INTRAMUSCULAR | Status: AC | PRN
  Administered 2017-10-09: 75 mL via INTRAVENOUS

## 2017-10-09 MED ORDER — POTASSIUM CHLORIDE CRYS ER 20 MEQ PO TBCR
40.0000 meq | EXTENDED_RELEASE_TABLET | Freq: Once | ORAL | Status: AC
  Administered 2017-10-09: 40 meq via ORAL
  Filled 2017-10-09: qty 2

## 2017-10-09 NOTE — ED Notes (Signed)
Patient transported to CT 

## 2017-10-09 NOTE — Discharge Instructions (Addendum)
Take antibiotics as prescribed.  Take the entire course, even if your symptoms improve. Use Tylenol or ibuprofen as needed for pain. Stop using drugs. Return to the emergency room if you develop persistent high fevers despite greater than 24 hours of antibiotics, inability to bend your elbow or your wrist, or any new or concerning symptoms.

## 2017-10-09 NOTE — ED Triage Notes (Signed)
Patient c/o left forearm swelling x 3 days. Patient does admit to using Heroin IV. Patient states he did inject in the left forearm 3 days ago. Patient states he last used Heroin and meth yesterday. Patient reports that he had a temp of 102 last night and has been taking Tylenol for his fever.

## 2017-10-09 NOTE — ED Provider Notes (Signed)
[facility] DEPT Provider Note   CSN: [id number] Arrival date & time: 10/09/17  8921     History   Chief Complaint Chief Complaint  Patient presents with  . Arm Swelling    HPI [name] is a 37 y.o. male presenting for evaluation of fever and left arm pain.  Patient states that approximately 5 days ago, he injected heroin into his left arm, but missed the vein.  He had some mild swelling which improved over the course of the next 24 hours.  2 days ago, he hit his left arm again, and had significant pain and swelling.  Yesterday he developed a fever, up to 102.  He took Tylenol with improvement of his fever.  He states since yesterday, he has been having generalized body aches, and feels sick all over.  He denies difficulty breathing or shortness of breath.  He denies nausea or vomiting.  He last used heroin yesterday at 3 PM, last used meth at 7 PM.  He denies other drug use.  He takes seroquel and zoloft daily.   HPI  Past Medical History:  Diagnosis Date  . Anemia   . Anxiety   . Arthritis   . Depression   . Hepatitis C   . History of kidney stones   . Kidney stone   . Opiate dependence [facility])     Patient Active Problem List   Diagnosis Date Noted  . Cocaine abuse with cocaine-induced mood disorder ([place]) 03/18/2017  . 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 Polysubstance dependence including opioid type drug without complication, episodic abuse ([place]) 07/09/2016  . Encounter for well adult exam with abnormal findings 12/20/2015  . Bruises easily 12/20/2015  . Squamous cell carcinoma 05/19/2015  . Itching 05/19/2015  . Anxiety disorder 12/05/2014  . 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 APPENDECTOMY    . left axillary node biopsy    . RADICAL NECK DISSECTION Left   . TONSILLECTOMY          Home Medications    Prior to Admission medications   Medication Sig  Start Date End Date Taking? Authorizing Provider  QUEtiapine (SEROQUEL) 100 MG tablet Take 1 tablet (100 mg total) by mouth at bedtime. Patient taking differently: Take 200 mg by mouth at bedtime.  12/07/16  Yes Money, Lowry Ram, FNP  sertraline (ZOLOFT) 100 MG tablet Take 150 mg by mouth daily.   Yes [provider]  clindamycin (CLEOCIN) 150 MG capsule Take 2 capsules (300 mg total) by mouth 3 (three) times daily for 7 days. 10/09/17 10/16/17  Uriel Horkey, PA-C  gabapentin (NEURONTIN) 300 MG capsule Take 1 capsule (300 mg total) by mouth 3 (three) times daily. Patient not taking: Reported on 10/09/2017 03/18/17   Patrecia Pour, NP  hydrOXYzine (ATARAX/VISTARIL) 25 MG tablet Take 1 tablet (25 mg total) by mouth every 6 (six) hours as needed for anxiety. Patient not taking: Reported on 10/09/2017 12/07/16   Money, Lowry Ram, FNP  traZODone (DESYREL) 50 MG tablet Take 1 tablet (50 mg total) by mouth at bedtime as needed for sleep. Patient not taking: Reported on 03/17/2017 12/07/16   Money, Lowry Ram, FNP    Family History Family History  Problem Relation Age of Onset  . Kidney cancer Father        renal cell carcenoma  . Hodgkin's lymphoma Mother   . Colon cancer Paternal Grandfather  great  . Lung cancer Paternal Grandmother   . Diabetes Maternal Grandmother   . Pancreatic cancer Maternal Grandmother   . Melanoma Maternal Grandfather     Social History Social History   Tobacco Use  . Smoking status: Current Some Day Smoker    Packs/day: 0.50    Years: 9.00    Pack years: 4.50    Types: Cigarettes  . Smokeless tobacco: Former Systems developer    Types: Chew    Quit date: 04/16/2008  Substance Use Topics  . Alcohol use: Yes    Comment: "rarely"  . Drug use: Yes    Frequency: 7.0 times per week    Types: Marijuana, Methamphetamines, IV    Comment: meth and heroin use today     Allergies   Ceclor [cefaclor]   Review of Systems Review of Systems  Constitutional:  Positive for fever.  Musculoskeletal: Positive for myalgias.  All other systems reviewed and are negative.    Physical Exam Updated Vital Signs BP (!) 166/96   Pulse 76   Temp 98.4 F (36.9 C) (Oral)   Resp 18   Ht 5\' 8"  (1.727 m)   Wt 81.6 kg (180 lb)   SpO2 99%   BMI 27.37 kg/m   Physical Exam  Constitutional: He is oriented to person, place, and time. He appears well-developed.  HENT:  Head: Normocephalic and atraumatic.  Eyes: Pupils are equal, round, and reactive to light. Conjunctivae and EOM are normal.  Neck: Normal range of motion. Neck supple.  Cardiovascular: Regular rhythm and intact distal pulses.  tachycardic  Pulmonary/Chest: Effort normal and breath sounds normal. No respiratory distress. He has no wheezes.  Clear lung sounds  Abdominal: Soft. He exhibits no distension and no mass. There is no tenderness. There is no guarding.  Musculoskeletal: Normal range of motion. He exhibits edema. He exhibits no tenderness.  Warmth, redness, swelling of the left forearm without streaking or extension towards the elbow or wrist.  Radial pulses intact bilaterally.  Grip strength intact bilaterally.  Full active range of motion of the elbow and the wrist without pain.  Neurological: He is alert and oriented to person, place, and time. No sensory deficit.  Skin: Skin is warm and dry. Capillary refill takes less than 2 seconds. There is erythema.  Psychiatric: He has a normal mood and affect.  Nursing note and vitals reviewed.    ED Treatments / Results  Labs (all labs ordered are listed, but only abnormal results are displayed) Labs Reviewed  CBC WITH DIFFERENTIAL/PLATELET - Abnormal; Notable for the following components:      Result Value   HCT 38.9 (*)    MCHC 36.8 (*)    All other components within normal limits  BASIC METABOLIC PANEL - Abnormal; Notable for the following components:   Potassium 3.0 (*)    All other components within normal limits  CULTURE,  BLOOD (ROUTINE X 2)  CULTURE, BLOOD (ROUTINE X 2)    EKG None  Radiology Ct Forearm Left W Contrast  Result Date: 10/09/2017 CLINICAL DATA:  Left forearm pain and swelling for 3 days. EXAM: CT OF THE UPPER LEFT EXTREMITY WITH CONTRAST TECHNIQUE: Multidetector CT imaging of the upper left extremity was performed according to the standard protocol following intravenous contrast administration. COMPARISON:  None. CONTRAST:  32mL OMNIPAQUE IOHEXOL 300 MG/ML  SOLN FINDINGS: The elbow and wrist joints appear normal. No findings suspicious for septic arthritis. The radius and ulna are unremarkable. No evidence of osteomyelitis. No obvious  soft tissue abscess is identified. No radiopaque foreign body. No evidence to suggest pyomyositis. IMPRESSION: No definite CT findings for soft tissue abscess, pyomyositis, septic arthritis or osteomyelitis. If symptoms persist or worsen MRI would be a much better test for further evaluation. Electronically Signed   By: Marijo Sanes M.D.   On: 10/09/2017 11:56    Procedures Procedures (including critical care time)  Medications Ordered in ED Medications  potassium chloride SA (K-DUR,KLOR-CON) CR tablet 40 mEq (has no administration in time range)  sodium chloride 0.9 % bolus 1,000 mL (0 mLs Intravenous Stopped 10/09/17 1224)  clindamycin (CLEOCIN) IVPB 600 mg (0 mg Intravenous Stopped 10/09/17 1032)  ketorolac (TORADOL) 15 MG/ML injection 15 mg (15 mg Intravenous Given 10/09/17 1001)  iohexol (OMNIPAQUE) 300 MG/ML solution 75 mL (75 mLs Intravenous Contrast Given 10/09/17 1100)     Initial Impression / Assessment and Plan / ED Course  I have reviewed the triage vital signs and the nursing notes.  Pertinent labs & imaging results that were available during my care of the patient were reviewed by me and considered in my medical decision making (see chart for details).     Pt presenting for evaluation of left arm pain and swelling.  Physical exam shows patient  who is tachycardic, but no longer febrile.  Has obvious erythema and warmth of the left arm, no obvious abscess.  No extension into the elbow or wrist joints.  No streaking.  Will start IV antibiotics, IV fluids, obtain labs, and CT of the arm to rule out abscess.  Labs reassuring, no leukocytosis.  Heart rate has improved with fluids and antibiotics.  CT without obvious abscess.  Blood cultures obtained prior to antibiotics, as patient is at higher risk for endocarditis.  Reporting generalized body aches, but no specific chest pain or shortness of breath.  However blood cultures are positive, consider need for further treatment.  Discussed with patient.  Discussed importance of not using drugs.  Offered peer support and resources, patient states he already has this. Will d/c with abx.  At this time, patient appears safe for discharge.  Return precautions given.  Patient states he understands and agrees to plan.   Final Clinical Impressions(s) / ED Diagnoses   Final diagnoses:  Cellulitis of left upper extremity  Polysubstance abuse [facility])    ED Discharge Orders        Ordered    clindamycin (CLEOCIN) 150 MG capsule  3 times daily     10/09/17 1242       Ellis Koffler, PA-C 10/09/17 1246    Lajean Saver, MD 10/09/17 1552

## 2017-10-14 LAB — CULTURE, BLOOD (ROUTINE X 2)
CULTURE: NO GROWTH
Culture: NO GROWTH
Special Requests: ADEQUATE

## 2018-03-18 IMAGING — CT CT ANGIO CHEST
2 of 6 series · 18 of 36 positions shown · IV contrast (Omni 300)
Comparison: None.

CLINICAL DATA: Acute onset left chest pain and shortness of breath
today. Previous lower extremity DVT. Head and neck squamous cell
carcinoma.

EXAM:
CT ANGIOGRAPHY CHEST WITH CONTRAST
TECHNIQUE: Multidetector CT imaging of the chest was performed using the
standard protocol during bolus administration of intravenous
contrast. Multiplanar CT image reconstructions and MIPs were
obtained to evaluate the vascular anatomy.
CONTRAST:  85 mL Isovue 370

[Series 7: pe thins · axial · 0.68mm/px · z∈[+1084,+1324]mm · 17 of 269 slices shown]
[im 15/269  lung]
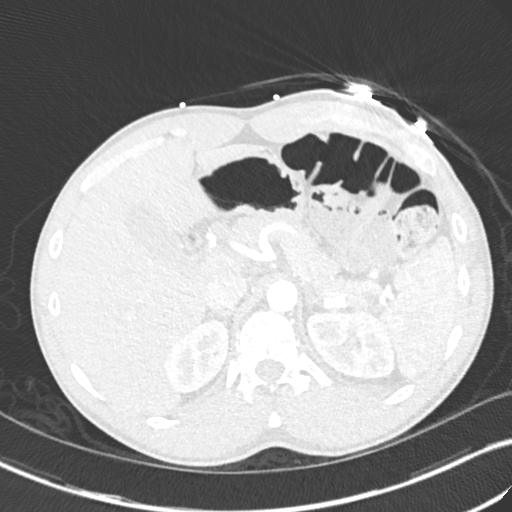
[im 30/269  mediastinal]
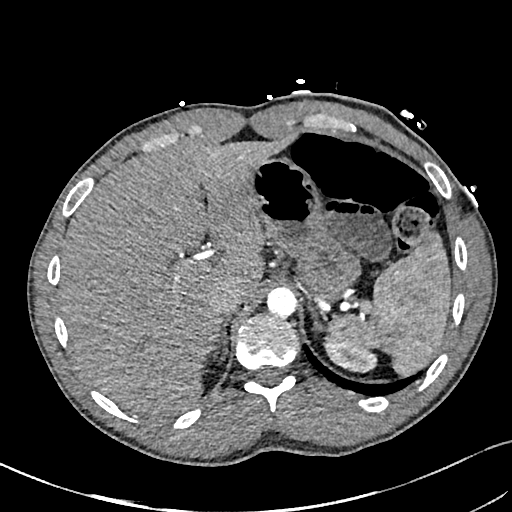
[im 45/269  lung]
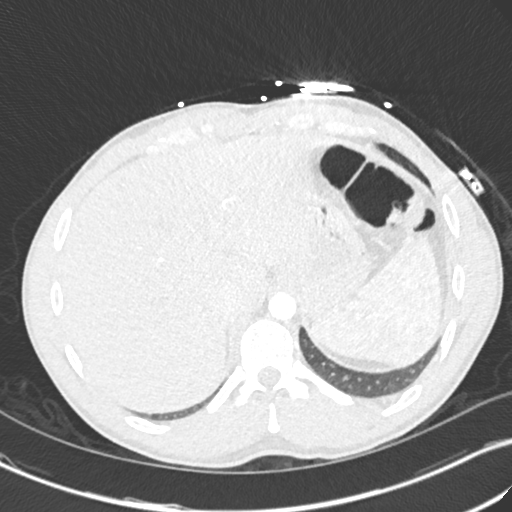
[im 60/269  mediastinal]
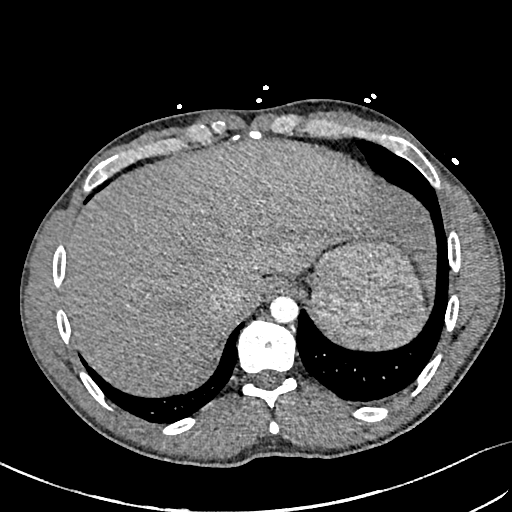
[im 75/269  lung]
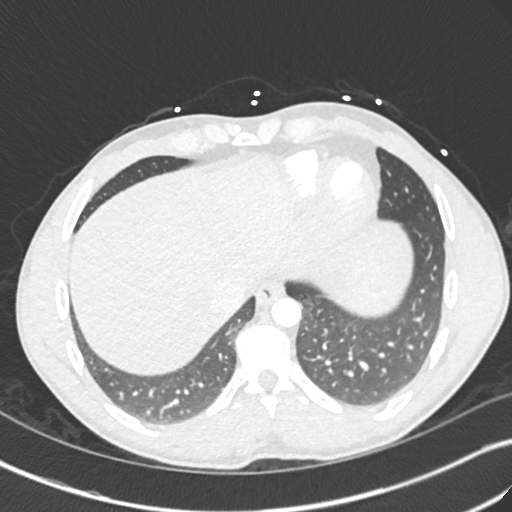
[im 90/269  mediastinal]
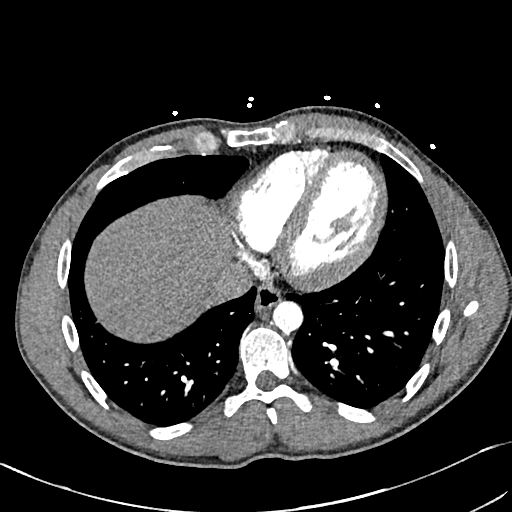
[im 105/269  lung]
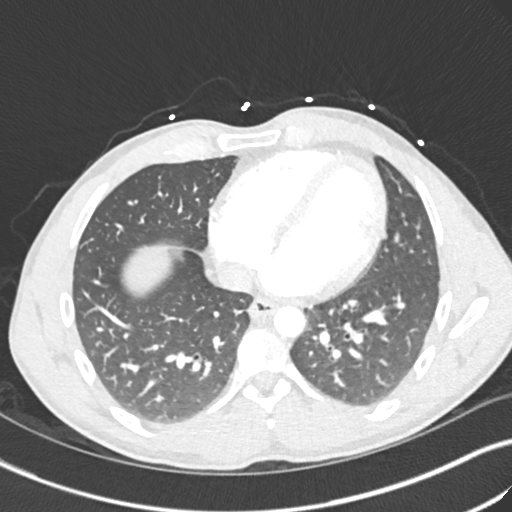
[im 120/269  mediastinal]
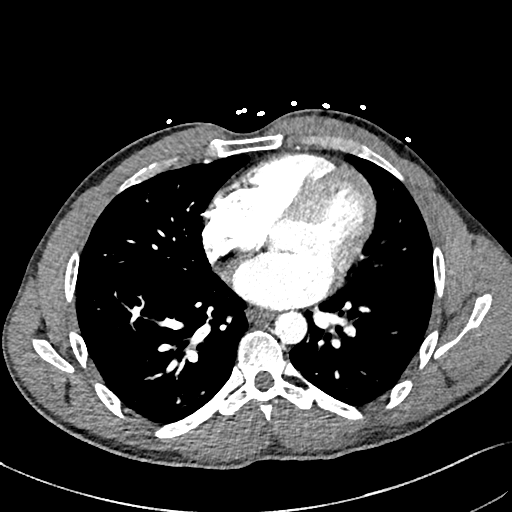
[im 135/269  lung]
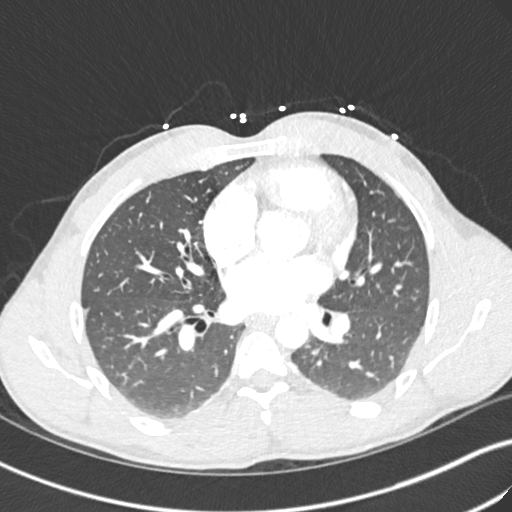
[im 149/269  mediastinal]
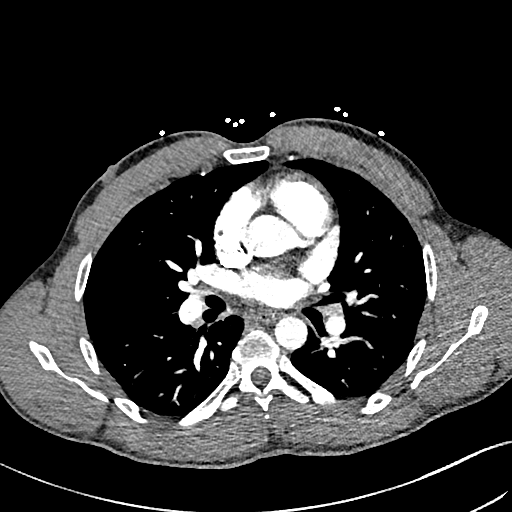
[im 164/269  lung]
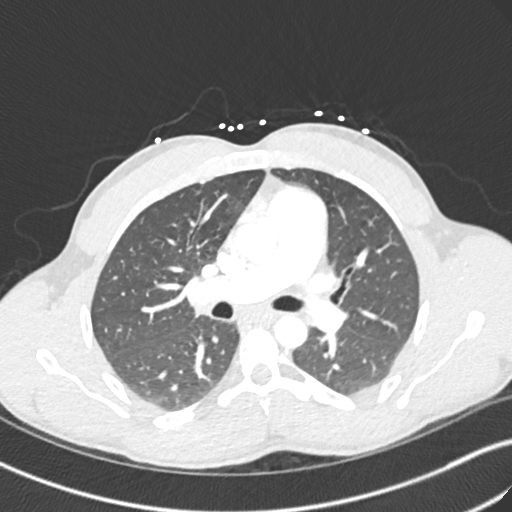
[im 179/269  mediastinal]
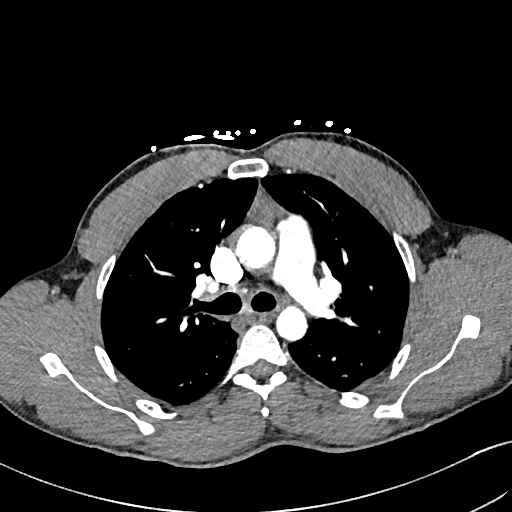
[im 194/269  lung]
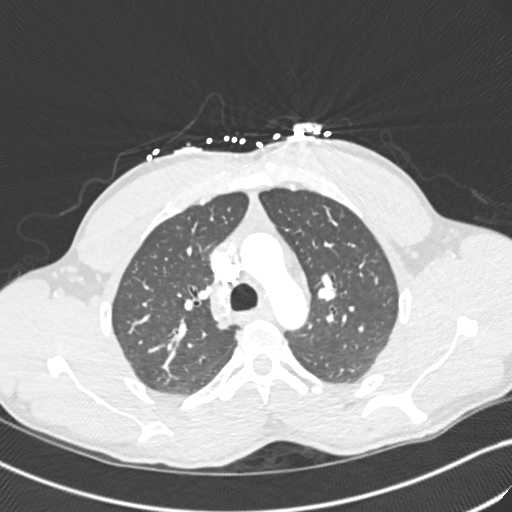
[im 209/269  mediastinal]
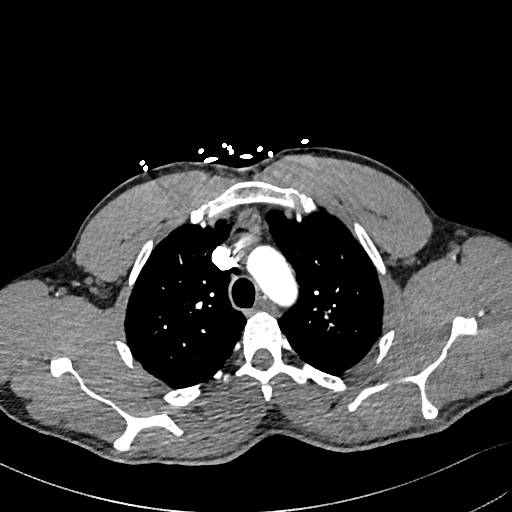
[im 224/269  lung]
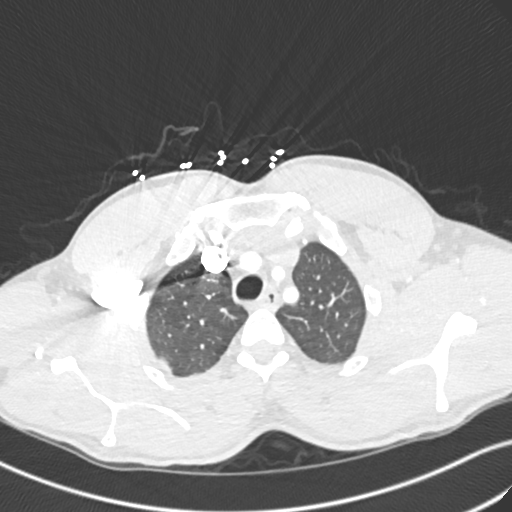
[im 239/269  mediastinal]
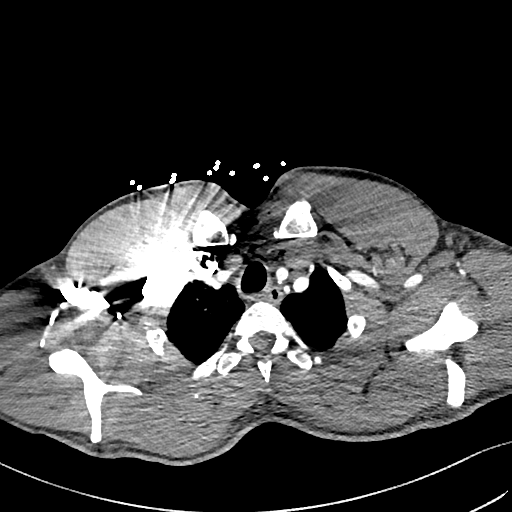
[im 254/269  lung]
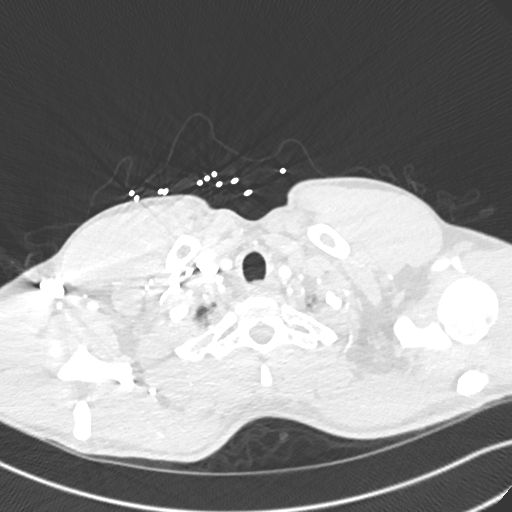

[Series 8: pe 2mm cor · coronal · 0.53mm/px · 1 of 131 slices shown]
[im 66/131  mediastinal]
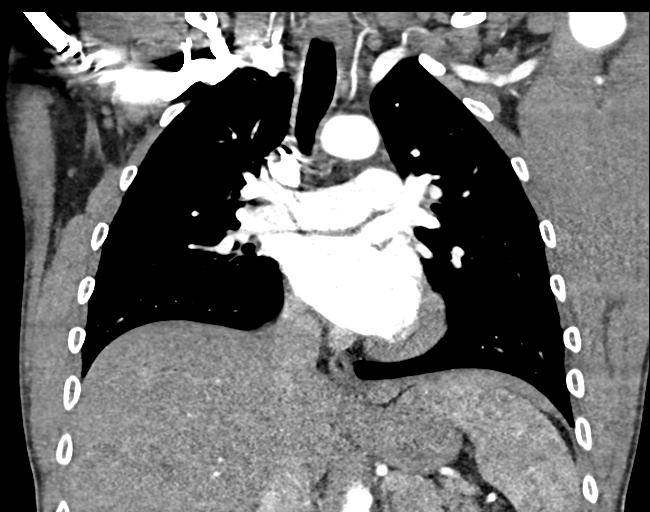

[18 of 36 positions shown; findings below may reference images not displayed]

FINDINGS: Cardiovascular: Satisfactory opacification of pulmonary arteries
noted, and no pulmonary emboli identified. No evidence of thoracic
aortic dissection or aneurysm.

Mediastinum/Nodes: No masses or pathologically enlarged lymph nodes
identified. Residual anterior mediastinal thymic tissue incidentally
noted.

Lungs/Pleura: No pulmonary mass, infiltrate, or effusion.

Upper abdomen: No acute findings.

Musculoskeletal: No suspicious bone lesions or other significant
abnormality identified.

Review of the MIP images confirms the above findings.
IMPRESSION: Negative. No evidence of pulmonary embolism or other active disease.

## 2018-07-24 ENCOUNTER — Other Ambulatory Visit: Payer: Self-pay

## 2018-07-24 ENCOUNTER — Emergency Department (HOSPITAL_COMMUNITY)
Admission: EM | Admit: 2018-07-24 | Discharge: 2018-07-24 | Disposition: A | Discharge status: Home / Self Care | Payer: BLUE CROSS/BLUE SHIELD | Attending: Emergency Medicine | Admitting: Emergency Medicine

## 2018-07-24 ENCOUNTER — Emergency Department (HOSPITAL_COMMUNITY): Payer: BLUE CROSS/BLUE SHIELD

## 2018-07-24 DIAGNOSIS — F1110 Opioid abuse, uncomplicated: Secondary | ICD-10-CM | POA: Diagnosis not present

## 2018-07-24 DIAGNOSIS — R079 Chest pain, unspecified: Secondary | ICD-10-CM | POA: Diagnosis not present

## 2018-07-24 DIAGNOSIS — Z79899 Other long term (current) drug therapy: Secondary | ICD-10-CM | POA: Insufficient documentation

## 2018-07-24 DIAGNOSIS — F1410 Cocaine abuse, uncomplicated: Secondary | ICD-10-CM | POA: Insufficient documentation

## 2018-07-24 DIAGNOSIS — F17210 Nicotine dependence, cigarettes, uncomplicated: Secondary | ICD-10-CM | POA: Diagnosis not present

## 2018-07-24 DIAGNOSIS — F1510 Other stimulant abuse, uncomplicated: Secondary | ICD-10-CM | POA: Diagnosis not present

## 2018-07-24 DIAGNOSIS — Z85828 Personal history of other malignant neoplasm of skin: Secondary | ICD-10-CM | POA: Insufficient documentation

## 2018-07-24 LAB — COMPREHENSIVE METABOLIC PANEL
ALT: 44 U/L (ref 0–44)
AST: 54 U/L — ABNORMAL HIGH (ref 15–41)
Albumin: 4 g/dL (ref 3.5–5.0)
Alkaline Phosphatase: 39 U/L (ref 38–126)
Anion gap: 8 (ref 5–15)
BUN: 15 mg/dL (ref 6–20)
CO2: 23 mmol/L (ref 22–32)
Calcium: 8.6 mg/dL — ABNORMAL LOW (ref 8.9–10.3)
Chloride: 105 mmol/L (ref 98–111)
Creatinine, Ser: 1.19 mg/dL (ref 0.61–1.24)
GFR calc Af Amer: >60 mL/min (ref >60)
GFR calc non Af Amer: >60 mL/min (ref >60)
Glucose, Bld: 99 mg/dL (ref 70–99)
Potassium: 3.3 mmol/L — ABNORMAL LOW (ref 3.5–5.1)
Sodium: 136 mmol/L (ref 135–145)
Total Bilirubin: 0.8 mg/dL (ref 0.3–1.2)
Total Protein: 6.6 g/dL (ref 6.5–8.1)

## 2018-07-24 LAB — MAGNESIUM: Magnesium: 2 mg/dL (ref 1.7–2.4)

## 2018-07-24 LAB — CBC
HCT: 38.2 % — ABNORMAL LOW (ref 39.0–52.0)
Hemoglobin: 13.3 g/dL (ref 13.0–17.0)
MCH: 30.7 pg (ref 26.0–34.0)
MCHC: 34.8 g/dL (ref 30.0–36.0)
MCV: 88.2 fL (ref 80.0–100.0)
Platelets: 370 10*3/uL (ref 150–400)
RBC: 4.33 MIL/uL (ref 4.22–5.81)
RDW: 12.1 % (ref 11.5–15.5)
WBC: 8.4 10*3/uL (ref 4.0–10.5)
nRBC: 0 % (ref 0.0–0.2)

## 2018-07-24 LAB — TROPONIN I: Troponin I: <0.03 ng/mL (ref <0.03)

## 2018-07-24 MED ORDER — NALOXONE HCL 4 MG/0.1ML NA LIQD
1.0000 | Freq: Once | NASAL | Status: AC
  Administered 2018-07-24: 1 via NASAL
  Filled 2018-07-24: qty 4

## 2018-07-24 MED ORDER — SODIUM CHLORIDE 0.9% FLUSH
3.0000 mL | Freq: Once | INTRAVENOUS | Status: AC
  Administered 2018-07-24: 3 mL via INTRAVENOUS

## 2018-07-24 MED ORDER — POTASSIUM CHLORIDE CRYS ER 20 MEQ PO TBCR: 20.0000 meq | EXTENDED_RELEASE_TABLET | Freq: Two times a day (BID) | ORAL | 0 refills | Status: AC

## 2018-07-24 MED ORDER — POTASSIUM CHLORIDE CRYS ER 20 MEQ PO TBCR
40.0000 meq | EXTENDED_RELEASE_TABLET | Freq: Once | ORAL | Status: AC
  Administered 2018-07-24: 40 meq via ORAL
  Filled 2018-07-24: qty 2

## 2018-07-24 NOTE — ED Provider Notes (Signed)
[facility] DEPT Provider Note   CSN: [id number] Arrival date & time: 07/24/18  0507    History   Chief Complaint Chief Complaint  Patient presents with  . Chest Pain    HPI [name] is a 38 y.o. male with a history of heroin abuse, amphetamine abuse, cocaine abuse, hepatitis C, anxiety, anemia, and depression who presents to the emergency department with a chief complaint of chest pain.  The patient reports sudden onset left-sided chest pain that began approximately 45 minutes prior to arrival while he was injecting cocaine.  He reports the constant pain seems to radiate from his left chest to his left mid back.  Pain is sharp and stabbing.  He reports the pain was 10 out of 10 when it began, but has been gradually improving since onset.  No known aggravating or alleviating factors. He reports that he was diaphoretic earlier while using drugs, which is since resolved.  He reports that he also used heroin around the same time he was using cocaine.  He denies alcohol use, or other IV or recreational drug use tonight.  He reports that he did use methamphetamine approximately 4 days ago.  He reports that he overdosed on heroin at home 5 days ago and had to have Narcan administered.  He denies shortness of breath, palpitations, numbness, weakness, dizziness, lightheadedness, headache, cyanosis, leg swelling, nausea, vomiting, or abdominal pain.  No treatment prior to arrival.  He reports that he was clean for approximately 4 months, but began using 2 weeks ago.  He would not elaborate on any stressors or reasons for why he resumed drug use.     The history is provided by the patient. No language interpreter was used.  Chest Pain  Pain location:  L chest Pain quality: sharp and stabbing   Pain radiates to:  Mid back Pain severity:  Severe Onset quality:  Sudden Timing:  Constant Progression:  Improving Chronicity:  New Context: drug use    Context: not breathing   Relieved by:  Nothing Worsened by:  Nothing Ineffective treatments:  None tried Associated symptoms: no abdominal pain, no back pain, no fever, no headache and no shortness of breath     Past Medical History:  Diagnosis Date  . Anemia   . Anxiety   . Arthritis   . Depression   . Hepatitis C   . History of kidney stones   . Kidney stone   . Opiate dependence [facility])     Patient Active Problem List   Diagnosis Date Noted  . Cocaine abuse with cocaine-induced mood disorder ([place]) 03/18/2017  . 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 Polysubstance dependence including opioid type drug without complication, episodic abuse ([place]) 07/09/2016  . Encounter for well adult exam with abnormal findings 12/20/2015  . Bruises easily 12/20/2015  . Squamous cell carcinoma 05/19/2015  . Itching 05/19/2015  . Anxiety disorder 12/05/2014  . 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 APPENDECTOMY    . left axillary node biopsy    . RADICAL NECK DISSECTION Left   . TONSILLECTOMY          Home Medications    Prior to Admission medications   Medication Sig Start Date End Date Taking? Authorizing Provider  potassium chloride SA (K-DUR,KLOR-CON) 20 MEQ tablet Take 1 tablet (20 mEq total) by mouth 2 (two) times daily for 5 days. 07/24/18 07/29/18  , Maree Erie A, PA-C  QUEtiapine (SEROQUEL) 100 MG tablet Take 1 tablet (100 mg total) by mouth at bedtime. Patient taking differently: Take 200 mg by mouth at bedtime.  12/07/16   Money, Lowry Ram, FNP  sertraline (ZOLOFT) 100 MG tablet Take 150 mg by mouth daily.    [provider]    Family History Family History  Problem Relation Age of Onset  . Kidney cancer Father        renal cell carcenoma  . Hodgkin's lymphoma Mother   . Colon cancer Paternal Grandfather        great  . Lung cancer Paternal Grandmother   . Diabetes Maternal Grandmother   . Pancreatic  cancer Maternal Grandmother   . Melanoma Maternal Grandfather     Social History Social History   Tobacco Use  . Smoking status: Current Some Day Smoker    Packs/day: 0.50    Years: 9.00    Pack years: 4.50    Types: Cigarettes  . Smokeless tobacco: Former Systems developer    Types: Chew    Quit date: 04/16/2008  Substance Use Topics  . Alcohol use: Yes    Comment: "rarely"  . Drug use: Yes    Frequency: 7.0 times per week    Types: Marijuana, Methamphetamines, IV    Comment: meth and heroin use today     Allergies   Ceclor [cefaclor]   Review of Systems Review of Systems  Constitutional: Negative for appetite change and fever.  Respiratory: Negative for shortness of breath.   Cardiovascular: Positive for chest pain.  Gastrointestinal: Negative for abdominal pain.  Genitourinary: Negative for dysuria.  Musculoskeletal: Negative for back pain.  Skin: Negative for rash.  Allergic/Immunologic: Negative for immunocompromised state.  Neurological: Negative for headaches.  Psychiatric/Behavioral: Negative for confusion.    Physical Exam Updated Vital Signs BP 105/85 (BP Location: Right Arm)   Pulse 87   Temp 97.9 F (36.6 C) (Oral)   Resp 17   Ht 5' 8"  (1.727 m)   Wt 81.6 kg   SpO2 95%   BMI 27.37 kg/m   Physical Exam Vitals signs and nursing note reviewed.  Constitutional:      General: He is not in acute distress.    Appearance: He is well-developed. He is not ill-appearing, toxic-appearing or diaphoretic.  HENT:     Head: Normocephalic.  Eyes:     General: No scleral icterus.    Extraocular Movements: Extraocular movements intact.     Conjunctiva/sclera: Conjunctivae normal.     Pupils: Pupils are equal, round, and reactive to light.  Neck:     Musculoskeletal: Neck supple.  Cardiovascular:     Rate and Rhythm: Regular rhythm. Tachycardia present.     Pulses: Normal pulses.     Heart sounds: Normal heart sounds. No murmur. No friction rub. No gallop.       Comments: No murmurs rubs or gallops.  No reproducible tenderness palpation to the chest wall.  No crepitus. Pulmonary:     Effort: Pulmonary effort is normal. No respiratory distress.     Breath sounds: Normal breath sounds. No stridor. No wheezing, rhonchi or rales.  Chest:     Chest wall: No tenderness.  Abdominal:     General: There is no distension.     Palpations: Abdomen is soft. There is no mass.     Tenderness: There is no abdominal tenderness. There is no right CVA tenderness, left CVA tenderness, guarding or rebound.     Hernia: No hernia is present.  Skin:    General: Skin is warm and dry.     Capillary Refill: Capillary refill takes less than 2 seconds.     Comments: Track marks noted in the left upper extremity.  Scabs are noted over the patient's upper chest concerning for healing folliculitis versus skin picking.  No pustules, warmth, or edema.  Neurological:     Mental Status: He is alert.     Comments: GCS 15.  Alert and oriented x3.  Speaks in complete fluent sentences.  Psychiatric:        Behavior: Behavior normal.      ED Treatments / Results  Labs (all labs ordered are listed, but only abnormal results are displayed) Labs Reviewed  CBC - Abnormal; Notable for the following components:      Result Value   HCT 38.2 (*)    All other components within normal limits  COMPREHENSIVE METABOLIC PANEL - Abnormal; Notable for the following components:   Potassium 3.3 (*)    Calcium 8.6 (*)    AST 54 (*)    All other components within normal limits  TROPONIN I  MAGNESIUM    EKG EKG Interpretation  Date/Time:  Thursday July 24 2018 05:21:25 EDT Ventricular Rate:  111 PR Interval:    QRS Duration: 94 QT Interval:  359 QTC Calculation: 488 R Axis:   39 Text Interpretation:  Sinus tachycardia Borderline prolonged QT interval Rate faster than previous Confirmed by Pryor Curia [phone]) on 07/24/2018 5:38:38 AM   Radiology Dg Chest 2 View  Result Date:  07/24/2018 CLINICAL DATA:  Sudden onset chest pain after drug use EXAM: CHEST - 2 VIEW COMPARISON:  03/17/2017 FINDINGS: Normal heart size and mediastinal contours. No acute infiltrate or edema. No effusion or pneumothorax. No acute osseous findings. IMPRESSION: Negative chest. Electronically Signed   By: Monte Fantasia M.D.   On: 07/24/2018 06:24    Procedures Procedures (including critical care time)  Medications Ordered in ED Medications  sodium chloride flush (NS) 0.9 % injection 3 mL (3 mLs Intravenous Given 07/24/18 0545)  potassium chloride SA (K-DUR,KLOR-CON) CR tablet 40 mEq (40 mEq Oral Given 07/24/18 0638)  naloxone (NARCAN) nasal spray 4 mg/0.1 mL (1 spray Nasal Provided for home use 07/24/18 9518)     Initial Impression / Assessment and Plan / ED Course  I have reviewed the triage vital signs and the nursing notes.  Pertinent labs & imaging results that were available during my care of the patient were reviewed by me and considered in my medical decision making (see chart for details).        38 year old male with a history of heroin abuse, amphetamine abuse, cocaine abuse, hepatitis C, anxiety, anemia, and depression presenting with sudden onset left-sided chest pain that began while he was using IV cocaine and heroin.  The patient was discussed with Dr. Leonides Schanz, attending physician.  EKG with sinus tachycardia and borderline prolonged QT.  Chest x-ray is unremarkable.  Troponin is negative.  Potassium is 3.3.  He was given 20 mEq of oral potassium in the ER and will discharge with a 5-day course of oral potassium for home.  Magnesium is normal.  Labs are otherwise unremarkable.  On re-evaluation, chest pain has resolved without treatment.  He was tachycardic in the 120s on arrival and mildly hypertensive, which has also resolved without treatment.  Low suspicion for ACS, PE, or pneumothorax at this time.  The patient was given a naloxone kit for home use in the ER.  Peer support has been  consulted as the patient is requesting additional assistance with drug use.  He has been given outpatient resources.  Strict return precautions given.  He is hemodynamically stable in no acute distress.  Safe to discharge home with outpatient follow-up at this time.  Final Clinical Impressions(s) / ED Diagnoses   Final diagnoses:  Chest pain, unspecified type  Cocaine use disorder ([place])  Methamphetamine abuse ([place])  Heroin abuse [facility])    ED Discharge Orders         Ordered    potassium chloride SA (K-DUR,KLOR-CON) 20 MEQ tablet  2 times daily     07/24/18 0634           Joline Maxcy A, PA-C 07/24/18 [place], Delice Bison, DO 07/24/18 [phone]

## 2018-07-24 NOTE — Discharge Instructions (Addendum)
Thank you for allowing me to care for you today in the Emergency Department.  Stop using cocaine, meth, and heroin.  A peer support specialist will reach out to you after your ER visit today.  Community resources have been provided.  Take 1 tablet of potassium 2 times daily for the next 5 days.  You have been provided a kit of Narcan for home use in case you have an overdose. To use: lay patient on back and administer nasal spray into one nostril while supporting back of neck to allow head to tilt back.  Press firmly on device plunger.  May give additional dose into other nostril after 2-3 minutes if no response to first or patient relapses back to extreme somnolence or slow breathing.  Return to the emergency department for worsening chest pain, shortness of breath, if your lips turn blue, if you pass out, if you overdose, or other new concerning symptoms.

## 2018-07-24 NOTE — ED Triage Notes (Addendum)
Pt c/o left sided c/p after using cocaine & heroin approx 45 minutes ago.  Pt states "It's a sharp pain.  I was clean x 4 months."  Pt denies n/v.  Pt stated "I had sweating earlier when I had used the drugs."

## 2018-07-24 NOTE — ED Notes (Signed)
Pt sitting on end of bed leaning towards edge. Pt redirected to lie back in bed to keep from falling. Pt insistent on sitting on edge of bed rocking back and forth.

## 2018-12-27 IMAGING — CR DG CHEST 2V
2 series · 2 of 2 positions shown · non-contrast
Comparison: 06/06/2016

CLINICAL DATA: Cough.

EXAM:
CHEST  2 VIEW

[w chest pa]
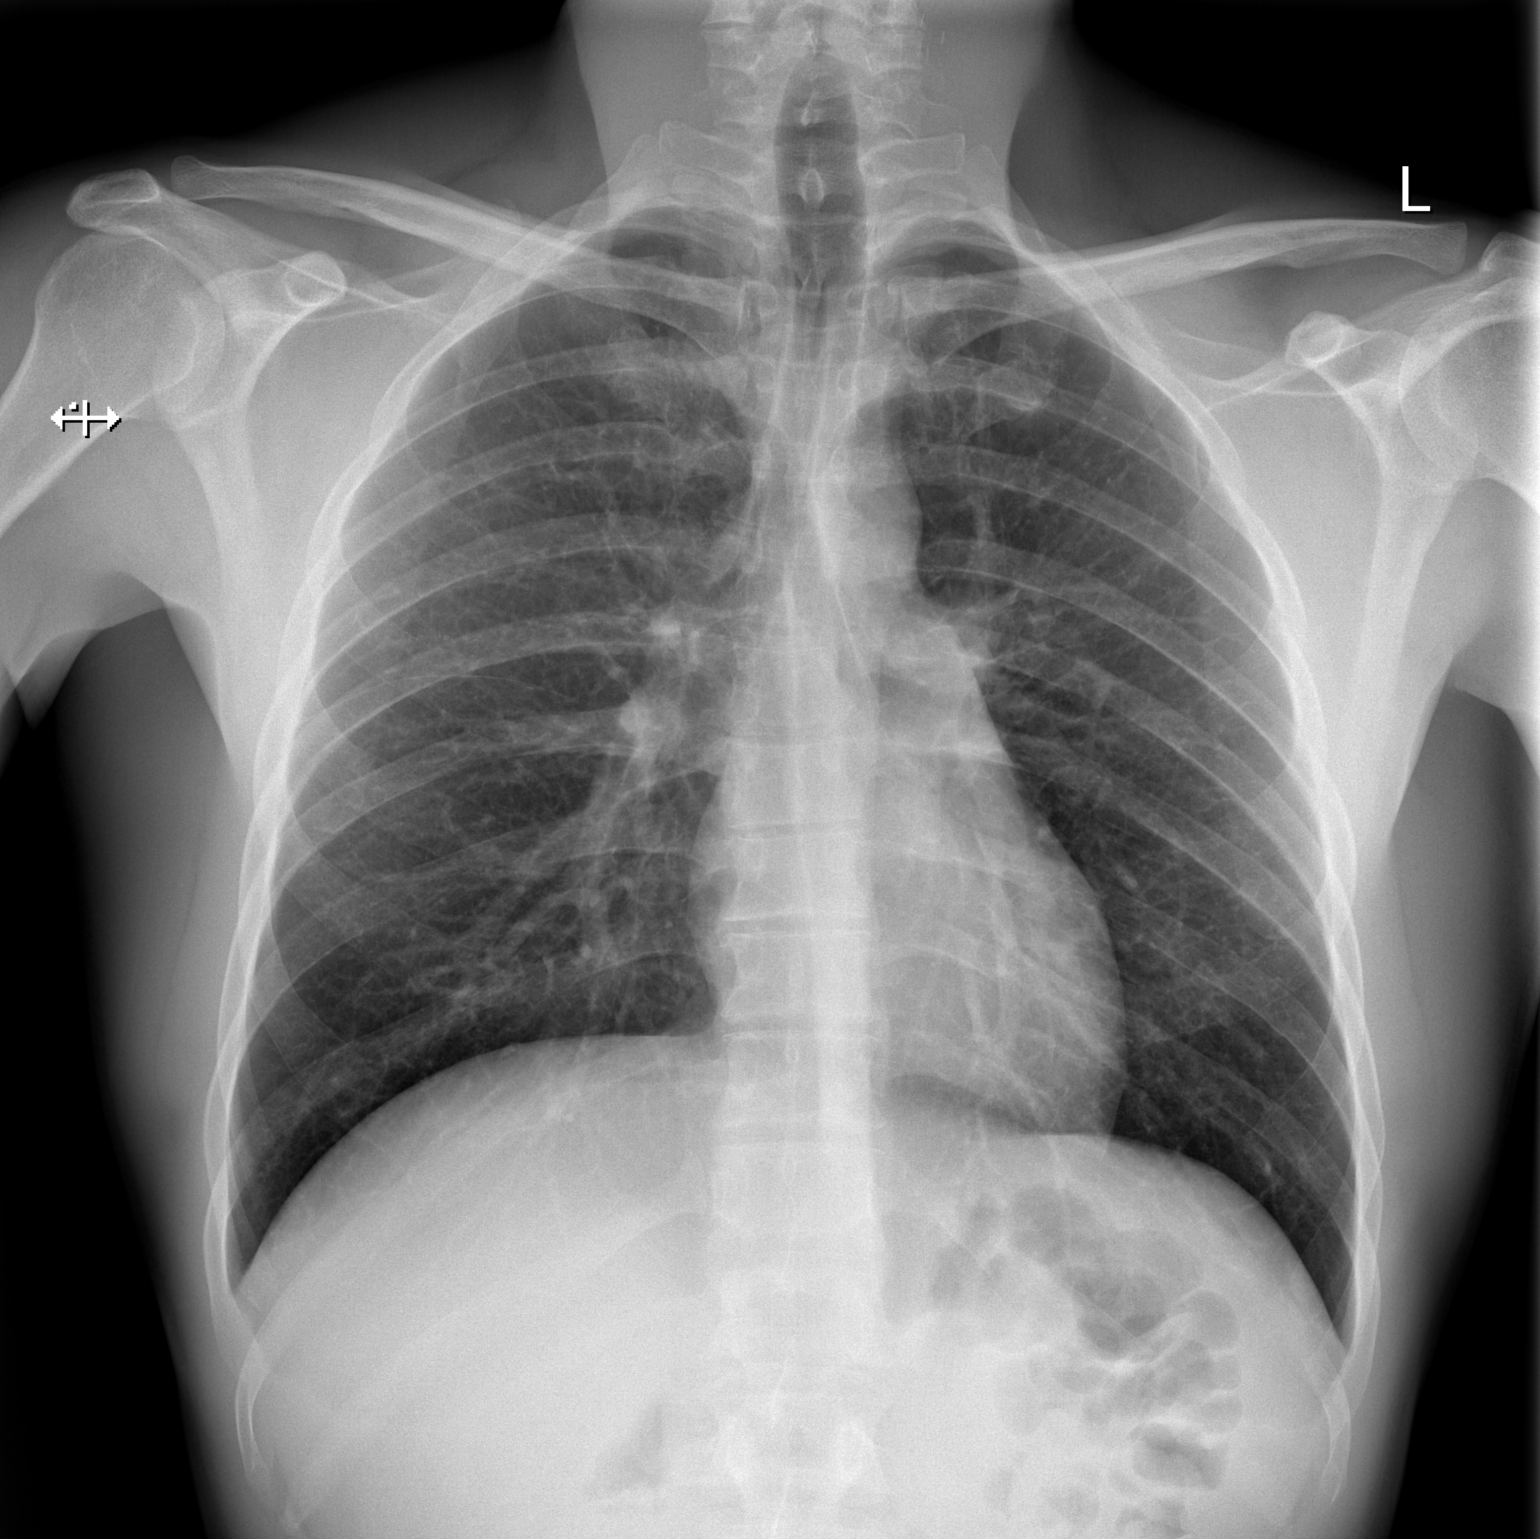

[w chest lat]
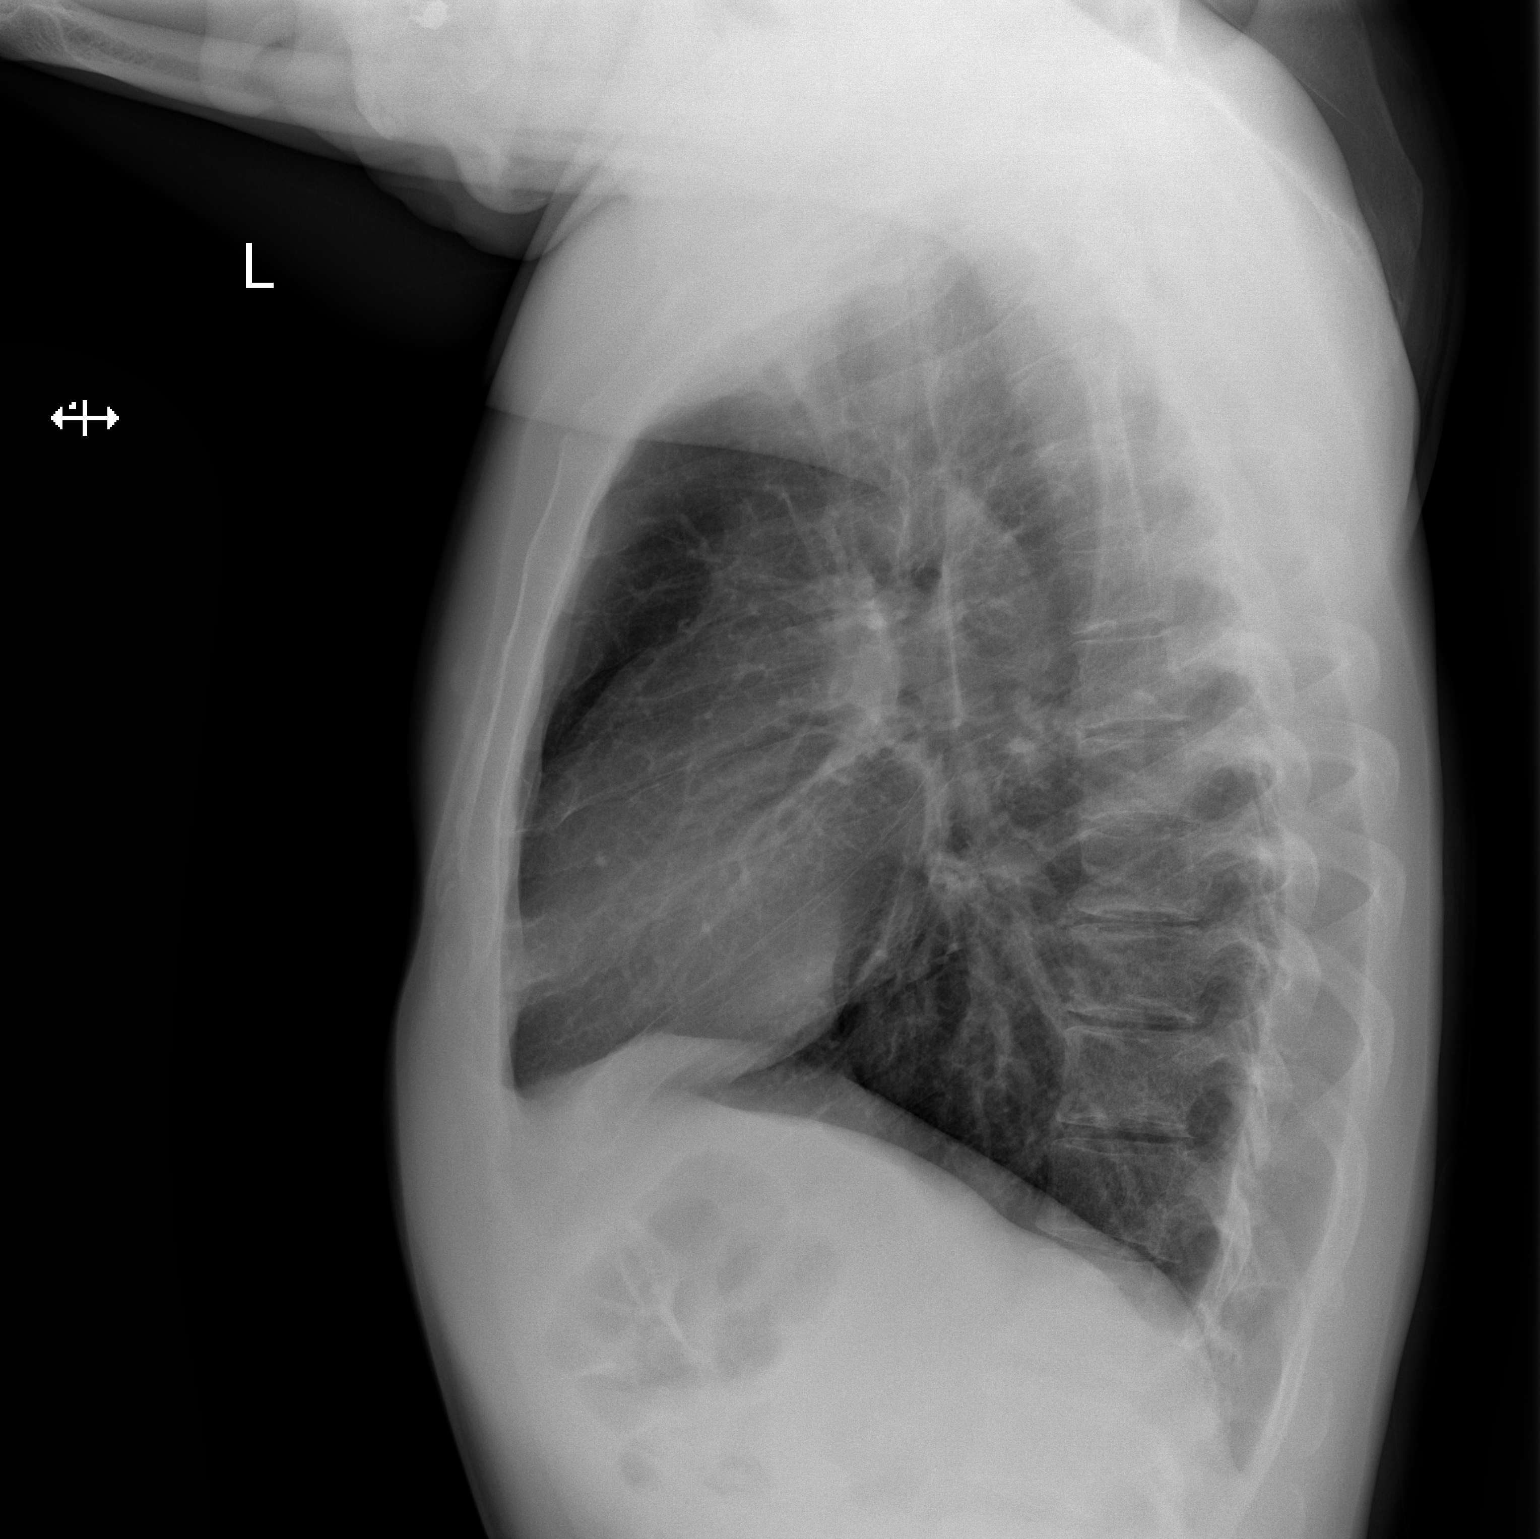

[2 of 2 positions shown; findings below may reference images not displayed]

FINDINGS: The lungs are clear without focal pneumonia, edema, pneumothorax or
pleural effusion. The cardiopericardial silhouette is within normal
limits for size. The visualized bony structures of the thorax are
intact. Telemetry leads overlie the chest.
IMPRESSION: No active cardiopulmonary disease.

## 2022-01-24 ENCOUNTER — Telehealth: Payer: Self-pay

## 2022-04-23 ENCOUNTER — Emergency Department
Admission: EM | Admit: 2022-04-23 | Discharge: 2022-04-23 | Disposition: A | Discharge status: Other Acute Inpatient Hospital | Payer: Self-pay | Attending: Student in an Organized Health Care Education/Training Program | Admitting: Student in an Organized Health Care Education/Training Program

## 2022-04-23 DIAGNOSIS — R45851 Suicidal ideations: Secondary | ICD-10-CM | POA: Insufficient documentation

## 2022-04-23 DIAGNOSIS — F17210 Nicotine dependence, cigarettes, uncomplicated: Secondary | ICD-10-CM | POA: Insufficient documentation

## 2022-04-23 DIAGNOSIS — F1410 Cocaine abuse, uncomplicated: Secondary | ICD-10-CM | POA: Insufficient documentation

## 2022-04-23 DIAGNOSIS — F5101 Primary insomnia: Secondary | ICD-10-CM

## 2022-04-23 DIAGNOSIS — Z1152 Encounter for screening for COVID-19: Secondary | ICD-10-CM | POA: Insufficient documentation

## 2022-04-23 DIAGNOSIS — F419 Anxiety disorder, unspecified: Secondary | ICD-10-CM

## 2022-04-23 DIAGNOSIS — F1110 Opioid abuse, uncomplicated: Secondary | ICD-10-CM | POA: Insufficient documentation

## 2022-04-23 HISTORY — DX: Unspecified convulsions (CMS-HCC): R56.9

## 2022-04-23 HISTORY — DX: Major depressive disorder, single episode, unspecified: F32.9

## 2022-04-23 HISTORY — DX: Anxiety disorder, unspecified: F41.9

## 2022-04-23 LAB — CBC WITH DIFF, BLOOD
ANC automated: 9.6 10*3/uL — ABNORMAL HIGH (ref 2.0–8.1)
Basophils %: 0.3 %
Basophils Absolute: 0 10*3/uL (ref 0.0–0.2)
Eosinophils %: 0.1 %
Eosinophils Absolute: 0 10*3/uL (ref 0.0–0.5)
Hematocrit: 42.9 % (ref 39.5–50.0)
Hgb: 14.8 G/DL (ref 13.5–16.9)
Lymphocytes %: 12.9 %
Lymphocytes Absolute: 1.5 10*3/uL (ref 0.9–3.3)
MCH: 30.8 PG (ref 27.0–33.5)
MCHC: 34.5 G/DL (ref 32.0–35.5)
MCV: 89.3 FL (ref 81.5–97.0)
MPV: 7.5 FL (ref 7.2–11.7)
Monocytes %: 4.9 %
Monocytes Absolute: 0.6 10*3/uL (ref 0.0–0.8)
Neutrophils % (A): 81.8 %
PLT Count: 384 10*3/uL (ref 150–400)
RBC: 4.81 10*6/uL (ref 4.38–5.62)
RDW-CV: 13 % (ref 11.6–14.4)
White Bld Cell Count: 11.7 10*3/uL — ABNORMAL HIGH (ref 4.0–10.5)

## 2022-04-23 LAB — COMPREHENSIVE METABOLIC PANEL, BLOOD
ALT: 10 U/L (ref 7–52)
AST: 14 U/L (ref 13–39)
Albumin: 5 G/DL (ref 4.2–5.5)
Alk Phos: 39 U/L (ref 34–104)
BUN: 13 mg/dL (ref 7–25)
Bilirubin, Total: 1 mg/dL (ref 0.0–1.4)
CO2: 28 mmol/L (ref 21–31)
Calcium: 9.9 mg/dL (ref 8.6–10.3)
Chloride: 102 mmol/L (ref 98–107)
Creat: 0.9 mg/dL (ref 0.7–1.3)
Electrolyte Balance: 6 mmol/L (ref 2–12)
Glucose: 129 mg/dL — ABNORMAL HIGH (ref 70–115)
Potassium: 3.9 mmol/L (ref 3.5–5.1)
Protein, Total: 7.4 G/DL (ref 6.0–8.3)
Sodium: 136 mmol/L (ref 136–145)
eGFR - high estimate: >60 (ref >59)
eGFR - low estimate: >60 (ref >59)

## 2022-04-23 LAB — URINALYSIS
Bilirubin, UA: NEGATIVE
Glucose, UA: 50 MG/DL — AB
Hemoglobin, UA: NEGATIVE
Ketones, UA: 40 MG/DL — AB
Leukocyte Esterase, UA: NEGATIVE
Nitrite, UA: NEGATIVE
Protein, UA: 50 MG/DL — AB
RBC, UA: 2 #/HPF (ref 0–3)
Specific Grav, UA: 1.025 (ref 1.003–1.030)
Urobilinogen, UA: <2 MG/DL (ref <2)
WBC, UA: 3 #/HPF (ref 0–5)
pH, UA: 5.5 (ref 5.0–8.0)

## 2022-04-23 LAB — DRUG SCREEN RAPID PANEL 12 NO CONFIRMATION, URINE
Amphetamines, Urine: NOT DETECTED
Barbiturates: NOT DETECTED
Benzodiazepines: NOT DETECTED
Cocaine: POSITIVE — AB
Fentanyl,  U Scrn: POSITIVE — AB
MDMA: NOT DETECTED
Methadone: NOT DETECTED
Opiates: NOT DETECTED
Oxycodone Lvl, UR: NOT DETECTED
PCP: NOT DETECTED
Propoxyphene: NOT DETECTED
THC: NOT DETECTED

## 2022-04-23 LAB — COVID/FLU A B/RSV PANEL
COVID-19 Result: NOT DETECTED
Influenza A, PCR: NOT DETECTED
Influenza B, PCR: NOT DETECTED
Respiratory Syncytial Virus PCR: NOT DETECTED

## 2022-04-23 LAB — TROPONIN I, HIGH SENSITIVITY: Troponin I, High Sensitivity: 6 ng/L (ref 0–20)

## 2022-04-23 MED ORDER — LORAZEPAM 1 MG OR TABS
1.0000 mg | ORAL_TABLET | Freq: Once | ORAL | Status: AC
Start: 2022-04-23 — End: 2022-04-23
  Administered 2022-04-23: 1 mg via ORAL
  Filled 2022-04-23: qty 1

## 2022-04-23 MED ORDER — QUETIAPINE FUMARATE 50 MG OR TABS
50.0000 mg | ORAL_TABLET | Freq: Three times a day (TID) | ORAL | Status: DC | PRN
Start: 2022-04-23 — End: 2022-04-24

## 2022-04-23 MED ORDER — HYDROXYZINE HCL 25 MG OR TABS
50.0000 mg | ORAL_TABLET | ORAL | Status: DC | PRN
Start: 2022-04-23 — End: 2022-04-24
  Administered 2022-04-23: 50 mg via ORAL
  Filled 2022-04-23: qty 2

## 2022-04-23 MED ORDER — QUETIAPINE FUMARATE 50 MG OR TABS
50.0000 mg | ORAL_TABLET | Freq: Every evening | ORAL | Status: DC
Start: 2022-04-23 — End: 2022-04-24
  Administered 2022-04-23: 50 mg via ORAL
  Filled 2022-04-23: qty 1

## 2022-04-23 NOTE — ED Notes (Signed)
Lunch tray provided. 

## 2022-04-23 NOTE — Interdisciplinary (Signed)
Substance Use Navigator (SUN) Katie met with patient at bedside, introduced self, roles, and reason for visit. Patient Victor Huffman reports six months of sobriety until recent relapse on crack cocaine and fentanyl one week ago. Patient declines feeling any opioid withdrawal symptoms. Patient stated, "When I relapse I typically go until I have ruined everything, but I don't want to do that this time, so I decided to come here."     Patient open to attending rehab, however rehab options are limited due to patient's lack of health insurance. Due to patient's impending inpatient psych transfer, SUN provided SUN's contact information in patient's AVS for assistance with rehab placement post-discharge.     SUN provided naloxone kit to patient, placed with patient's belongings.     Arman Bogus  Substance Use Navigator  [phone]

## 2022-04-23 NOTE — Discharge Instructions (Signed)
For substance use navigation services, please call Katie at [facility] at [phone].

## 2022-04-23 NOTE — Interdisciplinary (Addendum)
04/23/22 2020   Assessment   Assessment Type Progress/Follow-up;Discharge;Verbal   OTHER   Inpatient Psych status Pending     Per chart review, pt continues to require inpatient psych placement. Pt is on a voluntary hold. BL3 RN Marlowe Sax notified CSW re: [facility] requesting for updated vitals to be faxed. CSW faxed pts clinicals via [place]. RN Marlowe Sax made aware.     @2126 : CSW received call from [facility], confirmed pt accepted to [facility]. Accepting MD is Dr. Thornell Mule. Pt aggreable for transfer. Acupuncturist completed. CSW called [facility], [phone], confirmed pick up eta @2200 -2230. BL3 RN Gerald Stabs made aware.     D/c dispo: pending transfer to [place], MSW  Clinical Social Worker II  Office: [phone]

## 2022-04-23 NOTE — ED Notes (Signed)
Dinner tray provided

## 2022-04-23 NOTE — ED Provider Notes (Signed)
CHIEF COMPLAINT:   Psychiatric Evaluation (Relapsed on cocaine & crack x1 week ago, was sober x6 months. +SI, plan to hang self. )     HISTORY OF PRESENT ILLNESS:   Interpreter used: No (English Preferred Language)    Victor Huffman is a 42 year old male who presents with presents with + SI, plan to hurt himself via hanging. Has recently restarted on cocaine and crack. + hearing voices. History of MDD and anxiety in the past. Normally manages well but not recently. Restarted on drugs which he believes to be contributory to symptoms. No current significant drinking. Reports some feeling of palpitations.     Denies current physical symptoms, in normal state of health, specifically denies chest pain, difficulty breathing, recent ingestions, nausea/vomiting/diarrhea, side effects to current medications. Used crack or fentanyl this AM.    {Only select if an additional historian was used (Optional):444500090}    {If your patient was unable to give a complete history, use ".UTO" :23999}  {SnapShot  Medical & Surgical History  Chart Review  Results Review :23999}    PAST MEDICAL HISTORY:  Past Medical History:   Diagnosis Date    Anxiety     Major depressive disorder, single episode     Seizures (CMS-HCC)       There are no problems to display for this patient.    SURGICAL HISTORY:  No past surgical history on file.     ALLERGIES:  Allergies   Allergen Reactions    Ceclor [Cefaclor] Unspecified     Unspecified        FAMILY HISTORY:  Reviewed and considered non-contributory     SOCIAL HISTORY/DETERMINANTS OF HEALTH:  Social History     Tobacco Use    Smoking status: Every Day     Types: Cigarettes    Smokeless tobacco: Never   Substance and Sexual Activity    Alcohol use: Not Currently    Drug use: Yes     Types: Opiates (Morphine/Heroin/Oxycodone/Hydrocodone/Fentanyl), Stimulants (Cocaine/Methamphetamine), IV      VITAL SIGNS:  First Vitals [04/23/22 1016]   Temperature Heart Rate Respirations Blood pressure  (BP) SpO2   98 F (36.7 C) 101 20 (!) 150/103 100 %     PHYSICAL EXAM:  General: Awake, alert, in no apparent distress.  Head: Normocephalic, atraumatic.  Respiratory: Breathing comfortably and in no respiratory distress. No audible wheezing or stridor.   Cardiovascular: Extremities are warm and well perfused.  Neuro: Awake and alert. Moving extremities.  Skin: - rashes, abscesses, or lacerations  Psych: + SI, - HI, - AVH  Physical Exam   {Please use the dotphrase .EDEXAMLIST to reload the smartlist option. :23999}    SMART Medical Clearance:   (If all answers are NO, then patient is medically stable for psychiatry evaluation)    Suspect New Onset Psychiatric Condition?: -    Medical Screening:  -Pregnant: -  -Diabetes with Fasting Blood Sugar <60 or >250: -    Abnormal?:  -Vital Signs Unstable? -  -Alert and oriented < 3? -  -Clinically Intoxicated? -    Risky Presentation?:  -Age (<12 or >55): -  -Toxic Ingestion: -  -Eating Disorder: -  -Daily Alcohol Use for at least 2 weeks (high potential for alcohol withdrawal): -  -Ill-Appearing, Significant Injury, Found Down: -    Therapeutic Levels Needed?:  -Phenytoin (Dilantin): -  -Warfarin (Coumadin): -  -Valproic Acid (Depakote): --  -Lithium: -  -Digoxin: -     { If patient  is MODERATE risk based on [place] Screening AND [facility] is not available to do a SAFE-T evaluation, use ".SAFETUCI" in your note. :23999}    MEDICAL DECISION MAKING:  {Chart Review  Lab Results  Radiology Report  Scoring Tools  Workup Summary  Disposition Link  Consult Report :23999}  Initial Impressions:  Victor Huffman is a 42 year old male who presents with suicidal thoughts in setting of recently starting drugs.   Vital signs and physical exam were notable for: afebrile, not hypoxic or tachycardic, normal respiratory rate, normal physical exam    Differential diagnosis includes acute exacerbation of existing psychotic disorder, schizophrenia, depression, and toxic  ingestion.    Workup Review:  Given psychiatric history, age, medical history and lack of somatic complaints this is likely an acute exacerbation of a chronic psychiatric problem. Doubt organic etiology. Will defer lab work.      ***    Disposition Decision:  {[place] ED MDM Dispo Logic:36888:::1}  {[place] ED MDM Hospitalization Decision (Optional):444500088}    {If you need to re-launch the MDM tools use .HELPEDPSYCHMDM :23999}     { This will not display in your note. Reference material for MDM best practices.  Initial Impressions - exam, vitals, initial differential. Pertinent recent history  Workup Review - YOUR interpretation of labs and imaging, chronic conditions considered, EKG  Disposition decision - social determinants of health, why you did or DID NOT do something, using scoring tools. Ultimately "why" you chose the disposition. :23999}    The following work up was performed during the encounter. Orders placed after a disposition has been selected will not be listed here. A complete account of orders should be referenced elsewhere:     ED Orders (From admission, onward)      None             ED COURSE/PATIENT REASSESSMENTS:  "ED Course" is listed below that provides real time documentation during ER encounter and will provide further context to the MDM discussion above.    Workup Summary       There is no data filed.              DIAGNOSIS:{Disposition Link Add multiple impressions from visit and history. :23999}  No diagnosis found.    *** This patient is missing an ED Clinical Impression for this visit. Please document from either the Storyboard or Dispo tab and refresh your note.    {If you believe this patient required critical care, please use .CCT Attending must list time  :23999}  {Use .UCIATTEST to pick the correct medical student/resident attestation. [phone]

## 2022-04-23 NOTE — Consults (Signed)
Department of Psychiatry and Human Behavior  Initial Consultation Note      Request for Consultation: Asked by Boneta Lucks, MD to evaluate this patient for SI with plan to hang self.    History of Present Illness:   Victor Huffman is a 42 year old male with previous history of depression, anxiety, cocaine use disorder, and fentanyl use disorder who presents voluntarily brought in by self with SI with plan to self.  UDS positive for cocaine and fentanyl.    Patient presents with thoughts of suicide with a plan to hang himself in the context of recent drug relapse.  He states that he is "not having a good day" because he was sober for 6 years and recently relapsed on drugs about a week ago in the context of psychosocial stressors including missing his 27-year-old daughter who lives in [place] with his mom and his sister, and he also states that his daughter's mom has been struggling financially and that has been stressful to him as well.  He states that he recently used cocaine and crack this morning, and fentanyl few days ago.  He said he has used 3000 dollars over the past week to purchase drugs and this morning he woke up feeling worthless and guilty about his relapse which is when he started thinking about hanging himself and just giving up on life.  Throughout the interview patient is scratching his arms and legs and fidgeting and pacing around the room saying self-deprecating things like "I know I am wasting your guys' time", "I'm such a fuck up, I don't know why I did this" "I don't even know if I deserve the help."    Patient denies hearing voices, seeing things that are not there, and thoughts of hurting others.  When asked if he has ever felt like people are watching him or out to get him he laughs and says, "yes when I'm smoking crack." He denies feeling paranoid when he is sober.    Psychiatric ROS all negative unless otherwise discussed as above in the HPI     Collateral: Not provided    Suicide Assessment Five-step Evaluation and Triage (SAFE-T):    1. RISK FACTORS  Current/Past Psychiatric Diagnosis  Mood Disorder  Alcohol/Substance Use Disorder    Key Symptoms: Impulsivity, Hopelessness or despair, Anxiety and/or panic  Family History: No family history of death by suicide, attempted suicide, or psychiatric diagnoses requiring hospitalization  Precipitants/Stressors: Substance intoxication or withdrawal, Perceived burden on others  Change in Treatment: No known recent changes in provider or treatment  Access to Firearms: No    2. PROTECTIVE FACTORS   Internal Protective Factors: No identified internal protective factors  External Protective Factors: Supportive social network of family or friends, Engaged in work or school    3a. SUICIDE INQUIRY   Does the Patient Have Any Suicidal Ideation, Plans OR Intent:  Yes  Suicidal Ideation:     Frequency:  Many times each day    Intensity:  Unable to control thoughts    Duration:  More than 8 hours/persistent or continuous  Suicidal Plan:     Timing:  NO timeframe planned    Location:  NO location planned    Method:  Hanging    Availability:  Does NOT have access to lethal means    Preparation:  Has NOT made any preparations  Suicidal Intent:     Intention/Plan:  Patient is AMBIVALENT towards carrying out plan to die by suicide  Belief:  Patient believes the plan/act to be lethal  Behaviors:     Patient has had:  Non-suicidal self-injurious actions    3b. HOMICIDE INQUIRY   Homicidal:  No    4. RISK LEVEL/INTERVENTION   Risk/Protective Factors:  Multiple risk factors, few protective factors  Suicidality:  Suicidal ideation with plan, but no intent or behavior              MODERATE ACUTE RISK    5. DOCUMENTATION/INTERVENTION   See Recommendations section at bottom of this note        Non-psychiatric Review of Systems: Patient denies nausea, light-headedness. Review of Systems - All others negative     Psychiatric History:   Diagnoses and Course of  Illness(es):  Depression, anxiety, and multiple substance use disorders  Hospitalizations:  3 hospitalizations in [place] when he was younger for suicidal ideation, major depression, and cutting when he was a kid  Medication Trials: Viibryd -patient states this medication was very helpful for him in the past however his insurance stopped covering it and so he has been off of it for about a year  Current mental health providers:  Denies    Substance Use:   Tobacco:  Denies  Alcohol:  Last drink last night, 1 shot of alcohol  Cannabis:  Denies  Other Drugs:  Endorses use of cocaine, crack, and fentanyl  History of substance abuse treatment: was recently sober for 6 years, relapsed 1 week ago    Medications Prior to Admission:  No current facility-administered medications on file prior to encounter.     No current outpatient medications on file prior to encounter.       Current Hospital Medications:    Current Facility-Administered Medications:     hydrOXYzine HCL (ATARAX) tablet 50 mg, 50 mg, Oral, Q4H PRN, Exie Parody, MD  No current outpatient medications on file.    Allergies:   Allergies as of 04/23/2022 - Verified 04/23/2022   Allergen Reaction Noted    Ceclor [cefaclor] Unspecified 04/23/2022         Past Medical History and Past Surgical History:   Past Medical History:   Diagnosis Date    Anxiety     Major depressive disorder, single episode     Seizures (CMS-HCC)       No past surgical history on file.      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    Psychiatric Illness:  Mom with schizophrenia  Suicide:  Denies   Substance Abuse:  Multiple family members including his mom, his brother, his maternal aunt, maternal  uncle, have all struggled with use of "hard drugs"    Most Recent Vital Signs:   Vitals:    04/23/22 1016 04/23/22 1115   BP: (!) 150/103 (!) 149/99   BP Location:  Right arm   BP Patient Position:  Sitting   Pulse: 101 108   Resp: 20 22   Temp: 98 F (36.7 C)    SpO2: 100% 100%   Weight: 79.4 kg (175 lb)    Height: 5\' 8"  (1.727 m)        Mental Status Exam:   Appearance:  Dress/Hygiene: clean and neat     Attitude/Cooperation: collaborative/engaged  Psychomotor:      Eye Contact: intermittent gaze and pupillary dilation  Expressive Speech:      Rate: fast        Volume/Tone/Prosody: conversational level       Fluency: even     Posture: erect        Activity: increased, fidgeting, pacing and restless     Extrapyramidal symptoms: none observed/displayed     Catatonia: none observed/displayed     Mannerisms/Gestures: repeatedly scratching arms and legs.  Mood:      Inquired emotion: "not having a good day"     Mood Observed: dysthymic  Affect:      Observed: worried and disgusted     Relationship to Mood: mood-congruent     Consistency/Amplitude: even  Thought:     Form/Order: linear and racing     Suicidality: suicidal thoughts, suicidal plan and denies suicidal intent     Homicidality: denies homicidal thoughts     Deliberate self harm: denies     Perceptions: denies altered perceptions     Reality testing: intact  Cognition:     Orientation: person, place, time and purpose     Attention: intact for session     Insight/awareness into Illness/Symptoms: moderate     Judgement: impaired.        Laboratory Studies (last 24 hours):   Recent Results (from the past 24 hour(s))   Troponin I, High Sensitivity Light Green    Collection Time: 04/23/22 11:15 AM   Result Value Ref Range    Troponin I, High Sensitivity 6 0 - 20 ng/L   Comprehensive Metabolic Panel    Collection Time: 04/23/22 11:15 AM   Result Value Ref Range    Sodium 136 136 - 145 mmol/L    Potassium 3.9 3.5 - 5.1 mmol/L    Chloride 102 98 - 107 mmol/L     CO2 28 21 - 31 mmol/L    Electrolyte Balance 6 2 - 12 mmol/L    Glucose 129 (H) 70 - 115 mg/dL    BUN 13 7 - 25 mg/dL    Creat 0.9 0.7 - 1.3 mg/dL    eGFR - low estimate >60 >59    eGFR - high estimate >60 >59    Calcium 9.9 8.6 - 10.3 mg/dL    Protein, Total 7.4 6.0 - 8.3 G/DL    Albumin 5.0 4.2 - 5.5 G/DL    Alk Phos 39 34 - 481 U/L    AST 14 13 - 39 U/L    ALT 10 7 - 52 U/L    Bilirubin, Total 1.0 0.0 - 1.4 mg/dL   CBC w/ Diff [place]    Collection Time: 04/23/22 11:15 AM   Result Value Ref Range    White Bld Cell Count 11.7 (H) 4.0 - 10.5 THOUS/MCL    RBC 4.81 4.38 - 5.62 MILL/MCL    Hgb 14.8 13.5 - 16.9 G/DL    Hematocrit 85.6 31.4 - 50.0 %    MCV 89.3 81.5 - 97.0 FL    MCH 30.8 27.0 - 33.5 PG    MCHC 34.5 32.0 - 35.5 G/DL    RDW-CV 97.0 26.3 - 78.5 %    PLT Count 384 150 - 400 THOUS/MCL    MPV 7.5 7.2 - 11.7 FL    Diff Type DIFFERENTIAL PERFORMED BY AUTOMATED ANALYSIS     Neutrophils % (A) 81.8 %  ANC automated 9.6 (H) 2.0 - 8.1 THOUS/MCL    Lymphocytes % 12.9 %    Lymphocytes Absolute 1.5 0.9 - 3.3 THOUS/MCL    Monocytes % 4.9 %    Monocytes Absolute 0.6 0.0 - 0.8 THOUS/MCL    Eosinophils % 0.1 %    Eosinophils Absolute 0.0 0.0 - 0.5 THOUS/MCL    Basophils % 0.3 %    Basophils Absolute 0.0 0.0 - 0.2 THOUS/MCL   Urinalysis    Collection Time: 04/23/22 11:15 AM   Result Value Ref Range    UR Sample Site, UA URINE,TYPE NOT SPECIFIED     Color, UA YELLOW     Clarity CLEAR     Specific Grav, UA 1.025 1.003 - 1.030    pH, UA 5.5 5.0 - 8.0    Protein, UA 50 (A) NEGATIVE MG/DL    Glucose, UA 50 (A) NEGATIVE MG/DL    Ketones, UA 40 (A) NEGATIVE MG/DL    Bilirubin, UA NEGATIVE NEGATIVE    Hemoglobin, UA NEGATIVE NEGATIVE    Leukocyte Esterase, UA NEGATIVE NEGATIVE    Nitrite, UA NEGATIVE NEGATIVE    Urobilinogen, UA <2 <2 MG/DL    RBC, UA 2 0 - 3 #/HPF    WBC, UA 3 0 - 5 #/HPF    WBC Clumps, UA NONE NONE #/HPF    Bacteria, UA NONE NONE    Squamous Epithelial, UA NONE 0 - 5 /HPF    Mucous, UA MANY (A) NONE /LPF    Drug Screen Rapid Panel 12 No Confirmation, Urine    Collection Time: 04/23/22 11:15 AM   Result Value Ref Range    Drug Screen Type       Screening results are presumptive and should be interpreted with caution in conjunction with other clinical information.    Opiates UNDETECTED UNDETECTED    Cocaine POS SCREEN (A) UNDETECTED    PCP UNDETECTED UNDETECTED    Amphetamines, Urine UNDETECTED UNDETECTED    THC UNDETECTED UNDETECTED    Methadone UNDETECTED UNDETECTED    Propoxyphene UNDETECTED UNDETECTED    Benzodiazepines UNDETECTED UNDETECTED    Barbiturates UNDETECTED UNDETECTED    MDMA UNDETECTED UNDETECTED    Oxycodone Lvl, UR UNDETECTED UNDETECTED    Fentanyl,  U Scrn POS SCREEN (A) UNDETECTED    Drug Screen Cutoff Concentration Drugs Covered and Cutoff Concentration:        UDS/Pregnancy (if applicable): No results found for: "UDS", "PREG"    Assessment & Plan   Cullin Dishman is a 42 year old male with previous history of depression, anxiety, cocaine use disorder, and fentanyl use disorder who presents voluntarily brought in by self with SI with plan to self.  UDS positive for cocaine and fentanyl.    Patient was recently sober for the past 6 years however over the past week he relapsed on multiple substances including cocaine, crack, and fentanyl.  Patient is currently intoxicated as evidenced by his high anxiety, pacing around the room, racing thoughts, fidgeting, and scratching of his arms and legs  Patient's evaluation was fraught with self-deprecating language stating that he can not believe he broke his sobriety and that he knows he is just causing everyone problems and he knows that if he continues to use he is going to kill himself.  The patient denies wanting to hurt himself while in the hospital but does not feel safe leaving and thinks that he would continue to use drugs and then eventually kill himself if he were to  leave.  His recent drug relapse is in the context of social stressors,  specifically his daughter's mom has been struggling financially and he misses his 29-year-old daughter who lives in [place] with his mom and his sister.  He feels that his recent relapse has not only let himself down but his family as well and he wants to get sober again so that he can be a good father.  Patient's presentation is most consistent with multiple substance use disorders as well as likely substance induced mood disorder versus exacerbation of underlying depressive and/or anxiety disorder.    Pt is currently DTS and would benefit from inpatient psychiatric hospitalization for stabilization, medication management, and optimization.      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 Safety   -- LPS/Legal Status: Patient is amenable to voluntary admission. Patient should be evaluated for a legal hold if requesting to leave.  -- Monitoring:  Patient does not require 1:1 continuous direct observation.   2. Psychiatric Medication Management:  -- Start seroquel 50 mg nightly for insomnia  -- Start seroquel 50 mg q8h PRN anxiety/agitation  -- Start hydroxyzine 50 mg q4h PRN anxiety  3. Medical Issues: The Emergency Medicine team is addressing the following medical issue(s):  -- none acute  4. Disposition: Patient will be stabilized in the emergency department as we work with discharge planning to find appropriate inpatient psychiatric placement.  -- Follow Up: TBD  ----------------------------------------------------------------------------------------------------------------------  If patient is accepted/admitted to [facility],  please see hand-off for additional notes       The above case was seen, discussed, and care plan developed with attending Dr. Lavenia Atlas who agrees with the above assessment and recommendations. Thank you for including [place] in the multidisciplinary care of this patient.    Meyer Russel, MD, PhD  Resident Physician, PGY1

## 2022-04-23 NOTE — ED Notes (Addendum)
Handoff report to [place], EMT ([facility]) for transport to [facility].  Valuables envelope and patient's belongings bag given to EMT to be transferred with patient.  Patient is A&Ox4, calm and cooperative. Respirations even and unlabored, no s/sx distress. Skin warm and dry. Ambulatory with steady independent gait.

## 2022-04-23 NOTE — Interdisciplinary (Signed)
Social Work Assessment        Patient Name:  Victor Huffman   MRN: [id number]   Date of Birth: Dec 06, 1980    Age: 42 year old   Date of Admission:  04/23/2022         Service Date: April 23, 2022     Assessment  Assessment Type: Initial;Verbal    Referral Information  Referral Type: Discharge Planning;Verbal   Social Assessment  Where was the patient admitted from? *: Home  Mode of Arrival: Other (Comment) (BIB self)  Prior to Level of Function *: Independent with ADL's  Assistive Device *: Not applicable  Primary Caretaker(s) *: Self  Primary Family/Caregiver Contact Name, Number and Relationship *: [name] (Mother) [phone]  Permission to Contact *: Not Applicable  Is Patient Minor?: No  Interpreter Used?: Not Needed  Education: Not a Student  Living Arrangements on Admission*: Other (Resident at sober living home)  Post Acute Services Referred To: Inpatient Psych  Post Acute Resources Provided: Behavioral Health  A List of Community Resources and/or Shelters Provided: Not Applicable  Available Assistance/Support System *: Parent  Type of Residence *: Other (Comment) (Sober living home)  Home Care Services *: No  Additional Services on Admission: Not Applicable  Has discharge transport been arranged?: No  Patient Engaged in Discharge Planning *: No  Family/Caregiver's Assessed for *: Not Applicable  Respite Care *: Not Applicable  Patient/Family/Other Are In Agreement With Discharge Plan *: To be determined  Primary Care Access: None  Involvement with Law: None     Social Determinants of Health  Living Arrangements on Admission*: Other (Resident at sober living home)  Post Acute Services Referred To: Inpatient Psych  Post Acute Resources Provided: Behavioral Health  A List of Community Resources and/or Shelters Provided: Not Applicable  Available Assistance/Support System *: Parent  Type of Residence *: Other (Comment) (Sober living home)  Home Care Services *: No  Additional Services on  Admission: Not Applicable  Has discharge transport been arranged?: No  Patient Engaged in Discharge Planning *: No  Family/Caregiver's Assessed for *: Not Applicable  Respite Care *: Not Applicable  Patient/Family/Other Are In Agreement With Discharge Plan *: To be determined  Primary Care Access: None  Involvement with Law: None    Income Information  Income Source: Employed  Financial Resources: Other (Comment) (Unfunded)  Military History: Not Applicable    Mental Health Assessment  Past Mental Health Issues: Depression, Anxiety, Cocaine use disorder, fentanyl use disorder  Past Neglect or Abuse re-assessed at discharged: No reported past neglect or abuse  Notify Treatment Team to Assess Patient's Capacity?: No concerns at this time    Substance Abuse History ([facility]-AID)  Substance Abuse History: Hx of ETOH, cocaine, crack, and fentanyl use; UDS + cocaine/fentanyl; Relapsed 1 week ago after a 6 year sobriety    Referral To  Substance Abuse Referral: Your next level of care provider will manage your addictions treatment    Plans/Interventions/Discharge  Plan/Interventions: Explore needs and options for aftercare, provide referrals  Anticipated Discharge Destination: Unable to determine at this time  Discharge Resources Given: TBD  Barriers to Discharge *: Clinical reason;Behavioral concerns    Education/Resources  Audit Screening Initiated/Completed with in 24 hrs of Admission: Patient unable to participate at this time       Pt is a 42 y/o male with hx of depression, anxiety, cocaine use disorder, and fentanyl use disorder that was BIB self for SI w/ plan. Per Psych recommendations,  pt meets criteria for inpatient psych hospitalization (Voluntary). Currently no adult beds available at [facility] inpatient unit. Pt listed as unfunded for insurance. Pt referred to the following facilities for psych placement:       [facility] (Tel: [phone], Fax: [phone]); Clinicals sent via [place].   [facility]  (Tel: [phone], Fax: [phone]); Clinicals sent via [place].   [facility] and [place] locations) (Tel: [phone]; Fax: [phone]); Clinicals sent via [place].   [facility] (Tel: [phone]; Fax: [phone]); s/w Sharyn Lull, no beds available.   [facility] (Tel: [phone]; Fax: [phone]); s/w Larene Beach, no beds available.   [facility] (Tel: [phone]; Fax: [phone]); CSW left voicemail requesting a call back.   [facility] (Tel: [phone]; Fax: [phone]); s/w Kathlee Nations, beds available. Clinicals sent via [place].     Sinclair Grooms, LCSW  Clinical Social Worker III  Office: [phone]  Cell: [phone]  Pager: [phone]

## 2022-04-23 NOTE — ED Notes (Addendum)
Assumed care at this time. Pt awake in bed. Pt anxious at this time. MD at bedside to assess pt. Pt states he relapsed and did coke/fentanyl and he wanted to kill himself but no longer feels that way he just wants to get clean. Pt states hx of self harm years ago by cutting but no attempts made at this time. States he will voluntarily stay to get help. Denies any pain/symptoms. Pt req meds for anxiety. Orders for ativan in, will carry out.

## 2022-04-23 NOTE — ED Notes (Signed)
Pt given warmed up food tray, states needs are met at this time. Pt calm and cooperative with staff. States no SI, HI, AVH at this time. Pt has mild tremors but states he is just cold, denies anxiety or withdrawal at this time.

## 2022-04-23 NOTE — ED Notes (Signed)
Pt oriented to unit. Pt demonstrated understanding of day room and phone accessibility, as well as food available, extra blankets and shower per request, and location of bathroom nearby.

## 2023-01-03 ENCOUNTER — Observation Stay: Payer: 59

## 2023-01-03 ENCOUNTER — Emergency Department: Payer: 59

## 2023-01-03 ENCOUNTER — Inpatient Hospital Stay
Admission: AD | Admit: 2023-01-03 | Discharge: 2023-01-06 | DRG: 383 | Discharge status: Against Medical Advice | Payer: 59 | Attending: Hospitalist | Admitting: Hospitalist

## 2023-01-03 DIAGNOSIS — Z789 Other specified health status: Secondary | ICD-10-CM

## 2023-01-03 DIAGNOSIS — L0889 Other specified local infections of the skin and subcutaneous tissue: Secondary | ICD-10-CM | POA: Diagnosis present

## 2023-01-03 DIAGNOSIS — L03119 Cellulitis of unspecified part of limb: Principal | ICD-10-CM

## 2023-01-03 DIAGNOSIS — F32A Depression, unspecified: Secondary | ICD-10-CM | POA: Diagnosis present

## 2023-01-03 DIAGNOSIS — M25521 Pain in right elbow: Secondary | ICD-10-CM

## 2023-01-03 DIAGNOSIS — L02423 Furuncle of right upper limb: Secondary | ICD-10-CM | POA: Diagnosis present

## 2023-01-03 DIAGNOSIS — F17210 Nicotine dependence, cigarettes, uncomplicated: Secondary | ICD-10-CM | POA: Diagnosis present

## 2023-01-03 DIAGNOSIS — L03111 Cellulitis of right axilla: Secondary | ICD-10-CM

## 2023-01-03 DIAGNOSIS — T148XXA Other injury of unspecified body region, initial encounter: Secondary | ICD-10-CM

## 2023-01-03 DIAGNOSIS — F1510 Other stimulant abuse, uncomplicated: Secondary | ICD-10-CM

## 2023-01-03 DIAGNOSIS — M7989 Other specified soft tissue disorders: Secondary | ICD-10-CM

## 2023-01-03 DIAGNOSIS — Z881 Allergy status to other antibiotic agents status: Secondary | ICD-10-CM

## 2023-01-03 DIAGNOSIS — F1413 Cocaine abuse, unspecified with withdrawal (CMS-HCC): Secondary | ICD-10-CM | POA: Diagnosis not present

## 2023-01-03 DIAGNOSIS — Z5902 Unsheltered homelessness: Secondary | ICD-10-CM

## 2023-01-03 DIAGNOSIS — F17290 Nicotine dependence, other tobacco product, uncomplicated: Secondary | ICD-10-CM | POA: Diagnosis present

## 2023-01-03 DIAGNOSIS — R600 Localized edema: Secondary | ICD-10-CM

## 2023-01-03 DIAGNOSIS — R21 Rash and other nonspecific skin eruption: Secondary | ICD-10-CM

## 2023-01-03 DIAGNOSIS — L03114 Cellulitis of left upper limb: Secondary | ICD-10-CM

## 2023-01-03 DIAGNOSIS — B1920 Unspecified viral hepatitis C without hepatic coma: Secondary | ICD-10-CM | POA: Diagnosis present

## 2023-01-03 DIAGNOSIS — F1113 Opioid abuse with withdrawal (CMS-HCC): Secondary | ICD-10-CM | POA: Diagnosis not present

## 2023-01-03 DIAGNOSIS — L0390 Cellulitis, unspecified: Secondary | ICD-10-CM | POA: Diagnosis present

## 2023-01-03 DIAGNOSIS — Z807 Family history of other malignant neoplasms of lymphoid, hematopoietic and related tissues: Secondary | ICD-10-CM

## 2023-01-03 DIAGNOSIS — F1110 Opioid abuse, uncomplicated: Secondary | ICD-10-CM

## 2023-01-03 DIAGNOSIS — F1513 Other stimulant abuse with withdrawal: Secondary | ICD-10-CM | POA: Diagnosis not present

## 2023-01-03 DIAGNOSIS — L089 Local infection of the skin and subcutaneous tissue, unspecified: Secondary | ICD-10-CM | POA: Diagnosis present

## 2023-01-03 DIAGNOSIS — L03113 Cellulitis of right upper limb: Principal | ICD-10-CM | POA: Diagnosis present

## 2023-01-03 DIAGNOSIS — Z8051 Family history of malignant neoplasm of kidney: Secondary | ICD-10-CM

## 2023-01-03 DIAGNOSIS — L989 Disorder of the skin and subcutaneous tissue, unspecified: Secondary | ICD-10-CM

## 2023-01-03 LAB — DRUG SCREEN RAPID PANEL 12 NO CONFIRMATION, URINE
Amphetamines, Urine: POSITIVE — AB
Barbiturates: NOT DETECTED
Benzodiazepines: NOT DETECTED
Cocaine: POSITIVE — AB
Fentanyl,  U Scrn: POSITIVE — AB
MDMA: NOT DETECTED
Methadone: NOT DETECTED
Opiates: NOT DETECTED
Oxycodone Lvl, UR: NOT DETECTED
PCP: NOT DETECTED
Propoxyphene: NOT DETECTED
THC: NOT DETECTED

## 2023-01-03 LAB — CBC WITH DIFF, BLOOD
ANC automated: 8.1 10*3/uL (ref 2.0–8.1)
Basophils %: 1 %
Basophils Absolute: 0.1 10*3/uL (ref 0.0–0.2)
Eosinophils %: 2.1 %
Eosinophils Absolute: 0.2 10*3/uL (ref 0.0–0.5)
Hematocrit: 38.6 % — ABNORMAL LOW (ref 39.5–50.0)
Hgb: 13.2 G/DL — ABNORMAL LOW (ref 13.5–16.9)
Lymphocytes %: 17.5 %
Lymphocytes Absolute: 2 10*3/uL (ref 0.9–3.3)
MCH: 30.8 PG (ref 27.0–33.5)
MCHC: 34.2 G/DL (ref 32.0–35.5)
MCV: 90.2 FL (ref 81.5–97.0)
MPV: 7.4 FL (ref 7.2–11.7)
Monocytes %: 7.5 %
Monocytes Absolute: 0.8 10*3/uL (ref 0.0–0.8)
Neutrophils % (A): 71.9 %
PLT Count: 361 10*3/uL (ref 150–400)
RBC: 4.27 10*6/uL — ABNORMAL LOW (ref 4.38–5.62)
RDW-CV: 13.1 % (ref 11.6–14.4)
White Bld Cell Count: 11.3 10*3/uL — ABNORMAL HIGH (ref 4.0–10.5)

## 2023-01-03 LAB — COMPREHENSIVE METABOLIC PANEL, BLOOD
ALT: 10 U/L (ref 7–52)
AST: 12 U/L — ABNORMAL LOW (ref 13–39)
Albumin: 4.1 G/DL — ABNORMAL LOW (ref 4.2–5.5)
Alk Phos: 52 U/L (ref 34–104)
BUN: 13 mg/dL (ref 7–25)
Bilirubin, Total: 0.5 mg/dL (ref 0.0–1.4)
CO2: 28 mmol/L (ref 21–31)
Calcium: 9.3 mg/dL (ref 8.6–10.3)
Chloride: 100 mmol/L (ref 98–107)
Creat: 1 mg/dL (ref 0.7–1.3)
Electrolyte Balance: 7 mmol/L (ref 2–12)
Glucose: 95 mg/dL (ref 70–115)
Potassium: 4.3 mmol/L (ref 3.5–5.1)
Protein, Total: 7.1 G/DL (ref 6.0–8.3)
Sodium: 135 mmol/L — ABNORMAL LOW (ref 136–145)
eGFR - high estimate: >60 (ref >59)
eGFR - low estimate: >60 (ref >59)

## 2023-01-03 LAB — COVID/FLU A B/RSV PANEL
COVID-19 Result: NOT DETECTED
Influenza A, PCR: NOT DETECTED
Influenza B, PCR: NOT DETECTED
Respiratory Syncytial Virus PCR: NOT DETECTED

## 2023-01-03 LAB — SED RATE, BLOOD: Sedimentation Rate: 20 MM/HR — ABNORMAL HIGH (ref 0–15)

## 2023-01-03 LAB — C-REACTIVE PROTEIN, BLOOD: C Reactive Protein: 3.3 MG/DL — ABNORMAL HIGH (ref 0.0–1.0)

## 2023-01-03 LAB — LACTATE, BLOOD: Lactic Acid: 0.9 mmol/L (ref 0.5–2.0)

## 2023-01-03 MED ORDER — ACETAMINOPHEN 325 MG PO TABS
650.0000 mg | ORAL_TABLET | ORAL | Status: DC | PRN
Start: 2023-01-03 — End: 2023-01-06
  Administered 2023-01-05 (×3): 650 mg via ORAL
  Filled 2023-01-03 (×3): qty 2

## 2023-01-03 MED ORDER — SODIUM CHLORIDE FLUSH 0.9 % IV SOLN
10.0000 mL | INTRAVENOUS | Status: DC | PRN
Start: 2023-01-03 — End: 2023-01-06

## 2023-01-03 MED ORDER — VANCOMYCIN 1250 MG IN 250 ML NS PREMIX IVPB (~~LOC~~)
1250.0000 mg | Freq: Two times a day (BID) | INTRAVENOUS | Status: DC
Start: 2023-01-03 — End: 2023-01-04
  Administered 2023-01-03 – 2023-01-04 (×2): 1250 mg via INTRAVENOUS
  Filled 2023-01-03 (×2): qty 250

## 2023-01-03 MED ORDER — CLONIDINE HCL 0.1 MG OR TABS
0.1000 mg | ORAL_TABLET | Freq: Three times a day (TID) | ORAL | Status: DC | PRN
Start: 2023-01-03 — End: 2023-01-06

## 2023-01-03 MED ORDER — HYDROCODONE-ACETAMINOPHEN 5-325 MG OR TABS
1.0000 | ORAL_TABLET | ORAL | Status: DC | PRN
Start: 2023-01-03 — End: 2023-01-03

## 2023-01-03 MED ORDER — LACTATED RINGERS IV SOLN
Freq: Once | INTRAVENOUS | Status: AC
Start: 2023-01-03 — End: 2023-01-03

## 2023-01-03 MED ORDER — SODIUM CHLORIDE FLUSH 0.9 % IV SOLN
10.0000 mL | Freq: Once | INTRAVENOUS | Status: DC | PRN
Start: 2023-01-03 — End: 2023-01-06

## 2023-01-03 MED ORDER — VANCOMYCIN HCL 1 GM IV SOLR
1500.0000 mg | Freq: Once | INTRAVENOUS | Status: AC
Start: 2023-01-03 — End: 2023-01-03
  Administered 2023-01-03: 1500 mg via INTRAVENOUS
  Filled 2023-01-03: qty 1500

## 2023-01-03 MED ORDER — ONDANSETRON HCL 4 MG OR TABS
4.0000 mg | ORAL_TABLET | Freq: Four times a day (QID) | ORAL | Status: DC | PRN
Start: 2023-01-03 — End: 2023-01-06

## 2023-01-03 MED ORDER — BUPRENORPHINE HCL 2 MG SL SUBL
4.0000 mg | SUBLINGUAL_TABLET | Freq: Once | SUBLINGUAL | Status: DC
Start: 2023-01-04 — End: 2023-01-03

## 2023-01-03 MED ORDER — NALOXONE HCL 0.4 MG/ML IJ SOLN
0.1000 mg | INTRAMUSCULAR | Status: DC | PRN
Start: 2023-01-03 — End: 2023-01-06

## 2023-01-03 MED ORDER — VANCOMYCIN IV PER PHARMACY (~~LOC~~)
INTRAVENOUS | Status: DC
Start: 2023-01-03 — End: 2023-01-05

## 2023-01-03 MED ORDER — SODIUM CHLORIDE 0.9 % IJ SOLN (CUSTOM)
50.0000 mL | Freq: Once | INTRAMUSCULAR | Status: DC | PRN
Start: 2023-01-03 — End: 2023-01-06

## 2023-01-03 MED ORDER — BUPRENORPHINE HCL 8 MG SL SUBL
8.0000 mg | SUBLINGUAL_TABLET | SUBLINGUAL | Status: DC
Start: 2023-01-03 — End: 2023-01-03
  Filled 2023-01-03: qty 1

## 2023-01-03 MED ORDER — PERFLUTREN LIPID MICROSPHERE 1.1 MG/ML IV SUSP
0.4000 mL | INTRAVENOUS | Status: DC | PRN
Start: 2023-01-03 — End: 2023-01-06

## 2023-01-03 MED ORDER — BUPRENORPHINE HCL 8 MG SL SUBL
16.0000 mg | SUBLINGUAL_TABLET | Freq: Every day | SUBLINGUAL | Status: DC
Start: 2023-01-05 — End: 2023-01-03

## 2023-01-03 MED ORDER — BUPRENORPHINE HCL 8 MG SL SUBL
8.0000 mg | SUBLINGUAL_TABLET | Freq: Once | SUBLINGUAL | Status: AC | PRN
Start: 2023-01-03 — End: 2023-01-06
  Administered 2023-01-06: 8 mg via SUBLINGUAL
  Filled 2023-01-03 (×4): qty 1

## 2023-01-03 MED ORDER — BUPRENORPHINE HCL 2 MG SL SUBL
4.0000 mg | SUBLINGUAL_TABLET | SUBLINGUAL | Status: DC
Start: 2023-01-03 — End: 2023-01-03

## 2023-01-03 MED ORDER — ACETAMINOPHEN 650 MG RE SUPP
650.0000 mg | RECTAL | Status: DC | PRN
Start: 2023-01-03 — End: 2023-01-06

## 2023-01-03 MED ORDER — STERILE WATER FOR INJECTION IJ SOLN
2000.0000 mg | INTRAMUSCULAR | Status: DC
Start: 2023-01-03 — End: 2023-01-05
  Administered 2023-01-03 – 2023-01-04 (×2): 2000 mg via INTRAVENOUS
  Filled 2023-01-03 (×2): qty 2000

## 2023-01-03 MED ORDER — NICOTINE 21 MG/24HR TD PT24
1.0000 | MEDICATED_PATCH | Freq: Every day | TRANSDERMAL | Status: DC
Start: 2023-01-03 — End: 2023-01-06
  Administered 2023-01-03 – 2023-01-05 (×3): 1 via TRANSDERMAL
  Filled 2023-01-03 (×3): qty 1

## 2023-01-03 MED ORDER — KETOROLAC TROMETHAMINE 30 MG/ML IJ SOLN
15.0000 mg | Freq: Four times a day (QID) | INTRAMUSCULAR | Status: DC | PRN
Start: 2023-01-03 — End: 2023-01-06
  Administered 2023-01-03: 15 mg via INTRAVENOUS
  Filled 2023-01-03: qty 1

## 2023-01-03 MED ORDER — SODIUM CHLORIDE FLUSH 0.9 % IV SOLN
10.0000 mL | Freq: Once | INTRAVENOUS | Status: AC
Start: 2023-01-03 — End: 2023-01-03
  Administered 2023-01-03: 10 mL via INTRAVENOUS

## 2023-01-03 MED ORDER — IOHEXOL 350 MG/ML CO SOLN
100.0000 mL | Freq: Once | Status: AC
Start: 2023-01-03 — End: 2023-01-03
  Administered 2023-01-03: 100 mL via INTRAVENOUS

## 2023-01-03 MED ORDER — ONDANSETRON HCL 4 MG/2ML IV SOLN
4.0000 mg | Freq: Four times a day (QID) | INTRAMUSCULAR | Status: DC | PRN
Start: 2023-01-03 — End: 2023-01-06

## 2023-01-03 MED ORDER — VANCOMYCIN IV PER PHARMACY (~~LOC~~)
INTRAVENOUS | Status: DC
Start: 2023-01-03 — End: 2023-01-03

## 2023-01-03 NOTE — ED Provider Notes (Signed)
CHIEF COMPLAINT:  Rash (Rashes on bilateral arm and face x5 days now. R elbow warm to touch with redness. )     HISTORY OF PRESENT ILLNESS:  Interpreter used: No (English Preferred Language)    Victor Huffman is a 42 year old male with history of IVDU (last use about 1 year ago) who presents with rash on right arm. Pt reports it began with a sore on his R elbow about 1 week ago and new sores have since erupted on his right forearm/hand and around his right eye. Pt reports redness, swelling, and pain at his R elbow x 2 days. States a "pocket of pus" formed above his R elbow, which drained when he squeezed it. Pt reports he is currently living in his car, but no one around him has a similar rash. Denies recent IVDU but states he smokes fentanyl and meth. Endorses sweats, subjective fevers, chills. Denies chest pain, SOB, changes in appetite, and n/v.            PAST MEDICAL HISTORY:  Past Medical History:   Diagnosis Date    Anxiety     Cancer (CMS-HCC)     Major depressive disorder, single episode     Seizures (CMS-HCC)       Patient Active Problem List    Diagnosis Date Noted    Cellulitis 01/03/2023    Suicidal ideations 04/23/2022    Cocaine abuse, uncomplicated (CMS-HCC) 04/23/2022    Mild fentanyl abuse (CMS-HCC) 04/23/2022     SURGICAL HISTORY:  Past Surgical History:   Procedure Laterality Date    RADICAL NECK DISSECTION          ALLERGIES:  Allergies   Allergen Reactions    Ceclor [Cefaclor] Unspecified     Unspecified         FAMILY HISTORY:  Reviewed and considered non-contributory     SOCIAL HISTORY/DETERMINANTS OF HEALTH:  Social History     Socioeconomic History    Marital status: Single   Tobacco Use    Smoking status: Every Day     Types: Cigarettes    Smokeless tobacco: Never   Substance and Sexual Activity    Alcohol use: Not Currently    Drug use: Yes     Types: Opiates (Morphine/Heroin/Oxycodone/Hydrocodone/Fentanyl), Stimulants (Cocaine/Methamphetamine), IV     Social Determinants of  Health     Financial Resource Strain: Not on File (06/17/2021)    Received from [employer]     Financial Resource Strain: 0   Transportation Needs: Not on File (06/17/2021)    Received from [employer] Needs     Transportation: 0   Physical Activity: Not on File (06/17/2021)    Received from [facility]    Physical Activity     Physical Activity: 0   Stress: Not on File (06/17/2021)    Received from [facility]    Stress     Stress: 0   Social Connections: Not on File (12/29/2022)    Received from [employer]    Social Connections     Connectedness: 0    Received from [facility] Stability      VITAL SIGNS:  First Vitals [01/03/23 0601]   Temperature Heart Rate Respirations Blood pressure (BP) SpO2   98.1 F (36.7 C) 103 18 (!) 148/94 98 %     PHYSICAL EXAM:  General: Awake, alert, in no apparent distress.  Head: Normocephalic, atraumatic.  Respiratory: Breathing  comfortably and in no respiratory distress. No audible wheezing or stridor.   Cardiovascular: Extremities are warm and well perfused.  Skin: Crusted lesions on erythematous base present on R elbow, forearm, and hand without active drainage or bleeding. Purulence noted at lesion most proximal to R elbow with surrounding area of erythema and edema. Small crusted lesions on L forearm. Excoriations present over R eyebrow.  Musculoskeletal: Active ROM of R elbow limited in flexion/extension due to pain.   Neuro: Awake and alert. Moving extremities. Ambulates independently with normal gait.  Psych: Answers questions appropriately.    MEDICAL DECISION MAKING:    Initial Impressions:  Victor Huffman is a 42 year old male who presents with cellulitis symptoms to the right arm with limited range of motion secondary to pain, social determinants of health limiting possible discharge as patient is currently lives in car and prior IV drug abuse.  Additionally patients with history of Supracore allergy and concern for inability to discharged  home on 1st generation cephalosporin more analogous medication we will consider admission versus observation    Vital signs and physical exam were notable for:  Cellulitis to right elbow with limited range of motion in flexion and extension    Differential diagnosis includes:  Cellulitis, erysipelas, olecranon bursitis, septic elbow, osteomyelitis    Workup Review:  Pertinent Lab Results (interpreted independently by me):  Follow-up labs sending rickettsial pox screen sending syphilis screen     ESR 20, CRP 3.3    WBC 11.3, Lactate 0.9    CMP unremarkable    Disposition Decision:  Patient's ability to obtain treatment and access outpatient care was complicated by homelessness and substance use, therefore admitted patient.  Admission                                                The patient's condition of cellulitis requires further management and intervention. They will benefit from inpatient hospitalization. Sign-out was given to the accepting team with the information available at the time of admission. I relayed my concerns and recommendations.               The following work up was performed during the encounter. Orders placed after a disposition has been selected will not be listed here. A complete account of orders should be referenced elsewhere:     ED Orders (From admission, onward)      Ordered     Status Ordering Provider    01/03/23 1123  Vancomycin, Trough  ONCE        Comments:        Acknowledged Mickel Baas [facility]    01/03/23 1123  vancomycin (VANCOCIN) 1250 mg in sodium chloride 0.9 % 250 mL IVPB  EVERY 12 HOURS NON ROUTINE         Acknowledged Mickel Baas [facility]    01/03/23 1057  Syphilis Screen, Blood Yellow serum separator tube  ONCE         Acknowledged Mickel Baas [facility]    01/03/23 0845  X-Ray Elbow Complete Min 3 Vws - Right  ONE TIME         Final result Mickel Baas [facility]    01/03/23 0939  vancomycin (VANCOCIN) 1,500 mg in sodium chloride 0.9 % 300 mL IVPB   ONCE         Last  MAR action: New Bag - by Barbra Sarks on 01/03/23 at [address] [facility]    01/03/23 0853  lactated ringers 1,000 mL IV bolus  ONCE         Last MAR action: Completed - by Barbra Sarks on 01/03/23 at [address] [facility]    01/03/23 0845  Syphilis Screen, Blood  ONCE         In process Mickel Baas [facility]    01/03/23 0845  Vancomycin IV Per Pharmacy for >= 14 yo (AUC or Trough-Guided)  AS  DIRECTED         Acknowledged Mickel Baas [facility]    01/03/23 0845  Sedimentation Rate (ESR), Blood Lavender  ONCE         Edited Result - FINAL Mickel Baas [facility]    01/03/23 0845  C-Reactive Protein, Blood Green Plasma Separator Tube  ONCE         Final result Mickel Baas [facility]    01/03/23 0845  Comprehensive Metabolic Panel  ONCE         Final result Mickel Baas [facility]    01/03/23 0845  Blood Culture Blood Culture Set  ONCE        Comments: Draw from peripheral vein (not central or PICC)      In process Mickel Baas [facility]    01/03/23 0845  Blood Culture Blood Culture Set  ONCE        Comments: Draw from peripheral vein (not central or PICC)      In process Mickel Baas [facility]    01/03/23 0845  Lactate, Blood - See Instructions  EVERY 3 HOURS NON SPECIFIED      Comments: Nurse may discontinue the second lactate order if the results of the first are normal (serum lactate < 2.0)      Acknowledged Mickel Baas [facility]    01/03/23 0845  Nursing Misc Order: Suspected Sepsis Alert! Time Sensitive Nursing Orders.  ONGOING        Comments: Follow ED Sepsis Bundle Protocol.    Acknowledged Mickel Baas [facility]    01/03/23 0845  Rickettsia rickettsii Antibodies IgG+IgM  ONCE         In process Mickel Baas [facility]    01/03/23 0845  CBC w/ Diff Lavender  ONCE         Final result Con[facility] Living             ED COURSE/PATIENT REASSESSMENTS:  "ED Course" is listed below that provides real time documentation  during ER encounter and will provide further context to the MDM discussion above.    Workup Summary         Value Comment By Time      1 week of R arm 'rash' with lesion started on elbow with hx of prior IVDA.  Concepcion Living., MD 09/19 [phone]      1 week of worsening Rash to R arm, pt is left handed, 1 year ago IVDA, reports being undomeciled.  Worsening crusted lesions on erythemetous base with area of cellulitis over olecranon with pustule appearing ruptured, concern for cellulitis  Concepcion Living., MD 09/19 [phone]      Sed Rate: 20 (Reviewed) Fabrizio, Alexa 09/19 1159      CRP: 3.3 (Reviewed) Fabrizio, Alexa 09/19 1200     Lactic Acid: 0.9 (Reviewed) Fabrizio, Alexa 09/19 1200      Comprehensive  Metabolic Panel:    Sodium 135(!)   Potassium 4.3   Chloride 100   CO2 28   Anion Gap 7   Glucose 95   BUN 13   Creatinine 1.0   GFR >60   eGFR - high estimate >60   Calcium 9.3   Total Protein 7.1   Albumin 4.1(!)   Alkaline Phos 52   AST (SGOT) 12(!)   ALT (SGPT) 10   Bilirubin, Tot 0.5 No electrolyte derangements or signs of renal/hepatic injury Fabrizio, Alexa 09/19 1200     X-Ray Elbow Complete Min 3 Vws - Right Unremarkable Fabrizio, Alexa 09/19 1203              DIAGNOSIS:    ICD-10-CM ICD-9-CM   1. Cellulitis, upper arm  L03.119 682.3   2. Excoriation  T14.8XXA 919.8                    Estella Husk, MS4    Attending Attestation  I saw patient with medical student, we examined patient together and formulated plan for patient care  Summary: 41yo with cellulitis at elbow R in left handed person.  Reports abscess drainage day pta.  No fever but erythema to posterior elbow with limited ROM but without significant pain. Xray with no posterior sail sign, and seems lower risk of infected joint with ROM without significant pain.    Plan for admission/obs as pt undomeciled and hx of IVDA as social determinants of care could adversely affect morbidity.    IV vancomycin started for coverage for  mssa/mrsa/strep    Concepcion Living, MD  01/07/23 11:17 PM         Concepcion Living., MD  01/07/23 [phone]

## 2023-01-03 NOTE — Interdisciplinary (Signed)
Occupational Therapy Evaluation and Discharge    Admitting Physician:  Tania Ade, DO  Admission Date 01/03/2023    Inpatient Diagnosis:   Problem List         Codes    Cellulitis, upper arm    -  Primary ICD-10-CM: L03.119  ICD-9-CM: 682.3    Excoriation     ICD-10-CM: T14.8XXA  ICD-9-CM: 919.8    Decreased activities of daily living (ADL) [Z78.9]     ICD-10-CM: Z78.9  ICD-9-CM: V49.89            IP Start of Service  Start of Care: 01/03/23  Reason for referral: Decline in performance of activities of daily living (ADL)    Preferred Language:English         Past Medical History:   Diagnosis Date    Anxiety     Cancer (CMS-HCC)     Major depressive disorder, single episode     Seizures (CMS-HCC)       Past Surgical History:   Procedure Laterality Date    RADICAL NECK DISSECTION          OT Acute       Row Name 01/03/23 1700          Type of Visit    Type of Occupational Therapy note Occupational Therapy Evaluation and Discharge       Row Name 01/03/23 1700          Treatment Time    Treatment Start Time 1700     Total TIMED Treatment (min) --  23     Total Treatment Time (min) 53       Row Name 01/03/23 1700          Treatment Precautions/Restrictions    Precautions/Restrictions None       Row Name 01/03/23 1700          Medical History    History of presenting condition Per chart, patient is a 42 year old male who presented to the hospital secondary to R elbow wound, swelling, pain, and sores. Per chart, patient developed more sores on his R wrist and R eyebrow. Per chart, patient found with cellulitis of R elbow, multiple lesions in RUE, and substance abuse. Patient now with physician order for OT eval and tx, indicating "right eblow".     Fall history No falls reported in the last 6 months     Dominant Side Left       Row Name 01/03/23 1700          Functional History    Prior Level of Function No deficits     General ADL/Self-Care Assistance Needs None- Independent with ADLs and self care     Equipment  required for mobility in the home None       Row Name 01/03/23 1700          Social History    Living Situation Lives alone     Home Environment Homeless       Row Name 01/03/23 1700          Subjective    Patient status Patient agreeable to treatment;Nursing in agreement for treatment       Row Name 01/03/23 1700          Pain Assessment    Pain Asssessment Tool Numeric Pain Rating Scale       Row Name 01/03/23 1700          Numeric Pain Rating Scale  Pain Intensity - rating at present 7     Pain Intensity- rating after treatment 7     Location R elbow pain       Row Name 01/03/23 1700          Activities of Daily Living (ADLs)    Self Feeding Independent     Self Grooming Independent     Upper Body Dressing Independent     Lower Body Dressing Independent     Bathing Independent     Other Bathing Information based on simulated performance     Toileting Independent     Other Toileting Information based on simulated performance     Toilet Transfers Other (comments)     Other Toilet Transfers Information independent with general transfers       Row Name 01/03/23 1700          [place] AM-PAC: Daily Activity    Assistance Needed to Put on and Take off Regular Lower Body Clothing 4     Assistance Needed to Bathe, Including Washing, Rinsing, and Drying 4     Assistance Needed to Toilet [employer], Bedpan, or Urinal) 4     Assistance Needed to Put on and Take off Regular Upper Body Clothing 4     Assistance Needed to Take Care of Personal Grooming Such as Brushing Teeth 4     Assistance Needed to Eat Meals 4     AM-PAC Daily Activity Total Score 24     AMP-PAC Daily Activity Impairment rating Score 24 - 0% impaired       Row Name 01/03/23 1700          Objective    Overall Cognitive Status Intact - no cognitive limitations or impairments noted     Communication No communication limitations or impairments noted. Current status of hearing, speech and vision allow functional communication.     Coordination/Motor control No  limitations or impairments noted. Movement patterns are fluid and coordinated throughout     Extremity Assessment Range of motion, strength,  muscle tone and/or sensation limitations present     LUE findings WNL     RUE findings Patient has functional ROM in RUE. Patient was not able to fully extend R elbow, but was close to full extension. Patient is only limited by pain. Anticipate that once infection and pain is resolved, patient will be okay.     Functional Mobility No limitations or impairments in functional mobility noted. Patient independent in mobility activities of daily living                    OT Acute Tool Box       Row Name 01/03/23 1700          Cognition Assessment    Overall Cognitive Status Intact - no cognitive limitations or impairments noted                        Eval cont.       Row Name 01/03/23 1700          Patient/Family Education    Learner(s) Patient     Learner response to rehab patient education interventions Verbalizes understanding     Patient/family training comments safety, protecting RUE from hitting surfaces, being aware of surroundings       Row Name 01/03/23 1700          Assessment    Assessment Chart reviewed. RN cleared. Patient is  independent with basic ADLs and functional mobility. Noted physician order for OT eval and tx indicating "right elbow". However, patient is only minimally limited in R elbow extension only due to pain. Anticipate that once infection and pain resolves, patient will be okay. Patient's RUE is still grossly [facility] for ADLs at this time and is independent. As a result, OT is not indicated at the acute level. Will sign off on OT.       Row Name 01/03/23 1700          Treatment Plan Disussion    Treatment Plan Discussion and Agreement Patient/family/caregiver stated understanding and agreement with the therapy plan       Row Name 01/03/23 1700          Treatment Plan    Frequency of treatment Patient appropriate for discharge from therapy     Duration of  treatment (number of visits) One time only, further treatment not indicated     Status of treatment One time only treatment, further skilled therapy not indicated       Row Name 01/03/23 1700          Patient Safety Considerations     Patient safety considerations Patient returned to bed at end of treatment;Call light left in reach and fall precautions in place;Patient may be at risk for falls;Nursing notified of safety considerations at end of treatment     Patient assistive device requirements for safe ambulation No device required       Row Name 01/03/23 1700          Post Acute Discharge Recommendations    Discharge Rehabilitation Recommendations None- patient currently  has no further skilled therapy needs     Equipment recommendations No equipment needed - patient has own equipment       Row Name 01/03/23 1700          Therapy Plan Communication    Therapy Plan Communication Discussed therapy plan with Nursing and/or Physician       Row Name 01/03/23 1700          Occupational Therapy Patient Discharge Instructions    Your Occupational Therapist suggests the following Continue to complete your self care Activities of Daily Living as frequently as possible       Row Name 01/03/23 1700          MediCal Evaluation    Medi-Cal Eval Initial 30 Minutes (x4100) Completed     Medi-Cal Eval Addtl 15 Min [phone]) 2                     The occupational therapist of record is endorsed by evaluating occupational therapist.

## 2023-01-03 NOTE — Progress Notes (Signed)
Pharmacokinetics Note - Vancomycin (Initial)    Indication: Skin and Soft Tissue and Bloodstream Infection/Sepsis.  Selected Monitoring Parameter:  AUC-Guided  Target Level: 400-600 mcg-hr/mL  Recommendation regarding AUC-guided vs Trough-guided Strategy  Dosing Weight: 77.1 kg (TBW)  SCr:  1.0 (Baseline from 04/2022 was 0.9)  CrCl: Estimated creatinine clearance is 96 mL/min   Risk factors for altered clearance: None  Nephrotoxic Medications: None.         No data to display                  Latest Ref Rng & Units 01/03/2023   Pertinent Results   SCr 0.7 - 1.3 mg/dL 1.0    WBC 4.0 - 16.1 THOUS/MCL 11.3           Initial Assessment/Plan:  - Patient meets criteria for AUC/MIC guided dosing.   - Patient received a Vancomycin 1500mg  x 1 loading dose.   - Will initiate vancomycin 1250 mg Q 12 hr per protocol.  - On this regimen, estimated 24-hour AUC is 528.85. This corresponds to an estimated trough of 10.46 mcg/mL.  - Ordered vancomycin trough level to be drawn on 09/20 at 1030 .  - Continue to monitor SCr daily.    Pharmacy will continue to follow and make adjustments/recommendations to therapy as needed.    For questions, please call Pharmacy at [phone].    [address] [place], [facility]

## 2023-01-03 NOTE — ED Notes (Signed)
Patient A/Ox4, ambulatory with steady gait ,denies pain or sob. Friend present. Patient is aware of pending admission.

## 2023-01-03 NOTE — ED Notes (Signed)
Pt picked up for CT.

## 2023-01-03 NOTE — ED Notes (Signed)
Bed: EDA-06  Expected date:   Expected time:   Means of arrival:   Comments:  Kevin>WR pt

## 2023-01-03 NOTE — ED Notes (Signed)
Pt back from CT, NAD

## 2023-01-03 NOTE — H&P (Signed)
Admission History and Physical [facility] Medicine        Date of Admission: 01/03/2023    Date and Time of NP  Evaluation: 01/03/2023, 1:50 PM    Primary Care Physician:  No Pcp, Per Patient    Chief Complaint: Right arm wound     History of Present Illness:  This is a 42 year old male who presents with right elbow wound beginning with a sore about 1 week ago. Pt states another sore developed next to the 1st one on the elbow which drained pus yesterday. Pt states he then developed more sores on his right wrist and right eyebrow about 3-4 days ago that scabbed over. Pt reports redness, swelling, and pain at his right elbow for the last x 2 days.  Endorses sweats, subjective fevers, fatigue, and chills. Denies chest pain, SOB, changes in appetite, and n/v.     In the ER: significant diagnotic's - CRP 3.3, WBC 11.3, sed rate 20, right elbow x-ray-IMPRESSION:Focal skin irregularity at the soft tissues just medial to the elbow joint, which may be related to reported skin wound with superimposed adjacent soft tissue swelling versus skin tethering to the medial epicondyle of the distal humerus in a setting of surrounding soft tissue swelling. No radiographic evidence of gross subcutaneous gas or osseous erosions suggestive for osteomyelitis. Pt was given - Vanco, 1L LR bolus.     Past Medical/Surgical History:  Patient Active Problem List   Diagnosis    Suicidal ideations    Cocaine abuse, uncomplicated (CMS-HCC)    Mild fentanyl abuse (CMS-HCC)    Cellulitis   Hepatitis C  SCC neck   Depression     Past Surgical History:   Procedure Laterality Date    RADICAL NECK DISSECTION     Tonsillectomy     Family History:  Mother lymphoma and father renal cancer.     Social History:  Social History     Socioeconomic History    Marital status: Single     Spouse name: Not on file    Number of children: Not on file    Years of education: Not on file    Highest education level: Not on file   Occupational History    Not on file   Tobacco  Use    Smoking status: Every Day     Types: Cigarettes    Smokeless tobacco: Never   Substance and Sexual Activity    Alcohol use: Not Currently    Drug use: Yes     Types: Opiates (Morphine/Heroin/Oxycodone/Hydrocodone/Fentanyl), Stimulants (Cocaine/Methamphetamine), IV    Sexual activity: Not on file   Other Topics Concern    Not on file   Social History Narrative    Not on file     Social Determinants of Health     Financial Resource Strain: Not on File (06/17/2021)    Received from [employer]     Financial Resource Strain: 0   Food Insecurity: Not on file (12/24/2022)   Transportation Needs: Not on File (06/17/2021)    Received from [employer] Needs     Transportation: 0   Physical Activity: Not on File (06/17/2021)    Received from [facility]    Physical Activity     Physical Activity: 0   Stress: Not on File (06/17/2021)    Received from [facility]    Stress     Stress: 0   Social Connections: Not on File (12/29/2022)  Received from [employer]     Connectedness: 0   Intimate Partner Violence: Not on file   Housing Stability: Not At Risk (11/18/2021)    Received from [facility] Stability     Housing Stability: Not on file     Housing Stability: Not on file   Hx IVDU last time 1 year ago\  Pt relapsed about 8 months ago and states he uses Fentanyl, speed, and cocaine on a daily bases last use yesterday. Pt states he vapes nicotine daily equivalent to about 1 PPD. Pt denies ETOH use. Pt works as a Curator. He is currently un housed and lives in his car or some times will stay with friends.     Immunization History:    There is no immunization history on file for this patient.    Allergies:  Allergies   Allergen Reactions    Ceclor [Cefaclor] Unspecified     Unspecified       Medications:  HOME MEDS:  Pt denies taking home meds     Review of Systems:  Positives in bold   Constitutional: Denies fever, chills, weight loss, weight gain, fatigue   Head & Neck:  Denies  headache, vision change, hearing change, rhinorrhea, sore throat  Respiratory:  Denies shortness of breath, cough, sputum, hemoptysis  Cardiovascular:  Denies chest pain/pressure, orthopnea, PND, palpitations  Gastrointestinal:  Denies abdominal pain, nausea, vomiting, diarrhea, melena, rectal bleeding  Genitourinary:  Denies dysuria, polyuria, hesitancy, frequency  Musculoskeletal:  Denies joint pain, redness, and swelling right elbow, leg edema. Sores to right eyebrow, right forearm and elbow, and right ankle   Neurological:  Denies focal numbness, weakness, seizure  All other Review of Systems is negative.      Physical Exam:  Vital Signs: No data found.,     Height: Height: 5\' 8"  (172.7 cm),     BMI: Body mass index is 25.85 kg/m.    General:  male NAD  HEENT:  PERRL, EOMI, clear oropharynx  Neck:   supple, normal carotid pulse, no bruits, no JVD, no lymphadenopathy  Chest/Breast:  symmetric  Lungs:  CTA (B)     Heart:  regular, S1 S2, no murmurs  Abdomen:  soft, non distended, non-tender, normo active bowel sounds  GU/Rectal:  deferred   Extremities:  2+ pulses, no edema  Neurological:  alert and oriented x 4, motor 5/5, sensory grossly intact   Skin:  Right elbow lesions with scabs x2, inner elbow warmth, redness, and swelling. Scabbed lesions to right wrist and right eyebrow.       Diagnostic Data:  Nursing Notes Reviewed:  No intake or output data in the 24 hours ending 01/03/23 1350    No data found.         Laboratory Data:    Admission on 01/03/2023   Component Date Value    White Bld Cell Count 01/03/2023 11.3 (H)     RBC 01/03/2023 4.27 (L)     Hgb 01/03/2023 13.2 (L)     Hematocrit 01/03/2023 38.6 (L)     MCV 01/03/2023 90.2     MCH 01/03/2023 30.8     MCHC 01/03/2023 34.2     RDW-CV 01/03/2023 13.1     PLT Count 01/03/2023 361     MPV 01/03/2023 7.4     Diff Type 01/03/2023 DIFFERENTIAL PERFORMED BY AUTOMATED ANALYSIS     Neutrophils % (A) 01/03/2023 71.9     ANC automated  01/03/2023 8.1      Lymphocytes % 01/03/2023 17.5     Lymphocytes Absolute 01/03/2023 2.0     Monocytes % 01/03/2023 7.5     Monocytes Absolute 01/03/2023 0.8     Eosinophils % 01/03/2023 2.1     Eosinophils Absolute 01/03/2023 0.2     Basophils % 01/03/2023 1.0     Basophils Absolute 01/03/2023 0.1     Sedimentation Rate 01/03/2023 20 (H)     C Reactive Protein 01/03/2023 3.3 (H)     Sodium 01/03/2023 135 (L)     Potassium 01/03/2023 4.3     Chloride 01/03/2023 100     CO2 01/03/2023 28     Electrolyte Balance 01/03/2023 7     Glucose 01/03/2023 95     BUN 01/03/2023 13     Creat 01/03/2023 1.0     eGFR - low estimate 01/03/2023 >60     eGFR - high estimate 01/03/2023 >60     Calcium 01/03/2023 9.3     Protein, Total 01/03/2023 7.1     Albumin 01/03/2023 4.1 (L)     Alk Phos 01/03/2023 52     AST 01/03/2023 12 (L)     ALT 01/03/2023 10     Bilirubin, Total 01/03/2023 0.5     Lactic Acid 01/03/2023 0.9      {UA:   Bilirubin, UA   Date Value Ref Range Status   04/23/2022 NEGATIVE NEGATIVE Final       No results found for: "BLOODCULT", "FUNGALBC", "AFBBACTCULT", "URINECULTURE", "QTFERON", "QUANTIFERON", "CRYPTOAG", "CRYPTOCAGCSF", "COCCICF", "COCCICFCSF", "COCCIIMMDIIF", "COCCIIMMCSF", "HISTOAGUR", "CMVDNAQT", "CMVDNAQTCSF"      Other Data:  X-Ray Elbow Complete Min 3 Vws - Right  Result Date: 01/03/2023  Focal skin irregularity at the soft tissues just medial to the elbow joint, which may be related to reported skin wound with superimposed adjacent soft tissue swelling versus skin tethering to the medial epicondyle of the distal humerus in a setting of surrounding soft tissue swelling. No radiographic evidence of gross subcutaneous gas or osseous erosions suggestive for osteomyelitis. I have personally reviewed the images upon which this report is based and agree with the findings and conclusions expressed above.      Impression:  This is a 42 year old male presenting with multiple lesions and warmth, swelling, and redness of right elbow  and findings of multiple lesions who is being admitted for IV antibiotics and further infectious work-up.    Assessment:  # Cellulitis to right elbow  # multiple lesions to right elbow, right forearm, elbow- Ddx MRSA vs disseminated fungal infection,  Acute generalized exanthematous pustulosis, pustular psoriasis   # Substance abuse - smokes Fentanyl and meth, uses cocaine. Pt interested in   # Tobacco dependence- smokes a vape equivalent to 1 PPD  # Homelessness     Chronic   # SCC left neck -s/p resection   # IVDU-last use x 1 year ago  # Hepatitis C- s/p Harvoni   # Depression     Plan:  - Admit to observation on med-surg  - Vanco/CTX  - echo  - HIV, A1C, cocci, crypto, Histo, beta- glucan, syphilis, ricketts, aspergillus, utox  - f/up MRSA nares and blood cultures  - CT right arm  - consult derm in AM  - PRN pain medications Tylenol and Toradol  - COWS scale   - avoid opoid, once pt in withdrawals interested in starting Subutex   - nicotine patch   - consult SW  VTE Risk/Prevention: Progressive mobility   Code Status:  Full code      Plan of care discuss with attending Dr. Matt Holmes Cadmus, AGACNP-BC  I attest that I have advised the patient that I am not a physician or surgeon and that I am a nurse practitioner. I have also advised the patient that they may request and will be allowed to see a physician. In addition, I have advised the patient that in the event of any change in their condition, they will be referred to a physician.     NP Hospitalist    ATTENDING ATTESTATION:    Date and Time of Attending Evaluation:  01/03/2023, 1:50 PM  ATTENDING ATTESTATION:  I saw and evaluated the patient with the APP. I provided a substantive portion of the care of this patient.  I have reviewed and verified this documentation which accurately reflects our care.  I have independently performed the assessment and plan and documented it within the note.    Tania Ade, DO

## 2023-01-03 NOTE — Interdisciplinary (Signed)
Per Dr. Whitney Muse pt needs admission for cellulitis right elbow, med/surg    Called Denise with [facility], [place] to admit observation

## 2023-01-03 NOTE — Interdisciplinary (Signed)
Case Management Follow Up/Progress Note         01/03/23 1618   Follow Up Appointments   PCP Out of Network   Follow Up Appointment Calls Initial Call #1   Appointment Made? Yes (see AVS for appt. details)   Appointment Notes SEE AVS.

## 2023-01-03 NOTE — Interdisciplinary (Signed)
Social Work Assessment        Patient Name:  Victor Huffman   MRN: [id number]   Date of Birth: 05/17/1980    Age: 42 year old   Date of Admission:  01/03/2023         Service Date: January 03, 2023     Assessment  Assessment Type: Initial    Referral Information  Consult Type: Community Resources/Referrals       3:00pm CSW responded to Epic consult for homeless/community resources and substance abuse screening. Pt presented to the hospital with right arm rash x1 week.    CSW attempted to see pt but pt in ED waiting room and inappropriate for assessment at this time.    4:15pm CSW attempted to see pt in EDA but pt being evaluated by nursing staff.    CSW to f/u with pt tomorrow for assessment.    Zenaida Deed, LCSW      Zenaida Deed, LCSW     Date: 01/03/2023    Time: 4:20 PM

## 2023-01-03 NOTE — Interdisciplinary (Signed)
01/03/23 1301   CM Admission Status Order Validation    Review Type ED Initial Review (at time of bed request)   Utilization Review Completed Yes   CM Admission Status Order Validation CM Validated Observation Order   Out of Network Payer Contacted? Yes   Out of Network Payer Name and  Details Angelique Blonder

## 2023-01-03 NOTE — Plan of Care (Signed)
Problem: Promotion of health and safety  Goal: Promotion of Health and Safety  Description: The patient remains safe, receives appropriate treatment and achieves optimal outcomes (physically, psychosocially, and spiritually) within the limitations of the disease process by discharge.  Flowsheets (Taken 01/03/2023 1842)  Standard of Care/Policy:   Medical/Surgical   Skin  Outcome Evaluation (rationale for progressing/not progressing) every shift: Pt a&ox4, VSS on RA, good appetite, reported generalized pain and in the process of withdrawing from daily narcotic use, MD aware and buprenorphrine on order. Scattered lesions observed on R arm, L arm & L ankle. Plan for abx and possible consult dermatology if no improvement.  Individualized Interventions/Recommendations (Discharge Readiness): DC pending medical clearance  Individualized Interventions/Recommendations (Skin/Comfort/Safety/Mobility): maintain skin clean/dry, bed at low position, encourage ambulation  Individualized Interventions/Recommendations (Knowledge): POC discussed wtih pt     Nursing Shift Summary    Overall Mobility/Safe Patient Handling  Progressive Mobility Level: 7  BMAT Score/Safe Patient Handling: BMAT 4- independent OR supervision for fall risk  Assistive Device: None  Walk to/ from bathroom: Independent  Turn in Bed: Independent    Rounding for the past 12 hrs:   Provider Rounding   01/03/23 1754 I, or another nurse, joined the provider team during patient bedside rounds that included, as applicable, the patient.     Shift Comments for the past 12 hrs:   Comments   01/03/23 1754 Primary rounded on pt, plan for consult w/ dermatology & abx

## 2023-01-04 ENCOUNTER — Inpatient Hospital Stay: Payer: 59

## 2023-01-04 DIAGNOSIS — L988 Other specified disorders of the skin and subcutaneous tissue: Secondary | ICD-10-CM

## 2023-01-04 DIAGNOSIS — R45851 Suicidal ideations: Secondary | ICD-10-CM

## 2023-01-04 DIAGNOSIS — L080 Pyoderma: Secondary | ICD-10-CM

## 2023-01-04 DIAGNOSIS — L089 Local infection of the skin and subcutaneous tissue, unspecified: Secondary | ICD-10-CM | POA: Diagnosis present

## 2023-01-04 DIAGNOSIS — I358 Other nonrheumatic aortic valve disorders: Secondary | ICD-10-CM

## 2023-01-04 LAB — BASIC METABOLIC PANEL, BLOOD
BUN: 17 mg/dL (ref 7–25)
CO2: 28 mmol/L (ref 21–31)
Calcium: 8.9 mg/dL (ref 8.6–10.3)
Chloride: 105 mmol/L (ref 98–107)
Creat: 1 mg/dL (ref 0.7–1.3)
Electrolyte Balance: 5 mmol/L (ref 2–12)
Glucose: 113 mg/dL (ref 70–115)
Potassium: 4.2 mmol/L (ref 3.5–5.1)
Sodium: 138 mmol/L (ref 136–145)
eGFR - high estimate: >60 (ref >59)
eGFR - low estimate: >60 (ref >59)

## 2023-01-04 LAB — VANCOMYCIN, TROUGH: Vanco, Trough Lvl: 7.3 ug/mL (ref 5.0–20.0)

## 2023-01-04 LAB — CBC WITH DIFF, BLOOD
ANC automated: 5 10*3/uL (ref 2.0–8.1)
Basophils %: 1 %
Basophils Absolute: 0.1 10*3/uL (ref 0.0–0.2)
Eosinophils %: 4.6 %
Eosinophils Absolute: 0.4 10*3/uL (ref 0.0–0.5)
Hematocrit: 37.5 % — ABNORMAL LOW (ref 39.5–50.0)
Hgb: 12.9 G/DL — ABNORMAL LOW (ref 13.5–16.9)
Lymphocytes %: 22.2 %
Lymphocytes Absolute: 1.8 10*3/uL (ref 0.9–3.3)
MCH: 31.4 PG (ref 27.0–33.5)
MCHC: 34.5 G/DL (ref 32.0–35.5)
MCV: 91.1 FL (ref 81.5–97.0)
MPV: 7.1 FL — ABNORMAL LOW (ref 7.2–11.7)
Monocytes %: 10 %
Monocytes Absolute: 0.8 10*3/uL (ref 0.0–0.8)
Neutrophils % (A): 62.2 %
PLT Count: 336 10*3/uL (ref 150–400)
RBC: 4.12 10*6/uL — ABNORMAL LOW (ref 4.38–5.62)
RDW-CV: 13 % (ref 11.6–14.4)
White Bld Cell Count: 8 10*3/uL (ref 4.0–10.5)

## 2023-01-04 LAB — COMPLETE 2D ECHO WITH BUBBLE STUDY
AO Mean Gradient: 3 mmHg
AO Peak Velocity: 1.21 m/s
Aortic Valve Area: 4.31 cm2
LA Volume Index: 20.7 ml/m2
LV Diastolic Diameter: 4.5 cm
LV Ejection Fraction: 58 %
LV Stroke Volume Index: 36.4 ml/m2
Mitral Valve Area: 4.68 cm2

## 2023-01-04 LAB — MAGNESIUM, BLOOD: Magnesium: 1.9 mg/dL (ref 1.9–2.7)

## 2023-01-04 LAB — MRSA CULTURE

## 2023-01-04 LAB — GLYCOSYLATED HGB(A1C), BLOOD: Glycated Hgb, A1C: 5.2 % (ref 4.6–5.6)

## 2023-01-04 LAB — SED RATE, BLOOD: Sedimentation Rate: 19 MM/HR — ABNORMAL HIGH (ref 0–15)

## 2023-01-04 LAB — HIV 1/2 ANTIBODY & P24 ANTIGEN ASSAY, BLOOD: HIV  1+2 Ab plus HIV 1 p24 Ant Scrn: NONREACTIVE

## 2023-01-04 LAB — CRYPTOCOCCAL ANTIGEN BLOOD: Cryptococcus Neoformans Ag Result: NEGATIVE

## 2023-01-04 LAB — C-REACTIVE PROTEIN, BLOOD: C Reactive Protein: 2.2 MG/DL — ABNORMAL HIGH (ref 0.0–1.0)

## 2023-01-04 MED ORDER — VANCOMYCIN HCL 1 GM IV SOLR
1500.0000 mg | Freq: Two times a day (BID) | INTRAVENOUS | Status: DC
Start: 2023-01-04 — End: 2023-01-05
  Administered 2023-01-04 – 2023-01-05 (×2): 1500 mg via INTRAVENOUS
  Filled 2023-01-04 (×2): qty 1500

## 2023-01-04 NOTE — Interdisciplinary (Addendum)
01/04/23 0949   Assessment   Assessment Type Initial;Face to Face;Verbal   Referral Information   Consult Type Adjustment to Illness/Coping;Community Resources/Referrals;Substance Use Disorder;Other (Comment);Verbal;Supportive Counseling  (Epic consultation order: Homeless/community resources and SUD screening.)   Social Assessment   Where was the patient admitted from? * Homeless   Mode of Arrival Car   Prior to Level of Function * Independent with ADL's   Assistive Device * Not applicable   Primary Caretaker(s) * Self;Parents   Primary Family/Caregiver Contact Name, Number and Relationship * Mother/surrogate, Annette Yockel [phone]   Emergency/Primary Contact Language Preference  English   Permission to Contact * Yes   Interpreter Used? Not Needed   Literacy Can read;Can write   Living Arrangements on Admission* Alone;Homeless   Post Acute Services Referred To TBD  (Pending medical improvements. Pt was open to reup care.)   A List of Production designer, theatre/television/film Provided Programme researcher, broadcasting/film/video Provided Financial risk analyst Referral Not Applicable   Meal Provided Prior to Discharge Yes  (RN to provide upon DC.)   Discharge Medications Provided by Inpatient Pharmacy and Handed to the Patient  (RN to provide upon DC.)   Patient Reported Discharge Destination Car;Other (Comment)  (Pt said he was living in his car. He was open to recup care.)   Homeless Discharge Documentation Complete  CM/SW Completed   Available Assistance/Support System * Friends / neighbors;Parent   Type of Residence * Homeless   Home Care Services * No   Do You Have Transportation Issues/Concerns That Make It Difficult To Get To Your Appointments? * No   Transportation*  Financial controller for * Readiness, willingness, and ability to provide or support self-management activities   Respite Care * Not Applicable   Patient/Family/Other Are In Agreement With Discharge Plan * To be determined    Involvement with Law None   Income Information   Income Source Employed   Military History Not Applicable   Veterans Affiliation No   Mental Health Assessment   Past Mental Health Issues Pt denied SI/HI/AVH. He said he had a hx of depression and anxiety. He said he saw therapist and psychiatrist in the past. He denied taking psych meds at this time.   Mental Status No issues   Behavioral Assessment No issues   Physical Assessment No issues   Mental Status - Orientation Pt was A&OX4.   Have you experienced any neglect or abuse in the past? No   Adjustment to Illness   Patient's Adjustment Acceptance   Over the past two weeks, how often have you been bothered by any of the following problems?   Little interest or pleasure in doing things 1   Feeling down, depressed, or hopeless 1   Depression Scale Subtotal - If Greater than or Equal to 3, complete the following two questions 2   Substance Abuse History (CAGE-AID)   Have you ever felt you ought to cut down on your drinking or drug use? (!) 1   Have people annoyed you by criticizing your drinking or drug use? (!) 1   Have you ever felt bad or guilty about your drinking or drug use? (!) 1   Have you ever had a drink or used drugs first thing in the morning to steady your nerves or to get rid of a hangover? 0   Number of "Yes" Responses (!) 3   If CAGE-AID Score is 2 or greater, note interventions provided CSW  provided SUD resources.   Substance Abuse History Pt said he smoked meth and fentanyl daily for the last three months. The last time use was about two days ago. He denied ETOH and tobacco/marijuana use.   Referral To   Substance Abuse Referral Your next level of care provider will manage your addictions treatment   Community Resources Substance Use Disorder program;Housing;Other (Comment)  (CSW provided MH resources and [facility] General Resource List)   Plans/Interventions/Discharge   Plan/Interventions Explore needs and options for aftercare, provide  referrals;Resources given   Anticipated Discharge Destination Unable to determine at this time   Barriers to Discharge * Clinical reason     CSW noted the Epic consultation order: "Homeless and/or Community resources - Substance Use Disorder Screening." CSW discussed this case with the primary LCSW, Raynelle Fanning, and agreed to follow up with the patient. CSW acknowledged LCSW Julie's effort and documentation.      CSW met the patient at the bedside and introduced herself/her role and the reason for the visit. The patient agreed to an interview, was A&Ox4, and his mood was calm and collected. During the interview, CSW provided reflective listening and supportive counseling, validated the patient's feelings, encouraged the patient to ask questions, and offered anticipatory guidance.    The patient identified himself as unhoused. He said he was living in his car or his friend's house. He said he recently had his FT job about two weeks ago and hoped to find stable housing soon. He receives [employer]. He said a home address on FS is no longer valid and provided his mailing address, which is his mother's home address. He denied having siblings or other families in the area, stating all his families live in [place]. His mother's home address is [address], [place], [place] [address].     CSW screened for mental health conditions, such as depression, anxiety, or SI/HI/AVH - The patient shared that he has a history of depression and anxiety. He said he saw a therapist and psychiatrist in the past. He denied seeing a mental health professional or taking psych meds at this time. CSW offered mental health resources, and the patient was receptive to obtaining them. He also said he was open to a psychiatrist at the [facility] to discuss his depression and anxiety further.     CSW screened for substance use/abuse, such as ETOH, smoking, and illicit drug use - the patient shared that he smokes fentanyl and meth daily. He said it has been about  three months of using illicit drug use. His UDS screened positive for cocaine, amphetamines, and fentanyl. He said the last time he smoked meth and fentanyl was about two days ago. He denied ETOH and tobacco/marijuana use. CSW offered SUD resources, and the patient was receptive to obtaining them. He said he was familiar with SUD intake process and said he had been in a residential detox facility before.     CSW explained and completed PHQ-9, GAD-7, and DAST. His score is as below.      CSW explored and discussed who could be the patient's surrogate. The patient said it would be his mother, Alto Mesmer (cell: [phone]). CSW explored whether the patient wanted to add his friends as his secondary emergency contact, but the patient declined.       CSW further assessed coping, psychosocial needs, and community resource needs. The patient denied further questions or concerns. CSW provided the contact information and encouraged the patient to contact CSW as needed. The patient  appreciated CSW's visit, the support, and the resources discussed and provided. The patient appeared to have limited support and cope within normal limits. No further needs were identified at this time.      Intervention: CSW provided [facility] general resource list, [facility] contact information, and SUD/[facility]UN resources.      Dispo: TBD - pending medical improvements. Pt was open to recup care upon DC.      Below are pt's PHQ-9, GAD-7, and DAST results.      PHQ-9     01/04/23   Over the last two weeks, how often have you been bothered by any of the following problems?   Little interest or pleasure in doing things Several days 1   2. Feeling down, depressed, or hopeless Several days 1   3. Trouble falling or staying asleep, or sleeping too much More than half the days 2   4. Feeling tired or having little energy More than half the days 2   5. Poor appetite or overeating Several days 1   6. Feeling bad about yourself--or that you are a failure  to have let yourself or your family down Several days 1   7. Trouble concentrating on things, such as reading the newspaper or watching television More than half the days 2   8. Moving or speaking so slowly that other people could have noticed.  Or the opposite--being so fidgety or restless that you have been moving around a lot more than usual Not at all 0   9. Thoughts that you would be better off dead, or of hurting yourself in some way Not at all 0   Summary   If you checked off any problems, how difficult have these problems made it for you to do your work, take care of things at home, or get along with other people? N/A. Pt did not answer it.    PHQ9 Patient Summary Score (calculated) 10 - Moderate depression          GAD-7   01/04/2023   Ask the patient: how often have they been bothered by the following over the past 2 weeks?   Feeling nervous, anxious, or  on edge Several days 1   Not being able to stop or control worrying Several days 1   Worrying too much about different things More than half the days 2   Trouble relaxing More than half the days 2   Being so restless that it's hard to sit still More than half the days 2   Becoming easily annoyed or irritable Nearly every day 3   Feeling afraid as if something awful might happen More than half the days 2   Ask the patient: how difficult have these problems made it to do work, take care of things at home, or get along with other people? Somewhat difficult    Summary   GAD-7 Patient Summary Score (calculated) 13 - Moderate anxiety         DAST    01/04/23   Have you used drugs other than those required for medical reasons?   Yes 1    2. Do you use more than one drug at a time   Yes 1    3.  Are you always able to stop using drugs when you want to?  No 1   4. Have you had "blackouts" or "flashbacks" as a result of drug use?  Yes 1    5. Do you ever feel bad  or guilty about your drug use?  Yes 1    6. Does your spouse (or parents) ever complain about your  involvement with drugs?    Yes 1    7. Have you neglected your family because of your use of drugs?   Yes 1    8. Have you engaged in illegal activities in order to obtain drugs?  Yes 1    9. Have you ever experienced withdrawal symptoms (felt sick) when you stopped taking drugs?  Yes 1    10. Have you had medical problems as a result of your drug use (e.g., memory loss, hepatitis, convulsions, bleeding, etc.)?  Yes 1    Score  10 = Severe Level       CSW updated the medical team on the results of the above assessments.       Tun[place] Xion Debruyne  Float Clinical Social Worker II

## 2023-01-04 NOTE — Progress Notes (Signed)
INTERNAL MEDICINE INPATIENT PROGRESS NOTE - [facility] MEDICINE     Date of Evaluation: 01/04/2023     Service: Medicine Hospitalist Team O     Subjective:     "The wound on my right brows hurts a lot. "    Review of Systems:  - At least 2 systems were reviewed and are negative unless otherwise stated above.      Objective:     Vital Signs:  Ht.: Height: 5\' 8"  (172.7 cm),     BMI: Body mass index is 25.98 kg/m.  Temperature:  [97.7 F (36.5 C)-98.4 F (36.9 C)] 98.1 F (36.7 C) (09/20 0740)  Blood pressure (BP): (110-138)/(69-93) 124/78 (09/20 0740)  Heart Rate:  [57-93] 57 (09/20 0740)  Respirations:  [15-18] 16 (09/20 0740)  Pain Score: 3 (09/20 0349)  O2 Device: None (Room air) (09/20 0120)  SpO2:  [95 %-99 %] 99 % (09/20 0740)    Physical Exam:  General:  male NAD  HEENT:  PERRL, EOMI, clear oropharynx  Neck:   supple, normal carotid pulse, no bruits, no JVD, no lymphadenopathy  Chest/Breast:  symmetric  Lungs:  CTA (B)     Heart:  regular, S1 S2, no murmurs  Abdomen:  soft, non distended, non-tender, normo active bowel sounds  GU/Rectal:  deferred   Extremities:  2+ pulses, no edema  Neurological:  alert and oriented x 4, motor 5/5, sensory grossly intact   Skin:  Right elbow lesions with scabs x2, inner elbow warmth, redness, and swelling. Scabbed lesions to right wrist and right eyebrow.      Diagnostic Data:     Common Laboratory Data:    BMP  138 (09/20) 105 (09/20) 17 (09/20) 113 (09/20)   4.2 (09/20) 28 (09/20) 1.0 (09/20)        LFT  7.1 (09/19) 10 (09/19) 0.5 (09/19) 52 (09/19)   4.1* (09/19) 12* (09/19)          CBC  8.0 (09/20) 12.9* (09/20) 336 (09/20)    37.5* (09/20)       Assessment & Plan     This is a 42 year old male presenting with multiple lesions and warmth, swelling, and redness of right elbow and findings of multiple lesions who is being admitted for IV antibiotics and further infectious work-up.     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   #Multiple  lesions to right elbow, right forearm, elbow- Ddx MRSA vs disseminated fungal infection,  Acute generalized exanthematous pustulosis, pustular psoriasis   # Polysubstance use disorder - smokes Fentanyl and meth, uses cocaine. Pt interested in   # Tobacco dependence- smokes a vape equivalent to 1 PPD  # Homelessness, lives in car     Chronic   # SCC left neck -s/p resection   # IVDU-last use x 1 year ago  # Hepatitis C- s/p Harvoni   # Depression     -Dermatology consulted. Spoke with fellow who agreed to see patient.Awaiting recommendations  -Antibiotic therapy: Ceftriaxone and Vancomycin ( end TBD)  -Follow-up cultures: blood culture, cocci, aerobic culture of wound  - if MRSA nares negative, DC Vancomycin  -Ordered Quant Gold  - CSW for homelessness  - avoid opoid, once pt in withdrawals interested in starting Subutex     DVT PPx:    Code Status: Orders Placed This Encounter      Full Code / Full resuscitative therapy / full diagnostic & therapeutic care  Overall plan:     Pending work-up. Dermatology consulted    Kary Kos, NP-C  Internal Medicine    I attest that I have advised the patient that I am not a physician or surgeon and that I am a nurse practitioner. I have also advised the patient that they may request and will be allowed to see a physician. In addition, I have advised the patient that in the event of any change in their condition outside of my education and training, they will be referred to a physician.        Selected Medications:  SCHEDULED MEDS:   cefTRIAXone (ROCEPHIN) IV (Adult)  2,000 mg Q24H NR    nicotine  1 patch Daily    vancomycin (VANCOCIN) IVPB  1,500 mg Q12H NR       PRN MEDS:   acetaminophen  650 mg Q4H PRN    Or    acetaminophen  650 mg Q4H PRN    buprenorphine  8 mg Once PRN    cloNIDine  0.1 mg Q8H PRN    ketOROLAC  15 mg Q6H PRN    nalOXone  0.1 mg Q2 Min PRN    ondansetron  4 mg Q6H PRN    Or    ondansetron  4 mg Q6H PRN    perflutren lipid microspheres  0.4 mL PRN     sodium chloride  10 mL PRN    sodium chloride  10 mL PRN    sodium chloride  10 mL Once PRN    sodium chloride  50 mL Once PRN        Attending Attestation   ATTENDING ATTESTATION:  I saw and evaluated the patient with the APP. I provided a substantive portion of the care of this patient.  I have reviewed and verified this documentation which accurately reflects our care.  I have independently performed the assessment and plan and documented it within the note.    Tania Ade, DO     Required Daily Device Assessment:  This patient does not have a documented central line or urinary catheter.

## 2023-01-04 NOTE — Progress Notes (Signed)
Pharmacokinetics Follow-Up Note - Vancomycin AUC/MIC  Indication: Skin and Soft Tissue and Bloodstream infection  Current Regimen: Vancomycin 1250 mg Q 12 hr. Treatment day # 2.  Recommendation regarding AUC-guided vs Trough-guided Strategy  Selected Monitoring Parameter:  AUC-Guided  Target Level: 400-600 mcg-hr/mL  Dosing Weight:: 77.5 kg (TBW)  CrCl: Estimated creatinine clearance is 96 mL/min  Risk factors for altered clearance: Recent nephrotoxic agents      Latest Ref Rng & Units 01/04/2023   Recent Vanco Drug Levels   Vanco Trough 5.0 - 20.0 MCG/ML 7.3             Latest Ref Rng & Units 01/03/2023 01/04/2023   Pertinent Results   SCr 0.7 - 1.3 mg/dL 1.0  1.0    WBC 4.0 - 09.8 THOUS/MCL 11.3  8.0           INTAKE / OUTPUT (Yesterday 0601 - Today 0600)  In: 1213.3 [P.O.:780; I.V.:433.3]  Out: -  mL/24 Hrs      Pharmacokinetics Parameters on Current Regimen  Vd: 54 L  Ke: 0.115 /hr  t1?2: 6.02 hr    Assessment/Plan:  - Patient meets criteria for AUC/MIC guided dosing.  -The vancomycin Trough level drawn 11 hours after last dose was hung was 7.3 mcg/mL.  - Estimated AUC = 385, sub-therapeutic on 1250 mg Q 12 hr.   - Creatinine and urine output are stable.   - Medication profile reviewed. Have new nephrotoxic medications been started? Yes, IV contrast on 9/19  - Will increase vancomycin dose to 1500 mg Q 12 hr per protocol. On this regimen, estimated 24-hour AUC is 486. This corresponds to an estimated trough of 7.9 mcg/mL.  - Ordered vancomycin random level to be drawn on 01/05/23 at 17:00 .  - Continue to monitor SCr daily.    Pharmacy will continue to follow and make adjustments/recommendations to therapy as needed.    For questions, please call Pharmacy at [phone].    Almira Coaster, [place]

## 2023-01-04 NOTE — Plan of Care (Signed)
Problem: Promotion of health and safety  Goal: Promotion of Health and Safety  Description: The patient remains safe, receives appropriate treatment and achieves optimal outcomes (physically, psychosocially, and spiritually) within the limitations of the disease process by discharge.  01/04/2023 1722 by Wendie Chess, RN  Flowsheets  Taken 01/04/2023 1721  Outcome Evaluation (rationale for progressing/not progressing) every shift: Pt alert and oriented x4. Room air. Continue IV antibotics. Afebrile. VSS. COWS 0. Dermatology consulted and performed tissue biopsy at bedside and sent to lab. Tolerating diet.  Individualized Interventions/Recommendations (Discharge Readiness): Monitor patient for withdrawal symptoms. Monitor and manage pain.  Individualized Interventions/Recommendations (Skin/Comfort/Safety/Mobility): Encourage mobility in and out of bed as tolerated. Keep skin clean and dry. Keep call light within reach. Hourly rounding. Fall/safety precautions.  Individualized Interventions/Recommendations (Knowledge): Keep patient updated on plan of care.  Taken 01/04/2023 1233  Standard of Care/Policy:   Medical/Surgical   Pain   Skin   Falls Reduction  Note: Nursing Shift Summary    No data found.  No data found.    Overall Mobility/Safe Patient Handling  Progressive Mobility Level: 7  BMAT Score/Safe Patient Handling: BMAT 4- independent OR supervision for fall risk  Assistive Device: None  Walk to/ from bathroom: Independent  Turn in Bed: Independent                   Rounding for the past 12 hrs:   Provider Rounding   01/04/23 0900 I, or another nurse, joined the provider team during patient bedside rounds that included, as applicable, the patient.     No data found.    01/04/2023 1233 by Wendie Chess, RN  Flowsheets (Taken 01/04/2023 1233)  Standard of Care/Policy:   Medical/Surgical   Pain   Skin   Falls Reduction

## 2023-01-04 NOTE — Interdisciplinary (Signed)
Care Management Assessment, Adult       Initial Assessment  CM Initial Assessment *: (P) Completed    Patient Information  Where was the patient admitted from? *: (P) Homeless  Address on Facesheet correct?*: (P) Homeless  Patient contact phone number on Facesheet correct? *: (P) No  Patient contact information updated in Demographics activity?: (P) Yes  PCP listed on Facesheet correct? *: (P) Yes  Prior to Level of Function *: (P) Independent with ADL's  Prior to Level of Function *: (P) Ambulatory;Independent with ADL's  Assistive Device *: (P) Not applicable  Home Care Services *: No  Available Assistance/Support System  and Prior Community Resources *: (P) Family member(s)  Patient Has Decision Making Capacity *: (P) Yes  Primary Caretaker(s) *: (P) Self  Primary Family/Caregiver Contact Name, Number and Relationship *: (P) Mother/surrogate, Elon Jester [phone]  Permission to Contact *: (P) Yes     Income Information  Income Source: Corporate investment banker History: Not Applicable  Veterans Affiliation: No     Discharge Planning  Living Arrangements on Admission*: (P) Homeless;Alone  Artist Referral: (P) Patient Declined  Available Assistance/Support System *: (P) Family member(s)  Type of Residence *: (P) Homeless  Patient's Discharge Goal(s) *: (P) Other (Comment) (TBD)  Anticipated Discharge Disposition/Needs *: (P) Too soon to be determined  Anticipated DischargeTransportation * : (P) Car  Barriers to Discharge *: (P) Homeless  Patient Engaged in Discharge Planning *: (P) Yes  PATIENT CHOICE: Patient/Family/Legal/Surrogate Decision Maker Has Been Given a List Options And Choice In The Selection of Post-Acute Care Providers: (P) Yes  Family/Caregiver's Assessed for *: (P) Readiness, willingness, and ability to provide or support self-management activities;Readiness to provide care to the patient  Respite Care *: (P) Not Applicable  Patient/Family/Other Are In Agreement With Discharge  Plan *: (P) To be determined  Public Health Clearance Needed *: (P) Not Applicable    Social Worker Consult  Do you need to see a Child psychotherapist? *: (P) Yes    Readmission Risk Assessment  Readmission Within 30 Days of Discharge *: (P) No  Do You Have Transportation Issues/Concerns That Make It Difficult To Get To Your Appointments? *: (P) No  Recent Hospitalizations (Within Last 6 Months) *: (P) No  High Risk For Readmission *: (P) No

## 2023-01-04 NOTE — Progress Notes (Addendum)
Dermatology Initial Hospital Care Attending Attestation    I was present for and performed a history and examination of the patient. I am personally involved in the management of the patient. I was present for the entirety of the procedure(s) documented by the resident.      I agree with the findings and care plan as documented in the resident's excellent note.      Gilford Rile, MD  Assistant Professor of Dermatology  [facility]    INPATIENT DERMATOLOGY INITIAL CONSULT NOTE    PRIMARY TEAM: Medicine Hospitalist Team O  REASON FOR CONSULT: Boils    PATIENT: Victor Huffman  MRN: [id number]  DOB: Nov 04, 1980      HPI:    Victor Huffman is a 42 year old male with hx of IV drug use, hepatitis C, depression, and homelessness who presents for evaluation of multiple painful boils.  Dermatology consulted for evaluation of these boils. 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 These were painful and drained a purulent fluid. These boils then spread to his L upper arm and L ankle but seem to be getting better. During this time, he also notes severe fatigue, as well as fevers and chills. He denies any recent IVDU (does endorse smoking), gardening, or recent travel.    Past Medical History:   Diagnosis Date    Anxiety     Cancer (CMS-HCC)     Major depressive disorder, single episode     Seizures (CMS-HCC)      Past Surgical History:   Procedure Laterality Date    RADICAL NECK DISSECTION       Social History     Socioeconomic History    Marital status: Single     Spouse name: Not on file    Number of children: Not on file    Years of education: Not on file    Highest education level: Not on file   Occupational History    Not on file   Tobacco Use    Smoking status: Every Day     Types: Cigarettes    Smokeless tobacco: Never   Substance and Sexual Activity    Alcohol use: Not Currently    Drug use: Yes     Types:  Opiates (Morphine/Heroin/Oxycodone/Hydrocodone/Fentanyl), Stimulants (Cocaine/Methamphetamine), IV    Sexual activity: Not on file   Other Topics Concern    Not on file   Social History Narrative    Not on file     Social Determinants of Health     Financial Resource Strain: Not on File (06/17/2021)    Received from [employer]     Financial Resource Strain: 0   Food Insecurity: Not on file (12/24/2022)   Transportation Needs: Not on File (06/17/2021)    Received from [employer] Needs     Transportation: 0   Physical Activity: Not on File (06/17/2021)    Received from [facility]    Physical Activity     Physical Activity: 0   Stress: Not on File (06/17/2021)    Received from [facility]    Stress     Stress: 0   Social Connections: Not on File (12/29/2022)    Received from [employer]    Social Connections     Connectedness: 0   Intimate Partner Violence: Not on file   Housing Stability:  Not At Risk (11/18/2021)    Received from [facility] Stability     Housing Stability: Not on file     Housing Stability: Not on file     No family history on file.  Current Facility-Administered Medications   Medication Dose Route Frequency Provider Last Rate Last Admin    acetaminophen (TYLENOL) tablet 650 mg  650 mg Oral Q4H PRN Cadmus, Sonya, NP        Or    acetaminophen (TYLENOL) suppository 650 mg  650 mg Rectal Q4H PRN Cadmus, Sonya, NP        buprenorphine (SUBUTEX) SL tablet 8 mg  8 mg Sublingual Once PRN Cadmus, Sonya, NP        cefTRIAXone (ROCEPHIN) 2,000 mg in sterile water (PF) 20 mL IV  2,000 mg IntraVENOUS Q24H NR Cadmus, Sonya, NP   2,000 mg at 01/03/23 1746    cloNIDine (CATAPRES) tablet 0.1 mg  0.1 mg Oral Q8H PRN Cadmus, Sonya, NP        ketOROLAC (TORADOL) injection 15 mg  15 mg IntraVENOUS Q6H PRN Cadmus, Sonya, NP   15 mg at 01/03/23 1744    nalOXone (NARCAN) injection 0.1 mg  0.1 mg IntraVENOUS Q2 Min PRN Cadmus, Sonya, NP        nicotine (NICODERM CQ) 21 MG/24HR patch 1 patch  1 patch  Transdermal Daily Cadmus, Sonya, NP   1 patch at 01/04/23 0858    ondansetron (ZOFRAN) tablet 4 mg  4 mg Oral Q6H PRN Cadmus, Sonya, NP        Or    ondansetron (ZOFRAN) injection 4 mg  4 mg IntraVENOUS Q6H PRN Cadmus, Sonya, NP        perflutren lipid microspheres (DEFINITY) injection 0.4 mL  0.4 mL IntraVENOUS PRN Cadmus, Sonya, NP        sodium chloride 0.9 % flush 10 mL  10 mL IntraVENOUS PRN Cadmus, Sonya, NP        sodium chloride 0.9 % flush 10 mL  10 mL IntraVENOUS PRN Cadmus, Sonya, NP        sodium chloride 0.9 % flush 10 mL  10 mL IntraVENOUS Once PRN Mahoney, Roshan E, DO        sodium chloride 0.9 % flush 50 mL  50 mL IntraVENOUS Once PRN Mahoney, Roshan E, DO        vancomycin (VANCOCIN) 1,500 mg in sodium chloride 0.9 % 300 mL IVPB  1,500 mg IntraVENOUS Q12H NR Valesky, Raelyn Ensign., MD        Vancomycin IV Per Pharmacy for >= 14 yo (AUC or Trough-Guided)   Other As Directed Concepcion Living., MD         No current outpatient medications on file.        Allergies:  Ceclor [cefaclor]      Review of Systems:  Please refer to HPI for pertinent positives or negatives    Physical Exam:      Vitals: BP 95/71 (BP Location: Left arm, BP Patient Position: Semi-Fowlers)   Pulse 77   Temp 98.1 F (36.7 C)   Resp 16   Ht 5\' 8"  (1.727 m)   Wt 77.5 kg (170 lb 13.7 oz)   SpO2 98%   BMI 25.98 kg/m    General: Pleasant, NAD, appears stated age    Skin:  Skin exam performed including face, conjunctiva, lips, neck, chest, back, abdomen, right upper, left upper, right lower, left  lower extremity, hands, nails, and the following was noted:    - several healing erythematous papules with yellow red crusting scattered on the R arm, L upper arm, face, and L ankle. One lesion on the R forearm had a pustule still intact (Biopsied for H&E and tissue culture today)  - No perioral or gingival cyanosis or lesions. No erosions or ulcerations. Tongue is normal in appearance    Micro:     Pending    Imaging:    X-Ray Chest Single View   Final Result   FINDINGS/      HEART AND MEDIASTINUM: Within normal limits.      LUNGS: Clear.      PLEURA: No effusion.      Medical devices: None.          1.  No evidence of acute cardiopulmonary disease. No active TB.         CTA Rt Upper Extremity   Final Result      1.  No CT imaging findings suggestive of acute osteomyelitis.   2.  Posterior right elbow skin and subdermal thickening with adjacent subcutaneous edema and fat stranding of medial and lateral distal right upper arm, elbow, and proximal forearm, may reflect nonspecific cellulitis. No drainable focal fluid collection or subcutaneous gas.   3.  Widely patent right upper extremity arteries without abnormality.       I have personally reviewed the images upon which this report is based and agree with the findings and conclusions expressed above.      X-Ray Elbow Complete Min 3 Vws - Right   Final Result      Focal skin irregularity at the soft tissues just medial to the elbow joint, which may be related to reported skin wound with superimposed adjacent soft tissue swelling versus skin tethering to the medial epicondyle of the distal humerus in a setting of surrounding soft tissue swelling. No radiographic evidence of gross subcutaneous gas or osseous erosions suggestive for osteomyelitis.       I have personally reviewed the images upon which this report is based and agree with the findings and conclusions expressed above.          Lab Review:    Lab Results   Component Value Date    WBCCOUNT 8.0 01/04/2023    HGB 12.9 (L) 01/04/2023    HCT 37.5 (L) 01/04/2023    MCV 91.1 01/04/2023    RDW 13.0 01/04/2023    PLT 336 01/04/2023       Lab Results   Component Value Date    K 4.2 01/04/2023    CL 105 01/04/2023    BUN 17 01/04/2023    CREAT 1.0 01/04/2023    GLU 113 01/04/2023    [place] 8.9 01/04/2023       Lab Results   Component Value Date    AST 12 (L) 01/03/2023    ALT 10 01/03/2023    ALK 52 01/03/2023     ALB 4.1 (L) 01/03/2023    TBILI 0.5 01/03/2023       Dermatopathology:  pending      Assessment & Plan:     Daily Hueston is a 42 year old male with hx of IV drug use, hepatitis C, depression, and homelessness who presents for evaluation of multiple painful boils.  Dermatology consulted for evaluation of these boils. Overall the clinical exam is concerning for ecthyma, though given the patients history and exposures, it is reasonable to  rule out atypical infectious agents.    #Crusted pustules and boils, favor ecthyma  - Favor ecthyma clinically, most commonly 2/2 staph or Group A strep but will perform biopsy with tissue culture to eval for less common infections. Please see brief procedure note below  - Follow up dermpath and tissue culture results   - Agree with wound swab, follow culture results  - Agree with blood cultures, HIV, quant gold, cocci and other fungal serologies; follow results  - Called primary team to relay our plan  - abx per the primary team  - Sutures will need to be removed in 14 days (01/18/2023), any provider can do this. Please cover with vaseline and a bandage daily until suture removal.    Brief procedure note:    After informed verbal consent was obtained, using an alcohol swab for cleansing and 1% Lidocaine with epinephrine for anesthetic, with sterile technique, a 4 mm punch biopsy tool was used to obtain a biopsy specimen of the lesion. Two samples were taken with the patients consent. Hemostasis was obtained by pressure and simple interrupted suture(s) were placed with 4-0 prolene.  Aquaphor ointment and a dressing was applied, and wound care instructions and warning signs of infection were provided.  Pt instructed to take to keep the wound dry and covered for 24 hours and clean daily thereafter.  The specimen was labeled and sent for pathologic examination. The patient tolerated the procedure well with no immediate complications. The patient was instructed to return to  clinic in 7-14 days for suture removal. The patient was advised to call our dermatology office if he/she does not receive biopsy results in two weeks.    Thank you for this very interesting consult. Dermatology Inpatient Consult Service will follow-up pending biopsy results. Please page Dermatology with any questions.    The patient was seen and examined with Gilford Rile, MD, who agrees with the above assessment and plan.    Resident Physician:  Blima Dessert, MD, PGY-4

## 2023-01-04 NOTE — ED Notes (Signed)
Handoff given to [facility].

## 2023-01-05 LAB — CBC WITH DIFF, BLOOD
ANC automated: 5.2 10*3/uL (ref 2.0–8.1)
Basophils %: 1 %
Basophils Absolute: 0.1 10*3/uL (ref 0.0–0.2)
Eosinophils %: 4.7 %
Eosinophils Absolute: 0.4 10*3/uL (ref 0.0–0.5)
Hematocrit: 39.4 % — ABNORMAL LOW (ref 39.5–50.0)
Hgb: 13.7 G/DL (ref 13.5–16.9)
Lymphocytes %: 22.1 %
Lymphocytes Absolute: 1.9 10*3/uL (ref 0.9–3.3)
MCH: 31.5 PG (ref 27.0–33.5)
MCHC: 34.7 G/DL (ref 32.0–35.5)
MCV: 90.7 FL (ref 81.5–97.0)
MPV: 7.1 FL — ABNORMAL LOW (ref 7.2–11.7)
Monocytes %: 9.6 %
Monocytes Absolute: 0.8 10*3/uL (ref 0.0–0.8)
Neutrophils % (A): 62.6 %
PLT Count: 357 10*3/uL (ref 150–400)
RBC: 4.34 10*6/uL — ABNORMAL LOW (ref 4.38–5.62)
RDW-CV: 13 % (ref 11.6–14.4)
White Bld Cell Count: 8.4 10*3/uL (ref 4.0–10.5)

## 2023-01-05 LAB — BASIC METABOLIC PANEL, BLOOD
BUN: 16 mg/dL (ref 7–25)
CO2: 27 mmol/L (ref 21–31)
Calcium: 9 mg/dL (ref 8.6–10.3)
Chloride: 103 mmol/L (ref 98–107)
Creat: 0.9 mg/dL (ref 0.7–1.3)
Electrolyte Balance: 9 mmol/L (ref 2–12)
Glucose: 89 mg/dL (ref 70–115)
Potassium: 4.4 mmol/L (ref 3.5–5.1)
Sodium: 139 mmol/L (ref 136–145)
eGFR - high estimate: >60 (ref >59)
eGFR - low estimate: >60 (ref >59)

## 2023-01-05 LAB — SED RATE, BLOOD: Sedimentation Rate: 24 MM/HR — ABNORMAL HIGH (ref 0–15)

## 2023-01-05 LAB — ASPERGILLUS GALACTOMANNAN AG, BLOOD
Aspergillus Galactomannan Ag Index: 0.04
Aspergillus Galactomannan Agt, Bld: NEGATIVE

## 2023-01-05 LAB — C-REACTIVE PROTEIN, BLOOD: C Reactive Protein: 1.5 MG/DL — ABNORMAL HIGH (ref 0.0–1.0)

## 2023-01-05 LAB — (1,3)-BETA-D-GLUCAN (FUNGITELL)
1,3 Beta-D-Glucan Interp: NEGATIVE
1,3 Beta-D-Glucan Result: 31 pg/mL

## 2023-01-05 LAB — BODY SITE CULTURE W/GRAM STAIN, AEROBIC ONLY

## 2023-01-05 LAB — RICKETTSIA RICKETTSII ANTIBODIES IGG+IGM
R.  rickettsii Ab IgG: <1:64 {titer}
R.  rickettsii Ab IgM: <1:64 {titer}

## 2023-01-05 MED ORDER — MUPIROCIN 2 % EX OINT
1.0000 | TOPICAL_OINTMENT | Freq: Two times a day (BID) | CUTANEOUS | Status: DC
Start: 2023-02-02 — End: 2023-01-06

## 2023-01-05 MED ORDER — MUPIROCIN 2 % EX OINT
1.0000 | TOPICAL_OINTMENT | Freq: Two times a day (BID) | CUTANEOUS | Status: DC
Start: 2023-01-19 — End: 2023-01-06

## 2023-01-05 MED ORDER — SULFAMETHOXAZOLE-TRIMETHOPRIM 800-160 MG OR TABS
1.0000 | ORAL_TABLET | Freq: Two times a day (BID) | ORAL | Status: DC
Start: 2023-01-05 — End: 2023-01-06
  Administered 2023-01-05: 1 via ORAL
  Filled 2023-01-05: qty 1

## 2023-01-05 MED ORDER — CEPHALEXIN 500 MG OR CAPS
500.0000 mg | ORAL_CAPSULE | Freq: Four times a day (QID) | ORAL | Status: DC
Start: 2023-01-05 — End: 2023-01-06
  Administered 2023-01-05 (×2): 500 mg via ORAL
  Filled 2023-01-05 (×2): qty 1

## 2023-01-05 MED ORDER — MUPIROCIN 2 % EX OINT
1.0000 | TOPICAL_OINTMENT | Freq: Two times a day (BID) | CUTANEOUS | Status: DC
Start: 2023-01-05 — End: 2023-01-06
  Administered 2023-01-05: 1 via NASAL
  Filled 2023-01-05 (×2): qty 1

## 2023-01-05 NOTE — Interdisciplinary (Addendum)
01/05/23 0900   Assessment   Assessment Type Progress/Follow-up;Discharge   Referral Information   Consult Type Homeless;Community Resources/Referrals       Weekend CSW responded to handoff from CSW Raynelle Fanning re: Pt may need Recup Care referral if pt agreeable. Pt has multiple wounds/lesions to right elbow, right forearm. Currently pending dermatology consult and recommendations, currently on IV antibiotics. Unsure if IV will be required at discharge. Pending blood and wound cultures.     CSW consulted with NP de Allena Katz. Dermatology did a biopsy, pending results,some studies still pending as well. Antibiotic therapy will be contingent on who shows up in cultures. NP inquiring if we can arrange recup then send him likely at a later time like Monday. NP can't speak as far as what antibiotics to give just yet, goal is oraak.    CSW to initiate recuperative care referral once requested RN documentation in place.     0981- RN note placed. CSW emailed [facility] Recup Care referral to [email or web address] and [email or web address] for review.     CSW received emailed response from [email or web address] indicating patient is pending Psychiatric notes (SI), HH?, and Meds on hand for acceptance. Recup additionally emailed this Clinical research associate a photo and requested to know if pt matches the pictures description.     CSW presented to pt's bedside and introduced self/role. CSW provided pt with information/education regarding recuperative care. CSW answered pt's questions regarding recup. Per pt he has never been enrolled in an [employer] program but endorsed he would be receptive to referral. Pt requesting hospital letter of attestation to provide to employer. CSW drafted letter in communications tab and provided pt with a physical copy at bedside. CSW provided email response back to [facility] Recup Care photo provided does not match pt's description and pt reported that he has never been enrolled in an IF  program.     NP updated. CSW to handoff to next shift CSWs as patient is still pending med clearance at this time.     DCP: IF Recup Care with [facility] [employer] pending acceptance and med clearance.     Budd Palmer, MSW  Clinical Social Worker II

## 2023-01-05 NOTE — Plan of Care (Signed)
Problem: Promotion of health and safety  Goal: Promotion of Health and Safety  Description: The patient remains safe, receives appropriate treatment and achieves optimal outcomes (physically, psychosocially, and spiritually) within the limitations of the disease process by discharge.  Outcome: Progressing  Flowsheets (Taken 01/05/2023 0320)  Standard of Care/Policy:   Medical/Surgical   Pain  Outcome Evaluation (rationale for progressing/not progressing) every shift: v/s stable. no event overnight  Note: Nursing Shift Summary    No data found.  No data found.    Overall Mobility/Safe Patient Handling  Progressive Mobility Level: 7  BMAT Score/Safe Patient Handling: BMAT 4- independent OR supervision for fall risk  Assistive Device: None  Walk in Room: Independent  Walk to/ from bathroom: Independent  Turn in Bed: Independent                   No data found.  No data found.

## 2023-01-05 NOTE — Plan of Care (Signed)
Problem: Promotion of health and safety  Goal: Promotion of Health and Safety  Description: The patient remains safe, receives appropriate treatment and achieves optimal outcomes (physically, psychosocially, and spiritually) within the limitations of the disease process by discharge.  Outcome: Progressing  Flowsheets (Taken 01/05/2023 1546)  Standard of Care/Policy:   Medical/Surgical   Pain   Skin   Falls Reduction  Outcome Evaluation (rationale for progressing/not progressing) every shift: VS stable. A.O x 4, pt is showing mild withdrawed s.s and refued , or denied subtex. pt stated he wil let nurse know if needs the meds. CM is working on pt acceptance by re cup care  Patient /Family stated Goal: get some rest and prevent agitation  Individualized Interventions/Recommendations (Discharge Readiness): monitor withdraw symptoms monitor pain  Individualized Interventions/Recommendations (Skin/Comfort/Safety/Mobility): encourage mobility in and out of the bed and as tolerated. keep skin intact, clean and dry, provide emotional support and rouding hourly, fall preventions  Individualized Interventions/Recommendations (Knowledge): Keep pt updated with plan of care  Individualized Interventions/Recommendations: cooridnate with CM  Individualized Interventions/Recommendations: update plan of care   Nursing Shift Summary  Patient is A. O x 4. Refused subtex. And stated he feel okay at this time, and asked tylenol. Cont monitor vss. And cont implement fall prevention   No data found.  No data found.    Overall Mobility/Safe Patient Handling  BMAT Score/Safe Patient Handling: BMAT 4- independent OR supervision for fall risk  Walk in Room: Independent  Walk to/ from bathroom: Independent                   Rounding for the past 12 hrs:   Provider Rounding   01/05/23 0900 I, or another nurse, joined the provider team during patient bedside rounds that included, as applicable, the patient.     No data found.

## 2023-01-05 NOTE — ED Notes (Addendum)
Pt ambulates more than 100 ft in the hall. He's also independent with ADLs.

## 2023-01-05 NOTE — Discharge Summary (Shared)
DISCHARGE SUMMARY     Patient Name:  Victor Huffman    Date of Admission:  01/03/2023  Date of Discharge:  01/05/23  Discharge Disposition:  {DC SUMM NGEXB:28413}     Principal Diagnosis:  Purulent SSTI without sepsis     Follow Up Recommendations:      Medications:  - Please continue your home medications as prescribed    Primary Care Provider:   Please follow-up with your primary care provider within 1-3 days of discharge from the hospital so they may closely evaluate you after your recent hospitalization. Please have your primary care provider review all final diagnostic results performed during your hospitalization. You may follow up with a primary care provider located at [facility], please call [phone] to schedule an appointment as recommended.    Return Precautions:  Please return to the emergency room with any new/worsening symptoms    - Please see your upcoming future appointments below    Follow Up Appointments:  Scheduled appointments:  {Fire This Link Only When Discharge Summary is Ready for Final Signature:13194}  - You will be called in regards to future appointments  - If you do not receive a call for an appointment within 10 days, please call [phone] to schedule an appointment        Discharge Medications:  {Use this link ONLY when discharge medication documentation has been completed:21117}    Hospital Problem List:  Patient Active Problem List   Diagnosis    Suicidal ideations    Cocaine abuse, uncomplicated (CMS-HCC)    Mild fentanyl abuse (CMS-HCC)    Cellulitis    Soft tissue infection             Reason for Admission to the Hospital / History of Present Illness:   This is a 42 year old male who presents with right elbow wound beginning with a sore about 1 week ago. Pt states another sore developed next to the 1st one on the elbow which drained pus yesterday. Pt states he then developed more sores on his right wrist and right eyebrow about 3-4 days ago that scabbed over. Pt reports  redness, swelling, and pain at his right elbow for the last x 2 days.  Endorses sweats, subjective fevers, fatigue, and chills. Denies chest pain, SOB, changes in appetite, and n/v.      In the ER: significant diagnotic's - CRP 3.3, WBC 11.3, sed rate 20, right elbow x-ray-IMPRESSION:Focal skin irregularity at the soft tissues just medial to the elbow joint, which may be related to reported skin wound with superimposed adjacent soft tissue swelling versus skin tethering to the medial epicondyle of the distal humerus in a setting of surrounding soft tissue swelling. No radiographic evidence of gross subcutaneous gas or osseous erosions suggestive for osteomyelitis. Pt was given - Vanco, 1L LR bolus.      Hospital Course by Problem:       #Purulent SSTI on multiple locations without sepsis.   #Multiple lesions to right elbow, right forearm, elbow- Ddx MRSA vs disseminated fungal infection,  Acute generalized exanthematous pustulosis, pustular psoriasis   Dermatology consulted:  - Favor ecthyma clinically, most commonly 2/2 staph or Group A strep but will perform biopsy with tissue culture to eval for less common infections. Please see brief procedure note below  - Follow up dermpath and tissue culture results   - Agree with wound swab, follow culture results  - Agree with blood cultures, HIV, quant gold, cocci and other  fungal serologies; follow results  - Called primary team to relay our plan  - abx per the primary team  - Sutures will need to be removed in 14 days (01/18/2023), any provider can do this. Please cover with vaseline and a bandage daily until suture removal.    # Polysubstance use disorder - smokes Fentanyl and meth, uses cocaine. Pt interested in   # Tobacco dependence- smokes a vape equivalent to 1 PPD  # Homelessness, lives in car     Chronic   # SCC left neck -s/p resection   # IVDU-last use x 1 year ago  # Hepatitis C- s/p Harvoni   # Depression                     ---------------------------------------------------------------------------------------------------------------------    Additional Hospital Diagnoses ("rule out" or "suspected" diagnoses, etc.):  None       Consultations Obtained During This Hospitalization:  Dermatology        Principal Procedure During This Hospitalization:  CTA Rt Upper Extremity    Result Date: 01/04/2023  1.  No CT imaging findings suggestive of acute osteomyelitis. 2.  Posterior right elbow skin and subdermal thickening with adjacent subcutaneous edema and fat stranding of medial and lateral distal right upper arm, elbow, and proximal forearm, may reflect nonspecific cellulitis. No drainable focal fluid collection or subcutaneous gas. 3.  Widely patent right upper extremity arteries without abnormality. I have personally reviewed the images upon which this report is based and agree with the findings and conclusions expressed above.    X-Ray Chest Single View    Result Date: 01/04/2023  FINDINGS/ HEART AND MEDIASTINUM: Within normal limits. LUNGS: Clear. PLEURA: No effusion. Medical devices: None.  1.  No evidence of acute cardiopulmonary disease. No active TB.     X-Ray Elbow Complete Min 3 Vws - Right    Result Date: 01/03/2023  Focal skin irregularity at the soft tissues just medial to the elbow joint, which may be related to reported skin wound with superimposed adjacent soft tissue swelling versus skin tethering to the medial epicondyle of the distal humerus in a setting of surrounding soft tissue swelling. No radiographic evidence of gross subcutaneous gas or osseous erosions suggestive for osteomyelitis. I have personally reviewed the images upon which this report is based and agree with the findings and conclusions expressed above.      Microbiology Cultures:  Microbiology Results (last 7 days)       Procedure Component Value - Date/Time    Anaerobic Culture [[id number]] Collected: 01/04/23 1302    Lab Status: Preliminary  result Updated: 01/05/23 0030     Description TISSUE ELBOW,LEFT     Special Info NONE     Gram Stain --     RARE  RED BLOOD CELLS       Gram Stain NO ORGANISMS SEEN     Culture Result --    Bacterial Culture and Gram Stain Sterile Container Ankle, Left [[id number]] Collected: 01/04/23 1147    Lab Status: Preliminary result Specimen: Abscess from Ankle, Left Updated: 01/05/23 0032     Description ABSCESS ANKLE,LEFT     Special Info NONE     Gram Stain NO CELLS SEEN     Gram Stain NO ORGANISMS SEEN     Culture Result --    MRSA Screen/Culture [[id number]]  (Abnormal) Collected: 01/03/23 1655    Lab Status: Final result Specimen: Nares Updated: 01/04/23 2158     Description NARES  Special Info NONE     Culture Result FEW METHICILLIN RESISTANT STAPHYLOCOCCUS AUREUS (MRSA)      Patient education required    COVID-19/Flu A B/RSV Panel [[id number]] Collected: 01/03/23 1558    Lab Status: Final result Specimen: Swab from Nasal-Pharyngeal Updated: 01/03/23 1803     Respiratory Virus Source SWAB     Comment: NASOPHARYNX        COVID-19 Result NOT DETECTED     Influenza A, PCR NOT DETECTED     Influenza B, PCR NOT DETECTED     Respiratory Syncytial Virus PCR NOT DETECTED     Respiratory Virus Comment (NOTE)     Comment: Reference range: Not Detected    An interpretation of Not Detected cannot exclude the presence of  specific nucleic acid in concentrations below detection limits. The  Xpert Xpress SARS-CoV-2/Flu/RSV plus is a multiplexed real-time reverse  transcription polymerase chain reaction (RT-PCR) intended for the  qualitative detection and differentiation of SARS-CoV-2, influenza A,  influenza B and respiratory syncytial virus (RSV) viral RNA.    This test has not been validated for use in asymptomatic individuals.  These specimens may have decreased sensitivity, so caution should be  exercised when interpreting not detected results. Recent patient  exposure to FluMist or other live attenuated influenza vaccines  may  cause inaccurate positive results.    This test has been authorized by the Food and Drug Administration (FDA)  under an Emergency Use Authorization (EUA) for use by laboratories  certified to perform moderate and high complexity testing under the  Clinical Laboratory Improvement Amendments (CLIA).         Blood Culture Blood Culture Set [[id number]] Collected: 01/03/23 0930    Lab Status: Preliminary result Specimen: Blood Updated: 01/04/23 0732     Description BLOOD ARM,LEFT     Special Info NONE     Culture Result NO GROWTH 1 DAY    Blood Culture Blood Culture Set [[id number]] Collected: 01/03/23 0930    Lab Status: Preliminary result Specimen: Blood Updated: 01/04/23 0732     Description BLOOD ARM,RIGHT     Special Info NONE     Culture Result NO GROWTH 1 DAY                  Hospital Events:  As Above      Tests Outstanding at Discharge Requiring Follow Up:  {DC SUMM OUTSTANDING TESTS:13171}    Discharge Condition:  Stable.      Key Physical Exam Findings at Discharge:  No significant physical examination findings at the time of discharge.     01/04/23  0740 01/04/23  1530 01/04/23  1939 01/05/23  0513   BP: 124/78 95/71 120/86 118/81   Pulse: 57 77 86 76   Temp: 98.1 F (36.7 C) 98.1 F (36.7 C) 97 F (36.1 C) 96.6 F (35.9 C)   Resp: 16 16 18 18    SpO2: 99% 98% 99% 99%       ***Lungs:  CTA (B)     Heart:  regular, S1 S2, no murmurs  Abdomen:  soft, non-tender, normo-active bowel sounds  Extremities:  2+ pulses, no edema    Discharge Diet:   Diet Order Regular       Allergies:  Allergies   Allergen Reactions    Ceclor [Cefaclor] Rash     When he was a child       MDRO Status: {A member of the primary treatment team must disclose and discuss a positive  MRSA status with the pt or their representative. A discharge MRSA screen is required per [place] law for patients who have been in an ICU and have a negative MRSA status.:20017}    Discharge Code Status:  Full code / full care  This code status is not changed  from the time of admission.    Advance Directive on File?  No    For appointments requested for after discharge that have not yet been scheduled, refer to the Post Discharge Referrals section of the After Visit Summary.    Discharging Physician's Contact Information:   Discharging Physician's Contact Information: [facility] at [phone]      Thank you for allowing me to partake in your care during your hospital stay.    ***, NP  Internal Medicine    I attest that I have advised the patient that I am not a physician or surgeon and that I am a nurse practitioner. I have also advised the patient that they may request and will be allowed to see a physician. In addition, I have advised the patient that in the event of any change in their condition outside of my education and training, they will be referred to a physician.

## 2023-01-05 NOTE — Progress Notes (Signed)
INTERNAL MEDICINE INPATIENT PROGRESS NOTE - [facility] MEDICINE     Date of Evaluation: 01/05/2023     Service: Medicine Hospitalist Team O     Subjective:     "I feel good. Better for the most part. I had #2 yesterday. Im starting to have withdrawal symptoms but I dont want to start Subutex just yet"    Review of Systems:  - At least 2 systems were reviewed and are negative unless otherwise stated above.      Objective:     Vital Signs:  Ht.: Height: 5\' 8"  (172.7 cm),     BMI: Body mass index is 25.98 kg/m.  Temperature:  [96.6 F (35.9 C)-98.1 F (36.7 C)] 97.3 F (36.3 C) (09/21 0811)  Blood pressure (BP): (95-128)/(71-89) 128/89 (09/21 0811)  Heart Rate:  [76-93] 93 (09/21 0811)  Respirations:  [16-18] 18 (09/21 0513)  Pain Score: 0 (09/21 1058)  O2 Device: None (Room air) (09/21 0513)  SpO2:  [98 %-99 %] 98 % (09/21 0811)    Physical Exam:  General:  male NAD  HEENT:  PERRL, EOMI, clear oropharynx  Neck:   supple, normal carotid pulse, no bruits, no JVD, no lymphadenopathy  Chest/Breast:  symmetric  Lungs:  CTA (B)     Heart:  regular, S1 S2, no murmurs  Abdomen:  soft, non distended, non-tender, normo active bowel sounds  GU/Rectal:  deferred   Extremities:  2+ pulses, no edema  Neurological:  alert and oriented x 4, motor 5/5, sensory grossly intact   Skin:  Right elbow lesions with scabs x2, inner elbow warmth, redness, and swelling. Scabbed lesions to right wrist and right eyebrow.      Diagnostic Data:     Common Laboratory Data:    BMP  139 (09/21) 103 (09/21) 16 (09/21) 89 (09/21)   4.4 (09/21) 27 (09/21) 0.9 (09/21)        LFT  7.1 (09/19) 10 (09/19) 0.5 (09/19) 52 (09/19)   4.1* (09/19) 12* (09/19)          CBC  8.4 (09/21) 13.7 (09/21) 357 (09/21)    39.4* (09/21)       Assessment & Plan     This is a 42 year old male presenting with multiple lesions and warmth, swelling, and redness of right elbow and findings of multiple lesions who is being admitted for IV antibiotics and further  infectious work-up.     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 S/p biopsy 9/20  #Multiple lesions to right elbow, right forearm, elbow- Ddx MRSA vs disseminated fungal infection,  Acute generalized exanthematous pustulosis, pustular psoriasis   # Polysubstance use disorder - smokes Fentanyl and meth, uses cocaine. Pt interested in   # Tobacco dependence- smokes a vape equivalent to 1 PPD  # Homelessness, lives in car     Chronic   # SCC left neck -s/p resection   # IVDU-last use x 1 year ago  # Hepatitis C- s/p Harvoni   # Depression     -Dermatology following.Recommendations reviewed and noted  -Antibiotic therapy: Ceftriaxone and Vancomycin ( end 9/21), transitioned to oral Keflex and Bactrim today 9/21( total of 10 days)  -Follow-up cultures: blood culture, cocci, aerobic culture of wound  - CSW for homelessness  - avoid opoid, once pt in withdrawals interested in starting Subutex     DVT PPx:    Code Status: Orders Placed This Encounter      Full Code /  Full resuscitative therapy / full diagnostic & therapeutic care       Overall plan:     As noted above.     Kary Kos, NP-C  Internal Medicine    I attest that I have advised the patient that I am not a physician or surgeon and that I am a nurse practitioner. I have also advised the patient that they may request and will be allowed to see a physician. In addition, I have advised the patient that in the event of any change in their condition outside of my education and training, they will be referred to a physician.        Selected Medications:  SCHEDULED MEDS:   cephALEXin  500 mg 4x Daily    mupirocin  1 Application. BID    Followed by    Melene Muller ON 01/19/2023] mupirocin  1 Application. BID    Followed by    Melene Muller ON 02/02/2023] mupirocin  1 Application. BID    nicotine  1 patch Daily    sulfamethoxazole-trimethoprim  1 tablet Q12H       PRN MEDS:   acetaminophen  650 mg Q4H PRN    Or    acetaminophen  650 mg Q4H PRN     buprenorphine  8 mg Once PRN    cloNIDine  0.1 mg Q8H PRN    ketOROLAC  15 mg Q6H PRN    nalOXone  0.1 mg Q2 Min PRN    ondansetron  4 mg Q6H PRN    Or    ondansetron  4 mg Q6H PRN    perflutren lipid microspheres  0.4 mL PRN    sodium chloride  10 mL PRN    sodium chloride  10 mL PRN    sodium chloride  10 mL Once PRN    sodium chloride  50 mL Once PRN        Attending Attestation   ATTENDING ATTESTATION:  I saw and evaluated the patient with the APP. I provided a substantive portion of the care of this patient.  I have reviewed and verified this documentation which accurately reflects our care.  I have independently performed the assessment and plan and documented it within the note.    Tania Ade, DO     Required Daily Device Assessment:  This patient does not have a documented central line or urinary catheter.

## 2023-01-06 DIAGNOSIS — Z5320 Procedure and treatment not carried out because of patient's decision for unspecified reasons: Secondary | ICD-10-CM

## 2023-01-06 DIAGNOSIS — L089 Local infection of the skin and subcutaneous tissue, unspecified: Secondary | ICD-10-CM

## 2023-01-06 DIAGNOSIS — F1190 Opioid use, unspecified, uncomplicated: Secondary | ICD-10-CM

## 2023-01-06 LAB — CBC WITH DIFF, BLOOD
ANC automated: 7.8 10*3/uL (ref 2.0–8.1)
Basophils %: 1.1 %
Basophils Absolute: 0.1 10*3/uL (ref 0.0–0.2)
Eosinophils %: 2.9 %
Eosinophils Absolute: 0.3 10*3/uL (ref 0.0–0.5)
Hematocrit: 42.9 % (ref 39.5–50.0)
Hgb: 15 G/DL (ref 13.5–16.9)
Lymphocytes %: 18.3 %
Lymphocytes Absolute: 2 10*3/uL (ref 0.9–3.3)
MCH: 31.5 PG (ref 27.0–33.5)
MCHC: 35 G/DL (ref 32.0–35.5)
MCV: 90 FL (ref 81.5–97.0)
MPV: 7.3 FL (ref 7.2–11.7)
Monocytes %: 6.2 %
Monocytes Absolute: 0.7 10*3/uL (ref 0.0–0.8)
Neutrophils % (A): 71.5 %
PLT Count: 414 10*3/uL — ABNORMAL HIGH (ref 150–400)
RBC: 4.77 10*6/uL (ref 4.38–5.62)
RDW-CV: 13 % (ref 11.6–14.4)
White Bld Cell Count: 10.9 10*3/uL — ABNORMAL HIGH (ref 4.0–10.5)

## 2023-01-06 LAB — QUANTIFERON-TB GOLD PLUS, 4-TUBE
QuantiFERON  NIL: 0 IU/mL
QuantiFERON MITOGEN minus NIL: 10 IU/mL
QuantiFERON TB1 minus NIL: 0.03 IU/mL
QuantiFERON TB2 minus NIL: 0.02 IU/mL
QuantiFERON-TB Gold Plus, Result: NEGATIVE

## 2023-01-06 LAB — BASIC METABOLIC PANEL, BLOOD
BUN: 12 mg/dL (ref 7–25)
CO2: 25 mmol/L (ref 21–31)
Calcium: 9.3 mg/dL (ref 8.6–10.3)
Chloride: 99 mmol/L (ref 98–107)
Creat: 0.9 mg/dL (ref 0.7–1.3)
Electrolyte Balance: 9 mmol/L (ref 2–12)
Glucose: 113 mg/dL (ref 70–115)
Potassium: 4 mmol/L (ref 3.5–5.1)
Sodium: 133 mmol/L — ABNORMAL LOW (ref 136–145)
eGFR - high estimate: >60 (ref >59)
eGFR - low estimate: >60 (ref >59)

## 2023-01-06 LAB — ANAEROBIC CULTURE: Gram Stain: NONE SEEN

## 2023-01-06 LAB — SYPHILIS EIA SCREEN, BLOOD
T. Pallidum Ab: NONREACTIVE Index
T. Pallidum Ab: NOT DETECTED Index

## 2023-01-06 LAB — C-REACTIVE PROTEIN, BLOOD: C Reactive Protein: 0.9 MG/DL (ref 0.0–1.0)

## 2023-01-06 LAB — SED RATE, BLOOD: Sedimentation Rate: 18 MM/HR — ABNORMAL HIGH (ref 0–15)

## 2023-01-06 NOTE — Discharge Summary (Addendum)
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 Please have your primary care provider review all final diagnostic results performed during your hospitalization. You may follow up with a primary care provider located at [facility], please call [phone] to schedule an appointment as recommended.    Return Precautions:  Please return to the emergency room with any new/worsening symptoms    - Please see your upcoming future appointments below    Follow Up Appointments:  Scheduled appointments:  No future appointments.  - You will be called in regards to future appointments  - If you do not receive a call for an appointment within 10 days, please call [phone] to schedule an appointment        Discharge Medications:     What To Do With Your Medications      You have not been prescribed any medications.         Hospital Problem List:  Patient Active Problem List   Diagnosis    Suicidal ideations    Cocaine abuse, uncomplicated (CMS-HCC)    Mild fentanyl abuse (CMS-HCC)    Cellulitis    Soft tissue infection             Reason for Admission to the Hospital / History of Present Illness:   This is a 42 year old male who presents with right elbow wound beginning with a sore about 1 week ago. Pt states another sore developed next to the 1st one on the elbow which drained pus yesterday. Pt states he then developed more sores on his right wrist and right eyebrow about 3-4 days ago that scabbed over. Pt reports  redness, swelling, and pain at his right elbow for the last x 2 days.  Endorses sweats, subjective fevers, fatigue, and chills. Denies chest pain, SOB, changes in appetite, and n/v.      In the ER: significant diagnotic's - CRP 3.3, WBC 11.3, sed rate 20, right elbow x-ray-IMPRESSION:Focal skin irregularity at the soft tissues just medial to the elbow joint, which may be related to reported skin wound with superimposed adjacent soft tissue swelling versus skin tethering to the medial epicondyle of the distal humerus in a setting of surrounding soft tissue swelling. No radiographic evidence of gross subcutaneous gas or osseous erosions suggestive for osteomyelitis. Pt was given - Vanco, 1L LR bolus.       Hospital Course by Problem:   Received message from RN saying patient eloped in a hurry from ED [facility], she was able to get the IV out prior to him eloping. However unable to discuss with him the risks of leaving AMA/elopement as we still had cultures to follow-up with and wound care for his purulent SSTI on his arms.      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 S/p biopsy 9/20  #Multiple lesions to right elbow, right forearm, elbow- Ddx MRSA vs disseminated fungal infection,  Acute generalized  exanthematous pustulosis, pustular psoriasis   # Polysubstance use disorder - smokes Fentanyl and meth, uses cocaine. Pt interested in   # Tobacco dependence- smokes a vape equivalent to 1 PPD  # Homelessness, lives in car     Chronic   # SCC left neck -s/p resection   # IVDU-last use x 1 year ago  # Hepatitis C- s/p Harvoni   # Depression      -Dermatology following.Recommendations reviewed and noted  -Antibiotic therapy: Ceftriaxone and Vancomycin ( end 9/21), transitioned to oral Keflex and Bactrim today 9/21( total of 10 days)  -Follow-up cultures: blood culture, cocci, aerobic culture of wound  - CSW for homelessness  - avoid opoid, once pt in withdrawals interested in starting Subutex                ---------------------------------------------------------------------------------------------------------------------    Additional Hospital Diagnoses ("rule out" or "suspected" diagnoses, etc.):  None       Consultations Obtained During This Hospitalization:  Dermatology        Principal Procedure During This Hospitalization:  CTA Rt Upper Extremity    Result Date: 01/04/2023  1.  No CT imaging findings suggestive of acute osteomyelitis. 2.  Posterior right elbow skin and subdermal thickening with adjacent subcutaneous edema and fat stranding of medial and lateral distal right upper arm, elbow, and proximal forearm, may reflect nonspecific cellulitis. No drainable focal fluid collection or subcutaneous gas. 3.  Widely patent right upper extremity arteries without abnormality. I have personally reviewed the images upon which this report is based and agree with the findings and conclusions expressed above.    X-Ray Chest Single View    Result Date: 01/04/2023  FINDINGS/ HEART AND MEDIASTINUM: Within normal limits. LUNGS: Clear. PLEURA: No effusion. Medical devices: None.  1.  No evidence of acute cardiopulmonary disease. No active TB.     X-Ray Elbow Complete Min 3 Vws - Right    Result Date: 01/03/2023  Focal skin irregularity at the soft tissues just medial to the elbow joint, which may be related to reported skin wound with superimposed adjacent soft tissue swelling versus skin tethering to the medial epicondyle of the distal humerus in a setting of surrounding soft tissue swelling. No radiographic evidence of gross subcutaneous gas or osseous erosions suggestive for osteomyelitis. I have personally reviewed the images upon which this report is based and agree with the findings and conclusions expressed above.      Microbiology Cultures:  Microbiology Results (last 7 days)       Procedure Component Value - Date/Time    Fungal Culture/DFE Sterile screw-capped tube Biopsy [[id number]] Collected: 01/04/23  1302    Lab Status: Preliminary result Specimen: Biopsy Updated: 01/05/23 1100     Description TISSUE ELBOW,LEFT     Special Info NONE     Direct Fungal Exam NO FUNGAL STRUCTURES SEEN     Culture Result --    Acid-Fast Bacillus (AFB) Culture and AFB Stain Sterile Container Biopsy [[id number]] Collected: 01/04/23 1302    Lab Status: Preliminary result Specimen: Tissue from Biopsy Updated: 01/05/23 1258     Description TISSUE ELBOW,LEFT     Special Info NONE     Acid Fast Stain NO ACID FAST BACILLI FOUND ON CONCENTRATED SMEAR     Culture Result --    Anaerobic Culture [[id number]]  (Abnormal) Collected: 01/04/23 1302    Lab Status: Preliminary result Updated: 01/05/23 1722     Description TISSUE ELBOW,LEFT     Special Info NONE  Gram Stain --     RARE  RED BLOOD CELLS       Gram Stain NO ORGANISMS SEEN     Culture Result FEW STAPHYLOCOCCUS AUREUS      ADDITIONAL INFORMATION TO FOLLOW    Bacterial Culture and Gram Stain Sterile Container Ankle, Left [[id number]]  (Abnormal) Collected: 01/04/23 1147    Lab Status: Preliminary result Specimen: Abscess from Ankle, Left Updated: 01/05/23 1508     Description ABSCESS ANKLE,LEFT     Special Info NONE     Gram Stain NO CELLS SEEN     Gram Stain NO ORGANISMS SEEN     Culture Result 1+ STREPTOCOCCUS PYOGENES (GROUP A STREP)      1+ STAPHYLOCOCCUS AUREUS      ADDITIONAL INFORMATION TO FOLLOW    MRSA Screen/Culture [[id number]]  (Abnormal) Collected: 01/03/23 1655    Lab Status: Final result Specimen: Nares Updated: 01/04/23 2158     Description NARES     Special Info NONE     Culture Result FEW METHICILLIN RESISTANT STAPHYLOCOCCUS AUREUS (MRSA)      Patient education required    COVID-19/Flu A B/RSV Panel [[id number]] Collected: 01/03/23 1558    Lab Status: Final result Specimen: Swab from Nasal-Pharyngeal Updated: 01/03/23 1803     Respiratory Virus Source SWAB     Comment: NASOPHARYNX        COVID-19 Result NOT DETECTED     Influenza A, PCR NOT DETECTED     Influenza B, PCR NOT  DETECTED     Respiratory Syncytial Virus PCR NOT DETECTED     Respiratory Virus Comment (NOTE)     Comment: Reference range: Not Detected    An interpretation of Not Detected cannot exclude the presence of  specific nucleic acid in concentrations below detection limits. The  Xpert Xpress SARS-CoV-2/Flu/RSV plus is a multiplexed real-time reverse  transcription polymerase chain reaction (RT-PCR) intended for the  qualitative detection and differentiation of SARS-CoV-2, influenza A,  influenza B and respiratory syncytial virus (RSV) viral RNA.    This test has not been validated for use in asymptomatic individuals.  These specimens may have decreased sensitivity, so caution should be  exercised when interpreting not detected results. Recent patient  exposure to FluMist or other live attenuated influenza vaccines may  cause inaccurate positive results.    This test has been authorized by the Food and Drug Administration (FDA)  under an Emergency Use Authorization (EUA) for use by laboratories  certified to perform moderate and high complexity testing under the  Clinical Laboratory Improvement Amendments (CLIA).         Blood Culture Blood Culture Set [[id number]] Collected: 01/03/23 0930    Lab Status: Preliminary result Specimen: Blood Updated: 01/05/23 0734     Description BLOOD ARM,LEFT     Special Info NONE     Culture Result NO GROWTH 2 DAYS    Blood Culture Blood Culture Set [[id number]] Collected: 01/03/23 0930    Lab Status: Preliminary result Specimen: Blood Updated: 01/05/23 0734     Description BLOOD ARM,RIGHT     Special Info NONE     Culture Result NO GROWTH 2 DAYS                  Hospital Events:  As Above      Tests Outstanding at Discharge Requiring Follow Up:  None  Culture of blood, cocci, aerobic culture of wound    Discharge Condition:   left AMA, but stable condition otherwise  afebrile with prior stable vitals  .      Key Physical Exam Findings at Discharge:  No significant physical examination  findings at the time of discharge.     01/05/23  1632 01/05/23  1955 01/05/23  2118 01/06/23  0326   BP: 123/79 108/70  109/79   Pulse: 82 91  81   Temp: 97.4 F (36.3 C) 101.3 F (38.5 C) 99 F (37.2 C) 97.9 F (36.6 C)   Resp: 18 17  19    SpO2: 95% 99%  96%       General:  male NAD  HEENT:  PERRL, EOMI, clear oropharynx  Neck:   supple, normal carotid pulse, no bruits, no JVD, no lymphadenopathy  Chest/Breast:  symmetric  Lungs:  CTA (B)     Heart:  regular, S1 S2, no murmurs  Abdomen:  soft, non distended, non-tender, normo active bowel sounds  GU/Rectal:  deferred   Extremities:  2+ pulses, no edema  Neurological:  alert and oriented x 4, motor 5/5, sensory grossly intact   Skin:  Right elbow lesions with scabs x2, inner elbow warmth, redness, and swelling. Scabbed lesions to right wrist and right eyebrow.     Discharge Diet:   Diet Order Regular       Allergies:  Allergies   Allergen Reactions    Ceclor [Cefaclor] Rash     When he was a child       MDRO Status: I have discussed the patient's positive MRSA status with the patient    Discharge Code Status:  Full code / full care  This code status is not changed from the time of admission.    Advance Directive on File?  No    For appointments requested for after discharge that have not yet been scheduled, refer to the Post Discharge Referrals section of the After Visit Summary.    Discharging Physician's Contact Information:   Discharging Physician's Contact Information: [facility] at [phone]      Thank you for allowing me to partake in your care during your hospital stay.    Geoffry Paradise, AG-ACNP-BC  Internal Medicine    I attest that I have advised the patient that I am not a physician or surgeon and that I am a nurse practitioner. I have also advised the patient that they may request and will be allowed to see a physician. In addition, I have advised the patient that in the event of any change in their condition outside of my education  and training, they will be referred to a physician.       ATTENDING ATTESTATION:  I have reviewed the documentation and pertinent results and agree with the assessment and plan. I am the supervising Physician . Patient eloped from the hospital prior to attending evaluation. Attempted to call the patient to send abx to preferred pharmacy, however,  number listed in chart which is not in service. Attempted to call patients mother as listed in social work note with no answer x 2 attempts     Tania Ade, DO  [facility] Assistant Clinical Professor  Department of Medicine

## 2023-01-07 LAB — BODY SITE CULTURE W/GRAM STAIN, AEROBIC ONLY
Gram Stain: NONE SEEN
Gram Stain: NONE SEEN

## 2023-01-07 LAB — HISTOPLASMA AG EIA, SERUM
Histoplasma Ant Srm: NOT DETECTED
Histoplasma EIA Antigen Serum: NOT DETECTED

## 2023-01-07 LAB — COCCIDIOIDES SCREEN, SERUM
Coccidioides Ab IgG, EIA: 0.563 (ref <1.5)
Coccidioides Ab IgM, EIA: 0.038 (ref <1.5)

## 2023-01-07 LAB — BLASTOMYCES ANTIGEN QUANTITATIVE BY EIA, URINE
Blastomyces Antigen, Urine: NOT DETECTED
Blastomyces dermatitidis EIA, Interp: NOT DETECTED

## 2023-01-08 LAB — BLOOD CULTURE
Culture Result: NO GROWTH
Culture Result: NO GROWTH

## 2023-01-09 LAB — DERM PATH - ~~LOC~~

## 2023-01-10 ENCOUNTER — Telehealth: Payer: Self-pay

## 2023-01-10 NOTE — Telephone Encounter (Signed)
Called patient to discuss dermatopathology results, but provider no longer in service. Patient does not have MyChart available.    Final Diagnosis      A. SKIN, RIGHT FOREARM, PUNCH BIOPSY  PERIVASCULAR AND INTERSTITIAL MIXED INFLAMMATORY INFILTRATE WITH MANY NEUTROPHILS, ULCERATED  This may represent the side of a folliculitis. The stage in the evolution of a neutrophilic dermatosis cannot be definitive excluded. PAS, GMS, Gram, and AFB stains failed to reveal microorganisms. Given the low sensitivity of special stains for infection, follow up with tissue cultures recommended.

## 2023-02-01 LAB — FUNGAL CULTURE/DFE: Direct Fungal Exam: NONE SEEN

## 2023-03-08 LAB — AFB CULTURE W/STAIN
